# Patient Record
Sex: Female | Born: 1940 | Race: Black or African American | Hispanic: No | Marital: Single | Smoking: Former smoker
Health system: Southern US, Community
[De-identification: ages and names within clinical notes are randomized; demographics above are authoritative.]

## PROBLEM LIST (undated history)

## (undated) DIAGNOSIS — I5022 Chronic systolic (congestive) heart failure: Secondary | ICD-10-CM

## (undated) DIAGNOSIS — I618 Other nontraumatic intracerebral hemorrhage: Secondary | ICD-10-CM

## (undated) DIAGNOSIS — Z95810 Presence of automatic (implantable) cardiac defibrillator: Secondary | ICD-10-CM

## (undated) DIAGNOSIS — I639 Cerebral infarction, unspecified: Secondary | ICD-10-CM

## (undated) DIAGNOSIS — E785 Hyperlipidemia, unspecified: Secondary | ICD-10-CM

## (undated) DIAGNOSIS — I428 Other cardiomyopathies: Secondary | ICD-10-CM

## (undated) DIAGNOSIS — I10 Essential (primary) hypertension: Secondary | ICD-10-CM

## (undated) DIAGNOSIS — M8580 Other specified disorders of bone density and structure, unspecified site: Secondary | ICD-10-CM

## (undated) DIAGNOSIS — F0390 Unspecified dementia without behavioral disturbance: Secondary | ICD-10-CM

## (undated) DIAGNOSIS — R51 Headache: Secondary | ICD-10-CM

## (undated) DIAGNOSIS — IMO0001 Reserved for inherently not codable concepts without codable children: Secondary | ICD-10-CM

## (undated) DIAGNOSIS — I219 Acute myocardial infarction, unspecified: Secondary | ICD-10-CM

## (undated) HISTORY — PX: ABDOMINAL HYSTERECTOMY: SHX81

## (undated) HISTORY — DX: Hyperlipidemia, unspecified: E78.5

## (undated) HISTORY — DX: Presence of automatic (implantable) cardiac defibrillator: Z95.810

## (undated) HISTORY — PX: CARDIAC CATHETERIZATION: SHX172

## (undated) HISTORY — PX: BACK SURGERY: SHX140

---

## 1989-01-01 DIAGNOSIS — I219 Acute myocardial infarction, unspecified: Secondary | ICD-10-CM

## 1989-01-01 HISTORY — DX: Acute myocardial infarction, unspecified: I21.9

## 1990-01-01 DIAGNOSIS — I618 Other nontraumatic intracerebral hemorrhage: Secondary | ICD-10-CM

## 1990-01-01 HISTORY — DX: Other nontraumatic intracerebral hemorrhage: I61.8

## 2001-12-21 ENCOUNTER — Inpatient Hospital Stay (HOSPITAL_COMMUNITY): Admission: AD | Admit: 2001-12-21 | Discharge: 2001-12-22 | Payer: Self-pay | Admitting: Cardiology

## 2001-12-21 HISTORY — PX: CARDIAC CATHETERIZATION: SHX172

## 2002-11-15 ENCOUNTER — Inpatient Hospital Stay (HOSPITAL_COMMUNITY): Admission: EM | Admit: 2002-11-15 | Discharge: 2002-11-19 | Payer: Self-pay | Admitting: *Deleted

## 2008-08-01 HISTORY — PX: NM MYOCAR PERF WALL MOTION: HXRAD629

## 2008-08-05 ENCOUNTER — Encounter (HOSPITAL_COMMUNITY): Admission: RE | Admit: 2008-08-05 | Discharge: 2008-11-03 | Payer: Self-pay | Admitting: Cardiovascular Disease

## 2008-09-16 ENCOUNTER — Inpatient Hospital Stay (HOSPITAL_COMMUNITY): Admission: EM | Admit: 2008-09-16 | Discharge: 2008-09-23 | Payer: Self-pay | Admitting: Emergency Medicine

## 2008-09-17 ENCOUNTER — Encounter (INDEPENDENT_AMBULATORY_CARE_PROVIDER_SITE_OTHER): Payer: Self-pay | Admitting: Internal Medicine

## 2008-09-21 HISTORY — PX: CARDIAC CATHETERIZATION: SHX172

## 2008-09-30 ENCOUNTER — Emergency Department (HOSPITAL_COMMUNITY): Admission: EM | Admit: 2008-09-30 | Discharge: 2008-09-30 | Payer: Self-pay | Admitting: Emergency Medicine

## 2008-11-29 ENCOUNTER — Emergency Department (HOSPITAL_COMMUNITY): Admission: EM | Admit: 2008-11-29 | Discharge: 2008-11-29 | Payer: Self-pay | Admitting: Emergency Medicine

## 2009-01-13 ENCOUNTER — Emergency Department (HOSPITAL_COMMUNITY): Admission: EM | Admit: 2009-01-13 | Discharge: 2009-01-13 | Payer: Self-pay | Admitting: Emergency Medicine

## 2009-07-26 ENCOUNTER — Encounter: Admission: RE | Admit: 2009-07-26 | Discharge: 2009-07-26 | Payer: Self-pay | Admitting: Internal Medicine

## 2010-01-22 ENCOUNTER — Encounter: Payer: Self-pay | Admitting: Internal Medicine

## 2010-01-23 ENCOUNTER — Encounter: Payer: Self-pay | Admitting: Cardiovascular Disease

## 2010-03-19 LAB — COMPREHENSIVE METABOLIC PANEL
ALT: 165 U/L — ABNORMAL HIGH (ref 0–35)
AST: 24 U/L (ref 0–37)
Albumin: 3.7 g/dL (ref 3.5–5.2)
Alkaline Phosphatase: 73 U/L (ref 39–117)
BUN: 10 mg/dL (ref 6–23)
CO2: 23 mEq/L (ref 19–32)
Calcium: 9.3 mg/dL (ref 8.4–10.5)
Chloride: 102 mEq/L (ref 96–112)
Creatinine, Ser: 0.77 mg/dL (ref 0.4–1.2)
GFR calc Af Amer: >60 mL/min (ref >60)
GFR calc non Af Amer: >60 mL/min (ref >60)
Glucose, Bld: 124 mg/dL — ABNORMAL HIGH (ref 70–99)
Potassium: 3.6 mEq/L (ref 3.5–5.1)
Sodium: 133 mEq/L — ABNORMAL LOW (ref 135–145)
Total Bilirubin: 0.7 mg/dL (ref 0.3–1.2)
Total Protein: 6.8 g/dL (ref 6.0–8.3)

## 2010-03-19 LAB — HEPATITIS PANEL, ACUTE
HCV Ab: NEGATIVE
Hep A IgM: NEGATIVE
Hep B C IgM: NEGATIVE
Hepatitis B Surface Ag: NEGATIVE

## 2010-03-19 LAB — PROTIME-INR
INR: 1.04 (ref 0.00–1.49)
Prothrombin Time: 13.5 seconds (ref 11.6–15.2)

## 2010-03-19 LAB — ACETAMINOPHEN LEVEL: Acetaminophen (Tylenol), Serum: <10 ug/mL — ABNORMAL LOW (ref 10–30)

## 2010-04-05 LAB — DIFFERENTIAL
Basophils Absolute: 0.1 10*3/uL (ref 0.0–0.1)
Basophils Relative: 2 % — ABNORMAL HIGH (ref 0–1)
Eosinophils Absolute: 0.1 10*3/uL (ref 0.0–0.7)
Eosinophils Relative: 2 % (ref 0–5)
Lymphocytes Relative: 34 % (ref 12–46)
Lymphs Abs: 1.9 10*3/uL (ref 0.7–4.0)
Monocytes Absolute: 0.5 10*3/uL (ref 0.1–1.0)
Monocytes Relative: 9 % (ref 3–12)
Neutro Abs: 3.1 10*3/uL (ref 1.7–7.7)
Neutrophils Relative %: 53 % (ref 43–77)

## 2010-04-05 LAB — COMPREHENSIVE METABOLIC PANEL
ALT: 13 U/L (ref 0–35)
AST: 17 U/L (ref 0–37)
Albumin: 3.9 g/dL (ref 3.5–5.2)
Alkaline Phosphatase: 61 U/L (ref 39–117)
BUN: 12 mg/dL (ref 6–23)
CO2: 28 mEq/L (ref 19–32)
Calcium: 9.8 mg/dL (ref 8.4–10.5)
Chloride: 101 mEq/L (ref 96–112)
Creatinine, Ser: 1.05 mg/dL (ref 0.4–1.2)
GFR calc Af Amer: >60 mL/min (ref >60)
GFR calc non Af Amer: 52 mL/min — ABNORMAL LOW (ref >60)
Glucose, Bld: 79 mg/dL (ref 70–99)
Potassium: 4.4 mEq/L (ref 3.5–5.1)
Sodium: 135 mEq/L (ref 135–145)
Total Bilirubin: 0.4 mg/dL (ref 0.3–1.2)
Total Protein: 7.2 g/dL (ref 6.0–8.3)

## 2010-04-05 LAB — GLUCOSE, CAPILLARY: Glucose-Capillary: 80 mg/dL (ref 70–99)

## 2010-04-05 LAB — POCT CARDIAC MARKERS
CKMB, poc: 1.5 ng/mL (ref 1.0–8.0)
CKMB, poc: 1.9 ng/mL (ref 1.0–8.0)
Myoglobin, poc: 64.9 ng/mL (ref 12–200)
Myoglobin, poc: 71.3 ng/mL (ref 12–200)
Troponin i, poc: <0.05 ng/mL (ref 0.00–0.09)
Troponin i, poc: <0.05 ng/mL (ref 0.00–0.09)

## 2010-04-05 LAB — CBC
HCT: 37.5 % (ref 36.0–46.0)
Hemoglobin: 12.4 g/dL (ref 12.0–15.0)
MCHC: 33.1 g/dL (ref 30.0–36.0)
MCV: 87.3 fL (ref 78.0–100.0)
Platelets: 232 10*3/uL (ref 150–400)
RBC: 4.3 MIL/uL (ref 3.87–5.11)
RDW: 14.3 % (ref 11.5–15.5)
WBC: 5.7 10*3/uL (ref 4.0–10.5)

## 2010-04-07 LAB — CBC
HCT: 35.5 % — ABNORMAL LOW (ref 36.0–46.0)
HCT: 36.4 % (ref 36.0–46.0)
HCT: 38 % (ref 36.0–46.0)
HCT: 41 % (ref 36.0–46.0)
Hemoglobin: 11.5 g/dL — ABNORMAL LOW (ref 12.0–15.0)
Hemoglobin: 12.5 g/dL (ref 12.0–15.0)
Hemoglobin: 13.2 g/dL (ref 12.0–15.0)
MCHC: 32.2 g/dL (ref 30.0–36.0)
MCHC: 32.2 g/dL (ref 30.0–36.0)
MCHC: 32.7 g/dL (ref 30.0–36.0)
MCV: 88 fL (ref 78.0–100.0)
MCV: 88.9 fL (ref 78.0–100.0)
MCV: 88.9 fL (ref 78.0–100.0)
MCV: 89.9 fL (ref 78.0–100.0)
Platelets: 190 10*3/uL (ref 150–400)
Platelets: 197 10*3/uL (ref 150–400)
Platelets: 241 10*3/uL (ref 150–400)
RBC: 3.97 MIL/uL (ref 3.87–5.11)
RBC: 4.52 MIL/uL (ref 3.87–5.11)
RBC: 4.56 MIL/uL (ref 3.87–5.11)
RBC: 4.6 MIL/uL (ref 3.87–5.11)
RDW: 15.6 % — ABNORMAL HIGH (ref 11.5–15.5)
RDW: 15.7 % — ABNORMAL HIGH (ref 11.5–15.5)
RDW: 16 % — ABNORMAL HIGH (ref 11.5–15.5)
WBC: 5.3 10*3/uL (ref 4.0–10.5)
WBC: 5.3 10*3/uL (ref 4.0–10.5)
WBC: 5.5 10*3/uL (ref 4.0–10.5)
WBC: 6 10*3/uL (ref 4.0–10.5)
WBC: 6.1 10*3/uL (ref 4.0–10.5)

## 2010-04-07 LAB — GLUCOSE, CAPILLARY
Glucose-Capillary: 106 mg/dL — ABNORMAL HIGH (ref 70–99)
Glucose-Capillary: 116 mg/dL — ABNORMAL HIGH (ref 70–99)
Glucose-Capillary: 119 mg/dL — ABNORMAL HIGH (ref 70–99)
Glucose-Capillary: 121 mg/dL — ABNORMAL HIGH (ref 70–99)
Glucose-Capillary: 121 mg/dL — ABNORMAL HIGH (ref 70–99)
Glucose-Capillary: 126 mg/dL — ABNORMAL HIGH (ref 70–99)
Glucose-Capillary: 128 mg/dL — ABNORMAL HIGH (ref 70–99)
Glucose-Capillary: 135 mg/dL — ABNORMAL HIGH (ref 70–99)
Glucose-Capillary: 88 mg/dL (ref 70–99)
Glucose-Capillary: 92 mg/dL (ref 70–99)
Glucose-Capillary: 92 mg/dL (ref 70–99)

## 2010-04-07 LAB — LIPID PANEL
Cholesterol: 110 mg/dL (ref 0–200)
HDL: 40 mg/dL (ref >39)
HDL: 41 mg/dL (ref >39)
LDL Cholesterol: 54 mg/dL (ref 0–99)
Total CHOL/HDL Ratio: 2.8 RATIO
Triglycerides: 78 mg/dL (ref <150)
VLDL: 16 mg/dL (ref 0–40)
VLDL: 20 mg/dL (ref 0–40)

## 2010-04-07 LAB — DIFFERENTIAL
Basophils Relative: 1 % (ref 0–1)
Eosinophils Absolute: 0 10*3/uL (ref 0.0–0.7)
Lymphocytes Relative: 11 % — ABNORMAL LOW (ref 12–46)
Lymphs Abs: 0.7 10*3/uL (ref 0.7–4.0)
Monocytes Absolute: 0.3 10*3/uL (ref 0.1–1.0)
Monocytes Relative: 4 % (ref 3–12)
Monocytes Relative: 6 % (ref 3–12)
Neutro Abs: 5.2 10*3/uL (ref 1.7–7.7)
Neutrophils Relative %: 60 % (ref 43–77)
Neutrophils Relative %: 85 % — ABNORMAL HIGH (ref 43–77)

## 2010-04-07 LAB — BASIC METABOLIC PANEL
BUN: 15 mg/dL (ref 6–23)
BUN: 17 mg/dL (ref 6–23)
CO2: 23 mEq/L (ref 19–32)
CO2: 23 mEq/L (ref 19–32)
CO2: 24 mEq/L (ref 19–32)
CO2: 25 mEq/L (ref 19–32)
Calcium: 10.2 mg/dL (ref 8.4–10.5)
Calcium: 10.3 mg/dL (ref 8.4–10.5)
Calcium: 10.5 mg/dL (ref 8.4–10.5)
Calcium: 9.5 mg/dL (ref 8.4–10.5)
Chloride: 102 mEq/L (ref 96–112)
Chloride: 104 mEq/L (ref 96–112)
Creatinine, Ser: 0.99 mg/dL (ref 0.4–1.2)
Creatinine, Ser: 1.09 mg/dL (ref 0.4–1.2)
Creatinine, Ser: 1.19 mg/dL (ref 0.4–1.2)
Creatinine, Ser: 1.32 mg/dL — ABNORMAL HIGH (ref 0.4–1.2)
GFR calc Af Amer: 40 mL/min — ABNORMAL LOW (ref >60)
GFR calc Af Amer: 48 mL/min — ABNORMAL LOW (ref >60)
GFR calc Af Amer: 48 mL/min — ABNORMAL LOW (ref >60)
GFR calc Af Amer: 52 mL/min — ABNORMAL LOW (ref >60)
GFR calc Af Amer: 55 mL/min — ABNORMAL LOW (ref >60)
GFR calc non Af Amer: 40 mL/min — ABNORMAL LOW (ref >60)
GFR calc non Af Amer: 43 mL/min — ABNORMAL LOW (ref >60)
GFR calc non Af Amer: 43 mL/min — ABNORMAL LOW (ref >60)
GFR calc non Af Amer: 50 mL/min — ABNORMAL LOW (ref >60)
GFR calc non Af Amer: 56 mL/min — ABNORMAL LOW (ref >60)
Glucose, Bld: 100 mg/dL — ABNORMAL HIGH (ref 70–99)
Glucose, Bld: 109 mg/dL — ABNORMAL HIGH (ref 70–99)
Glucose, Bld: 152 mg/dL — ABNORMAL HIGH (ref 70–99)
Potassium: 3.7 mEq/L (ref 3.5–5.1)
Potassium: 4.3 mEq/L (ref 3.5–5.1)
Potassium: 5.1 mEq/L (ref 3.5–5.1)
Sodium: 133 mEq/L — ABNORMAL LOW (ref 135–145)
Sodium: 133 mEq/L — ABNORMAL LOW (ref 135–145)
Sodium: 136 mEq/L (ref 135–145)
Sodium: 136 mEq/L (ref 135–145)
Sodium: 136 mEq/L (ref 135–145)
Sodium: 137 mEq/L (ref 135–145)

## 2010-04-07 LAB — MAGNESIUM: Magnesium: 2.7 mg/dL — ABNORMAL HIGH (ref 1.5–2.5)

## 2010-04-07 LAB — POCT I-STAT, CHEM 8
Calcium, Ion: 1.12 mmol/L (ref 1.12–1.32)
Glucose, Bld: 117 mg/dL — ABNORMAL HIGH (ref 70–99)
HCT: 44 % (ref 36.0–46.0)
Hemoglobin: 15 g/dL (ref 12.0–15.0)

## 2010-04-07 LAB — URINALYSIS, ROUTINE W REFLEX MICROSCOPIC
Glucose, UA: NEGATIVE mg/dL
Hgb urine dipstick: NEGATIVE
Specific Gravity, Urine: 1.014 (ref 1.005–1.030)
Urobilinogen, UA: 0.2 mg/dL (ref 0.0–1.0)
pH: 5 (ref 5.0–8.0)

## 2010-04-07 LAB — BRAIN NATRIURETIC PEPTIDE
Pro B Natriuretic peptide (BNP): 1109 pg/mL — ABNORMAL HIGH (ref 0.0–100.0)
Pro B Natriuretic peptide (BNP): 148 pg/mL — ABNORMAL HIGH (ref 0.0–100.0)
Pro B Natriuretic peptide (BNP): 1758 pg/mL — ABNORMAL HIGH (ref 0.0–100.0)

## 2010-04-07 LAB — URINE MICROSCOPIC-ADD ON

## 2010-04-07 LAB — APTT: aPTT: 29 seconds (ref 24–37)

## 2010-04-07 LAB — CARDIAC PANEL(CRET KIN+CKTOT+MB+TROPI)
Troponin I: 0.23 ng/mL — ABNORMAL HIGH (ref 0.00–0.06)
Troponin I: 0.24 ng/mL — ABNORMAL HIGH (ref 0.00–0.06)

## 2010-04-07 LAB — PROTIME-INR: INR: 1.1 (ref 0.00–1.49)

## 2010-04-07 LAB — TSH: TSH: 2.363 u[IU]/mL (ref 0.350–4.500)

## 2010-04-07 LAB — URINE CULTURE

## 2010-04-07 LAB — POCT CARDIAC MARKERS: Myoglobin, poc: 50.6 ng/mL (ref 12–200)

## 2010-04-12 ENCOUNTER — Other Ambulatory Visit: Payer: Self-pay | Admitting: Internal Medicine

## 2010-04-12 DIAGNOSIS — R0989 Other specified symptoms and signs involving the circulatory and respiratory systems: Secondary | ICD-10-CM

## 2010-04-17 ENCOUNTER — Other Ambulatory Visit: Payer: Self-pay

## 2010-04-17 ENCOUNTER — Ambulatory Visit
Admission: RE | Admit: 2010-04-17 | Discharge: 2010-04-17 | Disposition: A | Discharge status: Home / Self Care | Payer: Medicare Other | Source: Ambulatory Visit | Attending: Internal Medicine | Admitting: Internal Medicine

## 2010-04-17 DIAGNOSIS — R0989 Other specified symptoms and signs involving the circulatory and respiratory systems: Secondary | ICD-10-CM

## 2010-05-02 ENCOUNTER — Other Ambulatory Visit: Payer: Self-pay | Admitting: Internal Medicine

## 2010-05-02 DIAGNOSIS — Z780 Asymptomatic menopausal state: Secondary | ICD-10-CM

## 2010-05-02 HISTORY — PX: TRANSTHORACIC ECHOCARDIOGRAM: SHX275

## 2010-05-04 ENCOUNTER — Ambulatory Visit
Admission: RE | Admit: 2010-05-04 | Discharge: 2010-05-04 | Disposition: A | Discharge status: Home / Self Care | Payer: Medicare Other | Source: Ambulatory Visit | Attending: Internal Medicine | Admitting: Internal Medicine

## 2010-05-04 DIAGNOSIS — Z780 Asymptomatic menopausal state: Secondary | ICD-10-CM

## 2010-05-08 ENCOUNTER — Emergency Department (HOSPITAL_COMMUNITY)
Admission: EM | Admit: 2010-05-08 | Discharge: 2010-05-08 | Disposition: A | Discharge status: Home / Self Care | Payer: Medicare Other | Attending: Emergency Medicine | Admitting: Emergency Medicine

## 2010-05-08 ENCOUNTER — Emergency Department (HOSPITAL_COMMUNITY): Payer: Medicare Other

## 2010-05-08 DIAGNOSIS — E119 Type 2 diabetes mellitus without complications: Secondary | ICD-10-CM | POA: Insufficient documentation

## 2010-05-08 DIAGNOSIS — I10 Essential (primary) hypertension: Secondary | ICD-10-CM | POA: Insufficient documentation

## 2010-05-08 DIAGNOSIS — I252 Old myocardial infarction: Secondary | ICD-10-CM | POA: Insufficient documentation

## 2010-05-08 DIAGNOSIS — R0602 Shortness of breath: Secondary | ICD-10-CM | POA: Insufficient documentation

## 2010-05-08 DIAGNOSIS — F29 Unspecified psychosis not due to a substance or known physiological condition: Secondary | ICD-10-CM | POA: Insufficient documentation

## 2010-05-08 DIAGNOSIS — F0280 Dementia in other diseases classified elsewhere without behavioral disturbance: Secondary | ICD-10-CM | POA: Insufficient documentation

## 2010-05-08 DIAGNOSIS — G309 Alzheimer's disease, unspecified: Secondary | ICD-10-CM | POA: Insufficient documentation

## 2010-05-08 DIAGNOSIS — I509 Heart failure, unspecified: Secondary | ICD-10-CM | POA: Insufficient documentation

## 2010-05-08 LAB — CBC
HCT: 38.8 % (ref 36.0–46.0)
Hemoglobin: 12.5 g/dL (ref 12.0–15.0)
MCH: 28.3 pg (ref 26.0–34.0)
MCHC: 32.2 g/dL (ref 30.0–36.0)
MCV: 87.8 fL (ref 78.0–100.0)

## 2010-05-08 LAB — BASIC METABOLIC PANEL
BUN: 10 mg/dL (ref 6–23)
CO2: 23 mEq/L (ref 19–32)
Calcium: 9.7 mg/dL (ref 8.4–10.5)
Creatinine, Ser: 1.03 mg/dL (ref 0.4–1.2)
Glucose, Bld: 198 mg/dL — ABNORMAL HIGH (ref 70–99)

## 2010-05-08 LAB — DIFFERENTIAL
Basophils Relative: 1 % (ref 0–1)
Eosinophils Relative: 2 % (ref 0–5)
Lymphs Abs: 2.1 10*3/uL (ref 0.7–4.0)
Monocytes Absolute: 0.5 10*3/uL (ref 0.1–1.0)

## 2010-05-08 LAB — POCT CARDIAC MARKERS: Myoglobin, poc: 75 ng/mL (ref 12–200)

## 2010-05-19 NOTE — Cardiovascular Report (Signed)
   NAME:  Paula [name], Paula [name]                       ACCOUNT NO.:  [id number]   MEDICAL RECORD NO.:  [id number]                   PATIENT TYPE:  INP   LOCATION:  2017                                 FACILITY:  [facility]   PHYSICIAN:  Rollene Rotunda, M.D. [facility]            DATE OF BIRTH:  March 15, 1940   DATE OF PROCEDURE:  12/21/2001  DATE OF DISCHARGE:                              CARDIAC CATHETERIZATION   DATE OF BIRTH:  April 01, 1940   PRIMARY CARE PHYSICIAN:  Elease Hashimoto A. Benedetto Goad, M.D.   PROCEDURE:  Left heart catheterization/coronary arteriography.   INDICATIONS:  Evaluate patient with an abnormal Cardiolite suggesting a  reduced EF, left ventricular dilatation, and perhaps global ischemia.   DESCRIPTION OF PROCEDURE:  Left heart catheterization was performed via the  right femoral artery. The vessel was cannulated using the anterior wall  puncture. A #6 French arterial sheath was inserted via the modified  Seldinger technique. Preformed Judkins and a pigtail catheter were utilized.  The patient tolerated the procedure well and left the lab in stable  condition.   HEMODYNAMICS:  LV 177/12, AO 167/91.   CORONARY ARTERIES:  The left main was normal.   The LAD was normal.   The circumflex had proximal luminal irregularities.  The vessel was somewhat  small in the AV groove.  There was a large ramus intermediate.   The right coronary artery was a dominant vessel.  There were diffuse luminal  irregularities.   LEFT VENTRICULOGRAM: The left ventriculogram was obtained in the RAO  projection. The EF was approximately 60%.  I was unable, however, to  adequately estimate this secondary to significant ventricular ectopy.   CONCLUSION:  Minimal coronary artery plaquing.  Well preserved left  ventricular function, probably.    PLAN:  I discussed this with Dr. Benedetto Goad.  She will relay the results to Dr.  Sherril Croon.  I would suggest and echocardiogram per Dr. Sherril Croon to further estimate  the  ejection fraction as I was unable to clarify whether the EF is normal or  slightly reduced with the left ventriculogram.                                                     Rollene Rotunda, M.D. [facility]    JH/MEDQ  D:  12/22/2001  T:  12/23/2001  Job:  [id number]   cc:   Elease Hashimoto A. Benedetto Goad, M.D.  [address]  [place]  [place] [address]  Fax: [phone]

## 2010-05-19 NOTE — Discharge Summary (Signed)
NAME:  Paula [name], Paula [name]                       ACCOUNT NO.:  [id number]   MEDICAL RECORD NO.:  [id number]                   PATIENT TYPE:  INP   LOCATION:  3017                                 FACILITY:  [facility]   PHYSICIAN:  Madaline Guthrie, M.D.                 DATE OF BIRTH:  21-Nov-1940   DATE OF ADMISSION:  11/15/2002  DATE OF DISCHARGE:  11/19/2002                                 DISCHARGE SUMMARY   ADMITTING PHYSICIAN:  Madaline Guthrie, M.D.   DISCHARGE DIAGNOSES:  Altered mental status.   PAST MEDICAL HISTORY:  1. 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  2. Diabetes mellitus type 2.  3. Hypertension.  4. Hyperlipidemia.  5. CAD, abnormal Cardiolite in December 2003.  6. History of back surgery.  7. Status post hysterectomy.   DISCHARGE MEDICATIONS:  1. Amaryl 2 mg p.o. daily.  2. Avapro 300 mg p.o. daily.  3. Norvasc 5 mg p.o. daily.  4. Toprol XL 200 mg p.o. daily.  5. Protonix 40 mg p.o. daily.  6. Zocor 40 mg p.o. daily.  7. Zyprexa 5 mg p.o. q.h.s.   FOLLOWUP:  Follow up with Maisie Fus, M.D. on November 23 at 11:00.  Phone  number is [phone].   CONDITION ON DISCHARGE:  Stable, much improved.   DISCHARGE INSTRUCTIONS:  Return to emergency room if you re-experience  confusion or disorientation.   HISTORY OF PRESENT ILLNESS:  Ms. [place] is a 70 year old African-American  female who presented to the emergency department after a three day period of  confusion.  The patient was brought to ED by her daughter who states her  mother was fine prior to this episode without any periods of forgetfulness  or disorientation.  The patient's son found the patient in her home writing  on the walls and furniture with a felt tip pen and speaking nonsense.  Unable to obtain history from patient due to extreme tangentiality of speech  and flight of  ideas.  Daughter reports no preceding fever or illness in  patient.   PHYSICAL EXAMINATION:  VITAL SIGNS:  Presenting  vital signs:  Pulse 105,  blood pressure 153/86, temperature 86.7, respirations 22, O2 saturation 95%  on room air.  HEENT:  Pupils are equal, round, and reactive to light.  Sclerae injected  bilaterally.  Oropharynx clear.  NECK:  Supple.  Full range of movement.  LUNGS:  Clear to auscultation bilaterally.  No use of accessory muscles.  HEART:  Regular rate and rhythm.  Mild tachycardia.  No murmurs or rubs.  ABDOMEN:  Positive bowel sounds, soft, nontender, nondistended.  No masses.  EXTREMITIES:  Trace edema bilaterally at the lower extremities, nonpitting.  SKIN:  Warm, dry, without rash or lesions.  LYMPH:  No submandibular or cervical lymphadenopathy.  MUSCULOSKELETAL:  Strength 5+/5+ throughout.  NEUROLOGIC:  2+ bilateral biceps DTRs and symmetric.  Cranial nerves II-XII  grossly intact.  No focal  deficits.  PSYCHIATRIC:  Labile effect.  Agitated.  Speech was normal rate, rhythm, and  tone.  Thought perception was nonlogical, nonlinear, tangential with flight  of ideas.   LABORATORIES:  Bilirubin 0.5, alkaline phosphatase 65, AST 28, ALT 21,  protein 8.2, albumin 4.5, calcium 10.3.  Sodium 136, potassium 3.6, chloride  99, bicarbonate 27, BUN 10, creatinine 0.8, glucose 155.  WBC 6.5,  hemoglobin 13.4, hematocrit 40.6, platelets 246,000.  Urinalysis showed  ketones 40.  Nitrites, blood, LE, glucose all negative.  Specific gravity  1.014.  EKG with sinus tachycardia with heart rate 107, ERI 183, QRS 100,  QTC 342/456, PRT 87/12/151.  Showed left hypertrophy and inverted T-waves in  V5 and V6.   PROCEDURE:  1. CT of the head without contrast.  Impression:  Small vessel disease.  No     lacunes.  No acute findings.  This was on November 15, 2002.  2. Portable chest November 15, 2002 showed mild cardiomegaly with no active     disease.  3. MRI/MRA of the brain.  Impression:  Chronic small vessel disease without     evidence of acute or reversible process.  All major intracranial  vessels     are patent.  Poor detail due to patient motion.   CONSULTS:  Psychiatry.  Impression:  Delirium, functional psychosis.  Recommendations:  Continue Zyprexa with psychiatric follow-up as psychosis  returns.   HOSPITAL COURSE:  Problem 1 - ALTERED MENTAL STATUS:  The patient presented  disoriented, confused, and agitated with nonlinear, nonlogical thought.  Investigated acute cerebral involvement with a CT and MRI/MRA of the head  which all proved negative.  The patient was ruled out for MRI.  Serial  enzymes x3 were negative.  The patient was placed on cardiac monitor for  several days with no acute events and no acute changes in EKG.  The patient  remained afebrile with no signs or symptoms of infectious disease throughout  her course.  The patient's mental status began clearing daily upon admission  to the hospital.  Zyprexa was started and by the time of discharge patient  was completely oriented and logical with no remaining psychotic features.   Problem 2 - HYPERTENSION:  This was controlled with Avapro and Norvasc and  Toprol.  The patient's blood pressure was within normal limits at discharge.   Problem 3 - DIABETES:  Controlled.  The patient on Amaryl.  The patient was  discharged.   Problem 4 - HYPOKALEMIA:  Controlled.  The patient on K-Dur 40 mg p.r.n.   DISCHARGE LABORATORIES:  Sodium 133, potassium 3.9, chloride 101,  bicarbonate 22, BUN 9, creatinine 0.8, glucose 148.  WBC 6.9, hemoglobin  13.1, hematocrit 39.7, platelets 295,000.   DISCHARGE VITAL SIGNS:  Temperature 99.1, pulse 83, respirations 20, blood  pressure 148/82, SAO2 91% on room air.      Cristie Hem, MS-4                        Madaline Guthrie, M.D.    TK/MEDQ  D:  11/19/2002  T:  11/20/2002  Job:  [id number]   cc:   Maisie Fus  [address]  [place]  [id number]  Fax: [phone]

## 2010-05-19 NOTE — Discharge Summary (Signed)
NAME:  Paula [name], Paula [name]                       ACCOUNT NO.:  [id number]   MEDICAL RECORD NO.:  [id number]                   PATIENT TYPE:  INP   LOCATION:  2017                                 FACILITY:  [facility]   PHYSICIAN:  Rollene Rotunda, M.D. [facility]            DATE OF BIRTH:  17-Nov-1940   DATE OF ADMISSION:  12/21/2001  DATE OF DISCHARGE:  12/22/2001                           DISCHARGE SUMMARY - REFERRING   BRIEF HISTORY:  This is a 70 year old female admitted to [facility]  12/21/2001 for evaluation of coronary artery disease.  The patient was in  her usual state of health until 12/17/2001 when an aide came by to help her  get out of bed.  The patient slumped to the floor and could not get up at  first. She had no chest pain, shortness of breath, palpitations, or  headache.  The patient was taken to the emergency room for further  evaluation.  She ruled out for an MI.  She had a Cardiolite that was  positive for ischemia in the anterolateral distribution.  She was  transferred to [facility] for further evaluation and cardiac  catheterization.  She was initially seen at [facility].   PAST MEDICAL HISTORY:  1. The patient had a cerebral bleed in 1992 without sequelae.  2. History of hypertension.  3. Borderline diabetes.  4. History of tobacco use.  5. History of hypertension.   ALLERGIES:  No known drug allergies.   MEDICATIONS PRIOR TO ADMISSION:  1. Premarin 0.625 mg daily.  2. Divan 160/25 once daily.  3. Allegra 60 mg daily.  4. Toprol XL 200 mg daily.  5. Ultracet p.r.n.   HOSPITAL COURSE:  As noted, this patient was admitted to [facility]  from [facility] following an abnormal Cardiolite. The plan was for  cardiac catheterization.   The patient underwent cardiac catheterization on 12/22/2001 performed by Dr.  Antoine Poche.  The patient was found to have a normal left ventricle.  The  coronary arteries were essentially normal.   Ejection fraction was estimated  to be 60%.  It was felt that she had minimal coronary artery plaque, and  medical management was recommended.  An outpatient echo was also recommended  to further evaluate her left ventricle and ejection fraction.   DISCHARGE MEDICATIONS:  1. Toprol XL 150 mg daily.  2. Diovan/HCTZ 160/12.5 one twice daily.  3. Premarin as previously taken  4. Amaryl 2 mg daily for blood glucose.  5. Aspirin 81 mg daily.  6. Allegra as previously taken.  7. Ultracet as previously taken.  8. Zocor 40 mg each evening.  9. Protonix 40 mg daily.   The Amaryl, Zocor, and Protonix were new.  The Toprol was decreased from 200  mg daily to 150 mg daily.  The Diovan was increased to b.i.d.   ACTIVITY:  The patient was told to avoid any strenuous activity or driving  for at least two days.   DIET:  She was to be on a low-salt, low-fat, no-sweets diet. She was told to  call the office if she had increased pain, swelling, or bleeding from her  groin.  She was to have a cardiac echocardiogram at her next office visit.  She was to follow up with Dr. Antoine Poche in approximately two weeks.  She was  to follow up with Dr. Benedetto Goad regarding her high blood sugars and  questionable diabetes.   PROBLEM LIST ON DISCHARGE:  1. Abnormal adenosine Cardiolite at [facility].  2. Cardiac catheterization performed 12/22/2001 showing minimal coronary     plaquing with probable normal left ventricle.  3. History of hypertension.  4. History of diabetes mellitus.  5. Remote tobacco history.  6. History of cerebral aneurysm with bleed in 1992.   RECOMMENDATIONS:  Outpatient echocardiogram recommended at the next office  visit along with further evaluation by primary care physician regarding her  glucose.   The plan at this time is to discharge the patient this evening after her bed  rest is up if she is stable.     Delton See, P.A. [facility]                  Rollene Rotunda, M.D.  [facility]    DR/MEDQ  D:  12/22/2001  T:  12/22/2001  Job:  [id number]   cc:   Elease Hashimoto A. Benedetto Goad, M.D.  [address]  [place]  [place] [address]  Fax: [phone]

## 2010-05-19 NOTE — H&P (Signed)
NAME:  Paula [name], Paula [name] [name] NO.:  [id number]   MEDICAL RECORD NO.:  [id number]                   PATIENT TYPE:  INP   LOCATION:  2017                                 FACILITY:  [facility]   PHYSICIAN:  Arvilla Meres, M.D. [facility]          DATE OF BIRTH:  1940/03/19   DATE OF ADMISSION:  12/21/2001  DATE OF DISCHARGE:                                HISTORY & PHYSICAL   I have seen this patient in conjunction with Paula [name] Paula [name] [name], our P.A., and agree  with his assessment.  Briefly, Paula [name] Paula [name] [name] is a very pleasant, 70-year-  old, black female with a long history of hypertension, tobacco use, obesity  and recently diagnosed diabetes who was transferred from [facility]  for cardiac catheterization after an abnormal Cardiolite.   She reports to me that she had a myocardial infarction in 1991 with  associated severe chest pain.  She was admitted for one week at [facility].  She denies cardiac catheterization or other intervention at that time;  however, she is a bit of a difficult historian.  In 1992, she was also  admitted to [facility] for a cerebral bleed secondary to an aneurysm,  presumably from her hypertension.   Since that time, she reports she has done rather well.  She lives with her  son and is able to do all her ADLs including shopping, driving and working  in the yard.  She does get some help from an aid who comes three times a  week.  In pressing her, she does report that she has occasional chest pain  pressure radiating to her neck and right arm with working in her yard or  other increased activity or emotional stress.   On this past Wednesday, she went to see her primary care doctor for routine  check up.  She was feeling well at that time, but her blood pressure was  elevated.  Her doctor increased her Toprol from 100 mg to 200 mg a day.  The  next morning, she felt very weak and unable to get out of bed.  She denied  any associated chest pain,  shortness of breath, fever, chills, nausea,  vomiting, palpitations, loss of consciousness or focal neuro symptoms.  This  episode lasted about 15-20 minutes and resolved.  She was taken to [facility] where she ruled out for an MI.  She had a Cardiolite which showed  transient ischemic dilatation of her LV with anterior and inferior ischemia.  There were no reported EKG changes.  She was transferred for cardiac  catheterization.   This evening, she feels well, she denies any recent chest pain or shortness  of breath.  Of note, she also denies any melena or bright red blood per  rectum or dysuria.   PAST MEDICAL HISTORY:  1. Hypertension times 30 years.  2. Questionable history of CAD, status post reported MI in 48.  Denies  cardiac catheterization or other intervention at that time.  3. Recently diagnosed diabetes.  4. Obesity.  5. Tobacco use, ongoing.  6. Cerebral hemorrhage secondary to an aneurysm in 1992.  7. Status post total abdominal hysterectomy.   MEDICATIONS:  Toprol XL 200, Diovan HCT 160/25, Premarin, Ultracet and  Allegra.   ALLERGIES:  She has no known drug allergies.   SOCIAL HISTORY:  She lives in [place] with her son, she has a nurse's aid  some in with her three times a week.  She is retired.  She previously was a  nurse's aid herself.  Tobacco; she smokes a half pack a day times 20 years,  she denies any recent alcohol.  She admits that she used to smoke marijuana  but quit in February of this year.   FAMILY HISTORY:  Her mother is alive and well, has a history of stroke.  Her  father is deceased, was reportedly murdered.  She has 11 siblings.   PHYSICAL EXAMINATION:  GENERAL:  She has somewhat of an odd affect but very  pleasant.  She is alert and oriented times three.  She can tell me the  President but has some difficulty with recall of this.  VITAL SIGNS:  Temperature is 36.1, blood pressure 158/78, heart rate 65, she  is saturating 94% on  room air.  NECK:  Supple.  Her JVP is flat, her carotids are 2+ bilaterally without  bruits.  HEENT:  Sclerae are anicteric.  CARDIAC:  She has a regular rate and rhythm with a normal S1 and S2, +S4.  She has no murmur.  LUNGS:  Have some faint crackles at the left base, but otherwise are clear.  ABDOMEN:  Obese, there is no hepatosplenomegaly or bruits.  EXTREMITIES:  No cyanosis, clubbing or edema. Her femoral pulses are 2+  bilaterally without bruits.  Her DP pulses are 1+ bilaterally.  NEUROLOGICAL:  Totally normal.  Her cranial nerves II through XII are  intact.  She has no pronator drift or facial droop.  Her finger-to-nose exam  is normal.  She moves all four extremities well.  Her Babinski's are down  bilaterally.   LABORATORY DATA:  From the 18th at [facility] show a white count of 5.4,  hematocrit of 40 and platelets fo 262.  Sodium is 136, potassium is 3.8, BUN  13, creatinine 0.9, glucose is 126.  Her LFTs are normal.  Her CK MB and  troponins were negative times three.  Her UA was negative.  Her BNP level  was 55.  Her PT INR is 10.6 and 1.0, her PTT was 29.  Her EKG from [facility],  there is one that showed sinus bradycardia at 54 with LVH with  repolarization changes and poor R-wave progression anteriorly.  Number two  from the baseline EKG on her stress test showed sinus bradycardia of 51, LVH  with repolarization but clearly with anteroseptal Q-waves.   ASSESSMENT/PLAN:  Paula [name] Paula [name] [name] is a very pleasant, 70 year old, black  female as above with reported history of MI and multiple cardiac risk  factors.  She was admitted to [facility] with a weak spell which was likely  related to her increased Beta blocker dose; however, a subsequent Cardiolite  shows an impressive ischemia by report, and she does have mild anginal  symptoms as well as an abnormal EKG, thus I agree with the plan to proceed with cardiac catheterization.  For now, will do the following;  1. Will continue her  Diovan and  decrease her Beta blocker.  2. Will start on aspirin.  3. Add a low dose amlodipine for her hypertension.  4. Check fasting lipids, hemoglobin A1c and thyroid panel.  5. Will stop her Premarin and she has been encouraged also to quit smoking.  6. Will proceed with cardiac catheterization in the a.m.  7. Will also consider low dose nitrates if she does not have any coronary     interventions.  8. Finally, I think we can discontinue her Lovenox as she is ruled out for     an MI and she has no  anginal symptoms currently.  We will treat her with     subcu Heparin for DVT prophylaxis.                                               Arvilla Meres, M.D. [facility]    DB/MEDQ  D:  12/21/2001  T:  12/22/2001  Job:  [id number]

## 2010-05-22 ENCOUNTER — Emergency Department (HOSPITAL_COMMUNITY): Payer: Medicare Other

## 2010-05-22 ENCOUNTER — Inpatient Hospital Stay (HOSPITAL_COMMUNITY)
Admission: EM | Admit: 2010-05-22 | Discharge: 2010-05-31 | DRG: 292 | Disposition: A | Discharge status: Skilled Nursing Facility | Payer: Medicare Other | Attending: Internal Medicine | Admitting: Internal Medicine

## 2010-05-22 DIAGNOSIS — I5043 Acute on chronic combined systolic (congestive) and diastolic (congestive) heart failure: Principal | ICD-10-CM | POA: Diagnosis present

## 2010-05-22 DIAGNOSIS — Z8673 Personal history of transient ischemic attack (TIA), and cerebral infarction without residual deficits: Secondary | ICD-10-CM

## 2010-05-22 DIAGNOSIS — G35 Multiple sclerosis: Secondary | ICD-10-CM | POA: Diagnosis present

## 2010-05-22 DIAGNOSIS — F0390 Unspecified dementia without behavioral disturbance: Secondary | ICD-10-CM | POA: Diagnosis present

## 2010-05-22 DIAGNOSIS — I10 Essential (primary) hypertension: Secondary | ICD-10-CM | POA: Diagnosis present

## 2010-05-22 DIAGNOSIS — E785 Hyperlipidemia, unspecified: Secondary | ICD-10-CM | POA: Diagnosis present

## 2010-05-22 DIAGNOSIS — I4891 Unspecified atrial fibrillation: Secondary | ICD-10-CM | POA: Diagnosis present

## 2010-05-22 DIAGNOSIS — I509 Heart failure, unspecified: Secondary | ICD-10-CM | POA: Diagnosis present

## 2010-05-22 DIAGNOSIS — I428 Other cardiomyopathies: Secondary | ICD-10-CM | POA: Diagnosis present

## 2010-05-22 DIAGNOSIS — I2510 Atherosclerotic heart disease of native coronary artery without angina pectoris: Secondary | ICD-10-CM | POA: Diagnosis present

## 2010-05-22 DIAGNOSIS — E119 Type 2 diabetes mellitus without complications: Secondary | ICD-10-CM | POA: Diagnosis present

## 2010-05-22 DIAGNOSIS — I517 Cardiomegaly: Secondary | ICD-10-CM | POA: Diagnosis present

## 2010-05-22 DIAGNOSIS — Z66 Do not resuscitate: Secondary | ICD-10-CM | POA: Diagnosis present

## 2010-05-22 LAB — URINALYSIS, ROUTINE W REFLEX MICROSCOPIC
Glucose, UA: NEGATIVE mg/dL
Hgb urine dipstick: NEGATIVE
Ketones, ur: NEGATIVE mg/dL
Protein, ur: 100 mg/dL — AB

## 2010-05-22 LAB — DIFFERENTIAL
Eosinophils Relative: 0 % (ref 0–5)
Lymphocytes Relative: 22 % (ref 12–46)
Lymphs Abs: 1.7 10*3/uL (ref 0.7–4.0)
Monocytes Absolute: 0.5 10*3/uL (ref 0.1–1.0)

## 2010-05-22 LAB — CBC
HCT: 36.7 % (ref 36.0–46.0)
MCH: 28.1 pg (ref 26.0–34.0)
MCHC: 32.4 g/dL (ref 30.0–36.0)
MCV: 86.6 fL (ref 78.0–100.0)
RDW: 16.4 % — ABNORMAL HIGH (ref 11.5–15.5)

## 2010-05-22 LAB — URINE MICROSCOPIC-ADD ON

## 2010-05-22 LAB — POCT I-STAT, CHEM 8
Creatinine, Ser: 1.5 mg/dL — ABNORMAL HIGH (ref 0.4–1.2)
Glucose, Bld: 301 mg/dL — ABNORMAL HIGH (ref 70–99)
Hemoglobin: 13.6 g/dL (ref 12.0–15.0)
TCO2: 20 mmol/L (ref 0–100)

## 2010-05-22 LAB — POCT CARDIAC MARKERS
CKMB, poc: <1 ng/mL — ABNORMAL LOW (ref 1.0–8.0)
Myoglobin, poc: 86.2 ng/mL (ref 12–200)
Troponin i, poc: <0.05 ng/mL (ref 0.00–0.09)

## 2010-05-23 ENCOUNTER — Inpatient Hospital Stay (HOSPITAL_COMMUNITY): Payer: Medicare Other

## 2010-05-23 LAB — HEPARIN LEVEL (UNFRACTIONATED): Heparin Unfractionated: 0.48 IU/mL (ref 0.30–0.70)

## 2010-05-23 LAB — BASIC METABOLIC PANEL
BUN: 25 mg/dL — ABNORMAL HIGH (ref 6–23)
CO2: 25 mEq/L (ref 19–32)
GFR calc non Af Amer: 38 mL/min — ABNORMAL LOW (ref >60)
Glucose, Bld: 297 mg/dL — ABNORMAL HIGH (ref 70–99)
Potassium: 3.9 mEq/L (ref 3.5–5.1)

## 2010-05-23 LAB — BLOOD GAS, ARTERIAL
Bicarbonate: 22.9 mEq/L (ref 20.0–24.0)
O2 Saturation: 98.8 %
Patient temperature: 98.6
TCO2: 24 mmol/L (ref 0–100)
pH, Arterial: 7.454 — ABNORMAL HIGH (ref 7.350–7.400)
pO2, Arterial: 121 mmHg — ABNORMAL HIGH (ref 80.0–100.0)

## 2010-05-23 LAB — GLUCOSE, CAPILLARY
Glucose-Capillary: 289 mg/dL — ABNORMAL HIGH (ref 70–99)
Glucose-Capillary: 327 mg/dL — ABNORMAL HIGH (ref 70–99)
Glucose-Capillary: 260 mg/dL — ABNORMAL HIGH (ref 70–99)

## 2010-05-23 LAB — HEMOGLOBIN A1C: Mean Plasma Glucose: 186 mg/dL — ABNORMAL HIGH (ref <117)

## 2010-05-23 LAB — D-DIMER, QUANTITATIVE: D-Dimer, Quant: 1.3 ug/mL-FEU — ABNORMAL HIGH (ref 0.00–0.48)

## 2010-05-23 LAB — MRSA PCR SCREENING: MRSA by PCR: NEGATIVE

## 2010-05-23 MED ORDER — IOHEXOL 300 MG/ML  SOLN
80.0000 mL | Freq: Once | INTRAMUSCULAR | Status: AC | PRN
  Administered 2010-05-23: 80 mL via INTRAVENOUS

## 2010-05-24 ENCOUNTER — Inpatient Hospital Stay (HOSPITAL_COMMUNITY): Payer: Medicare Other

## 2010-05-24 LAB — CBC
HCT: 36.9 % (ref 36.0–46.0)
Hemoglobin: 11.8 g/dL — ABNORMAL LOW (ref 12.0–15.0)
MCH: 27.8 pg (ref 26.0–34.0)
MCHC: 32 g/dL (ref 30.0–36.0)
MCV: 86.8 fL (ref 78.0–100.0)

## 2010-05-24 LAB — GLUCOSE, CAPILLARY: Glucose-Capillary: 304 mg/dL — ABNORMAL HIGH (ref 70–99)

## 2010-05-24 LAB — BASIC METABOLIC PANEL
CO2: 24 mEq/L (ref 19–32)
Chloride: 98 mEq/L (ref 96–112)
Creatinine, Ser: 1.25 mg/dL — ABNORMAL HIGH (ref 0.4–1.2)
GFR calc Af Amer: 51 mL/min — ABNORMAL LOW (ref >60)
Glucose, Bld: 270 mg/dL — ABNORMAL HIGH (ref 70–99)

## 2010-05-25 ENCOUNTER — Inpatient Hospital Stay (HOSPITAL_COMMUNITY): Payer: Medicare Other

## 2010-05-25 LAB — BASIC METABOLIC PANEL
CO2: 24 mEq/L (ref 19–32)
Chloride: 97 mEq/L (ref 96–112)
GFR calc Af Amer: 45 mL/min — ABNORMAL LOW (ref >60)
Sodium: 135 mEq/L (ref 135–145)

## 2010-05-25 LAB — GLUCOSE, CAPILLARY
Glucose-Capillary: 268 mg/dL — ABNORMAL HIGH (ref 70–99)
Glucose-Capillary: 205 mg/dL — ABNORMAL HIGH (ref 70–99)

## 2010-05-26 LAB — BASIC METABOLIC PANEL
CO2: 23 mEq/L (ref 19–32)
Calcium: 10.1 mg/dL (ref 8.4–10.5)
Creatinine, Ser: 1.42 mg/dL — ABNORMAL HIGH (ref 0.4–1.2)
Glucose, Bld: 280 mg/dL — ABNORMAL HIGH (ref 70–99)

## 2010-05-26 LAB — GLUCOSE, CAPILLARY

## 2010-05-27 LAB — GLUCOSE, CAPILLARY
Glucose-Capillary: 209 mg/dL — ABNORMAL HIGH (ref 70–99)
Glucose-Capillary: 153 mg/dL — ABNORMAL HIGH (ref 70–99)
Glucose-Capillary: 232 mg/dL — ABNORMAL HIGH (ref 70–99)

## 2010-05-27 LAB — BASIC METABOLIC PANEL
BUN: 40 mg/dL — ABNORMAL HIGH (ref 6–23)
GFR calc non Af Amer: 40 mL/min — ABNORMAL LOW (ref >60)
Potassium: 4 mEq/L (ref 3.5–5.1)
Sodium: 134 mEq/L — ABNORMAL LOW (ref 135–145)

## 2010-05-28 ENCOUNTER — Inpatient Hospital Stay (HOSPITAL_COMMUNITY): Payer: Medicare Other

## 2010-05-28 LAB — CBC
HCT: 34.1 % — ABNORMAL LOW (ref 36.0–46.0)
Hemoglobin: 11.1 g/dL — ABNORMAL LOW (ref 12.0–15.0)
MCH: 28.4 pg (ref 26.0–34.0)
MCV: 87.2 fL (ref 78.0–100.0)
RBC: 3.91 MIL/uL (ref 3.87–5.11)

## 2010-05-28 LAB — BASIC METABOLIC PANEL WITH GFR
BUN: 36 mg/dL — ABNORMAL HIGH (ref 6–23)
CO2: 24 meq/L (ref 19–32)
Calcium: 9.3 mg/dL (ref 8.4–10.5)
Chloride: 103 meq/L (ref 96–112)
Creatinine, Ser: 1.4 mg/dL — ABNORMAL HIGH (ref 0.4–1.2)
GFR calc Af Amer: 45 mL/min — ABNORMAL LOW (ref >60)
GFR calc non Af Amer: 37 mL/min — ABNORMAL LOW (ref >60)
Glucose, Bld: 165 mg/dL — ABNORMAL HIGH (ref 70–99)
Potassium: 4.2 meq/L (ref 3.5–5.1)
Sodium: 135 meq/L (ref 135–145)

## 2010-05-28 LAB — GLUCOSE, CAPILLARY
Glucose-Capillary: 158 mg/dL — ABNORMAL HIGH (ref 70–99)
Glucose-Capillary: 112 mg/dL — ABNORMAL HIGH (ref 70–99)
Glucose-Capillary: 184 mg/dL — ABNORMAL HIGH (ref 70–99)
Glucose-Capillary: 139 mg/dL — ABNORMAL HIGH (ref 70–99)

## 2010-05-29 ENCOUNTER — Inpatient Hospital Stay (HOSPITAL_COMMUNITY): Payer: Medicare Other

## 2010-05-29 LAB — GLUCOSE, CAPILLARY: Glucose-Capillary: 144 mg/dL — ABNORMAL HIGH (ref 70–99)

## 2010-05-29 LAB — BASIC METABOLIC PANEL
BUN: 29 mg/dL — ABNORMAL HIGH (ref 6–23)
Chloride: 101 mEq/L (ref 96–112)
Creatinine, Ser: 1.17 mg/dL (ref 0.4–1.2)
GFR calc Af Amer: 55 mL/min — ABNORMAL LOW (ref >60)
GFR calc non Af Amer: 46 mL/min — ABNORMAL LOW (ref >60)

## 2010-05-29 LAB — CBC
MCH: 28.1 pg (ref 26.0–34.0)
Platelets: 266 10*3/uL (ref 150–400)
RBC: 3.92 MIL/uL (ref 3.87–5.11)
RDW: 18.1 % — ABNORMAL HIGH (ref 11.5–15.5)
WBC: 7.3 10*3/uL (ref 4.0–10.5)

## 2010-05-30 LAB — GLUCOSE, CAPILLARY
Glucose-Capillary: 112 mg/dL — ABNORMAL HIGH (ref 70–99)
Glucose-Capillary: 141 mg/dL — ABNORMAL HIGH (ref 70–99)
Glucose-Capillary: 135 mg/dL — ABNORMAL HIGH (ref 70–99)

## 2010-05-31 ENCOUNTER — Inpatient Hospital Stay (HOSPITAL_COMMUNITY): Payer: Medicare Other

## 2010-05-31 LAB — MAGNESIUM
Magnesium: 2.4 mg/dL (ref 1.5–2.5)
Magnesium: 2.4 mg/dL (ref 1.5–2.5)

## 2010-05-31 LAB — CBC
HCT: 36.2 % (ref 36.0–46.0)
MCHC: 32 g/dL (ref 30.0–36.0)
Platelets: 297 10*3/uL (ref 150–400)
RDW: 18.1 % — ABNORMAL HIGH (ref 11.5–15.5)
WBC: 6.8 10*3/uL (ref 4.0–10.5)

## 2010-05-31 LAB — BASIC METABOLIC PANEL
BUN: 26 mg/dL — ABNORMAL HIGH (ref 6–23)
Calcium: 8.9 mg/dL (ref 8.4–10.5)
Calcium: 9.3 mg/dL (ref 8.4–10.5)
Chloride: 100 mEq/L (ref 96–112)
Creatinine, Ser: 1.35 mg/dL — ABNORMAL HIGH (ref 0.4–1.2)
Creatinine, Ser: 1.35 mg/dL — ABNORMAL HIGH (ref 0.4–1.2)
GFR calc Af Amer: 47 mL/min — ABNORMAL LOW (ref >60)
GFR calc Af Amer: 47 mL/min — ABNORMAL LOW (ref >60)
GFR calc non Af Amer: 39 mL/min — ABNORMAL LOW (ref >60)
GFR calc non Af Amer: 39 mL/min — ABNORMAL LOW (ref >60)

## 2010-05-31 LAB — GLUCOSE, CAPILLARY
Glucose-Capillary: 220 mg/dL — ABNORMAL HIGH (ref 70–99)
Glucose-Capillary: 90 mg/dL (ref 70–99)

## 2010-05-31 LAB — PRO B NATRIURETIC PEPTIDE: Pro B Natriuretic peptide (BNP): 4850 pg/mL — ABNORMAL HIGH (ref 0–125)

## 2010-06-14 NOTE — Discharge Summary (Signed)
Paula [name], Paula [name] NO.:  [id number]  MEDICAL RECORD NO.:  [id number]           PATIENT TYPE:  I  LOCATION:  2033                         FACILITY:  [facility]  PHYSICIAN:  Paula [name], M.D.   DATE OF BIRTH:  1940-07-20  DATE OF ADMISSION:  05/22/2010 DATE OF DISCHARGE:  05/31/2010                              DISCHARGE SUMMARY   DISCHARGE DIAGNOSES: 1. 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 3. Moderate left ventricular hypertrophy with grade 3 diastolic     dysfunction by echocardiogram. 4. Treated hypertension. 5. Type 2 diabetes. 6. Nonobstructive coronary artery disease by catheterization in     September 2010. 7. History of dementia. 8. History of cerebrovascular accident. 9. Paroxysmal atrial fibrillation.  HOSPITAL COURSE:  The patient is a 70 year old female followed by Dr. Allyson [name].  She has a history of nonobstructive coronary disease in September 2010.  She has a known nonischemic cardiomyopathy with EF of 25%.  She presented to the emergency room on May 22, 2010 with dyspnea and mental status changes.  She was felt to be in congestive heart failure.  She was admitted to telemetry for diuresis.  Heparin was started.  On admission, she was in sinus rhythm with PVCs.  She did have a CT scan of her head which showed no acute process, she did have small vessel disease.  CT angiogram of the chest was negative for pulmonary embolism.  She improved with diuretics.  Her mental status remained confused.  Discussion with her daughter who is her power of attorney and was decided the patient would be a limited code, no CPR, no DC cardioversion, and no intubation.  The patient was put on a Lasix drip and had good diuresis with this.  We changed her to p.o. diuretics and transferred her to telemetry.  Her mental status remained poor, she  was intermittently confused and occasionally would not answer questions. She did not know where she was.  The patient was seen in consult by the clinical social worker.  Plan was for nursing home placement after discharge.  She was monitored over the holiday weekend.  There has been no change in her status and we feel she can probably be discharged today, May 31, 2010 to nursing home.  She did have transient runs of wide-complex tachycardia with a rate of about 100.  This appeared to be irregularly irregular.  Could be atrial fibrillation with aberrancy versus nonsustained V-tach.  Dr. Herbie [name] reviewed the strips prior to discharge.  Ultimately felt that no change in her medical therapy would be undertaken.  She was not symptomatic with these runs.  Aldactone was added prior to discharge and we cut her potassium back at discharge.  We will also cut back on her Lasix to 40 mg a day at discharge.  We feel she can be discharged on nursing home on May 31, 2010.  LABS AT DISCHARGE:  White count 6.8, hemoglobin 11.6, hematocrit 36.2, platelets 297.  Sodium 131, potassium 5.1, BUN 26, creatinine  1.35, chlorides 100, CO2 of 21.  Pro-BNP is 4850.  Chest x-ray shows mild bibasilar airspace disease due to edema on the 30th.  EKG shows sinus rhythm, sinus bradycardia with PVCs.  Pro-BNP on admission was 9346. Troponins are negative.  TSH 2.61, hemoglobin A1c of 8.1.  DISPOSITION:  The patient is discharged in stable condition and will follow up with Dr. Allyson [name] in a few weeks.  She will need a followup BMP to assess her BUN and creatinine and potassium in 48 hours or so.     Paula [name], P.A.   ______________________________ Paula [name], M.D.    Paula [name]  D:  05/31/2010  T:  05/31/2010  Job:  [id number]  cc:   Paula [name], M.D.  Electronically Signed by Paula [name] P.A. on 06/01/2010 04:29:39 PM Electronically Signed by Paula [name] M.D. on 06/14/2010 10:45:11 AM

## 2010-07-19 ENCOUNTER — Inpatient Hospital Stay (HOSPITAL_COMMUNITY)
Admission: EM | Admit: 2010-07-19 | Discharge: 2010-07-24 | DRG: 292 | Disposition: A | Discharge status: Home Health | Payer: Medicare Other | Attending: Cardiovascular Disease | Admitting: Cardiovascular Disease

## 2010-07-19 ENCOUNTER — Emergency Department (HOSPITAL_COMMUNITY): Payer: Medicare Other

## 2010-07-19 DIAGNOSIS — E119 Type 2 diabetes mellitus without complications: Secondary | ICD-10-CM | POA: Diagnosis present

## 2010-07-19 DIAGNOSIS — Z66 Do not resuscitate: Secondary | ICD-10-CM | POA: Diagnosis present

## 2010-07-19 DIAGNOSIS — Z8673 Personal history of transient ischemic attack (TIA), and cerebral infarction without residual deficits: Secondary | ICD-10-CM

## 2010-07-19 DIAGNOSIS — I509 Heart failure, unspecified: Secondary | ICD-10-CM | POA: Diagnosis present

## 2010-07-19 DIAGNOSIS — Z7982 Long term (current) use of aspirin: Secondary | ICD-10-CM

## 2010-07-19 DIAGNOSIS — I10 Essential (primary) hypertension: Secondary | ICD-10-CM | POA: Diagnosis present

## 2010-07-19 DIAGNOSIS — F068 Other specified mental disorders due to known physiological condition: Secondary | ICD-10-CM | POA: Diagnosis present

## 2010-07-19 DIAGNOSIS — E785 Hyperlipidemia, unspecified: Secondary | ICD-10-CM | POA: Diagnosis present

## 2010-07-19 DIAGNOSIS — I5042 Chronic combined systolic (congestive) and diastolic (congestive) heart failure: Principal | ICD-10-CM | POA: Diagnosis present

## 2010-07-19 DIAGNOSIS — Z794 Long term (current) use of insulin: Secondary | ICD-10-CM

## 2010-07-19 DIAGNOSIS — I428 Other cardiomyopathies: Secondary | ICD-10-CM | POA: Diagnosis present

## 2010-07-19 LAB — CBC
HCT: 38.9 % (ref 36.0–46.0)
Hemoglobin: 12.1 g/dL (ref 12.0–15.0)
Hemoglobin: 12.9 g/dL (ref 12.0–15.0)
MCH: 27.9 pg (ref 26.0–34.0)
MCH: 27.7 pg (ref 26.0–34.0)
MCHC: 33.2 g/dL (ref 30.0–36.0)
MCV: 84.1 fL (ref 78.0–100.0)
MCV: 83.7 fL (ref 78.0–100.0)
Platelets: 248 10*3/uL (ref 150–400)
RBC: 4.33 MIL/uL (ref 3.87–5.11)
RBC: 4.65 MIL/uL (ref 3.87–5.11)
RDW: 16 % — ABNORMAL HIGH (ref 11.5–15.5)
WBC: 6.1 10*3/uL (ref 4.0–10.5)

## 2010-07-19 LAB — COMPREHENSIVE METABOLIC PANEL WITH GFR
ALT: 11 U/L (ref 0–35)
ALT: 9 U/L (ref 0–35)
AST: 13 U/L (ref 0–37)
AST: 11 U/L (ref 0–37)
Albumin: 4.1 g/dL (ref 3.5–5.2)
Albumin: 3.7 g/dL (ref 3.5–5.2)
Alkaline Phosphatase: 68 U/L (ref 39–117)
Alkaline Phosphatase: 64 U/L (ref 39–117)
BUN: 14 mg/dL (ref 6–23)
BUN: 17 mg/dL (ref 6–23)
CO2: 22 meq/L (ref 19–32)
CO2: 20 meq/L (ref 19–32)
Calcium: 9.8 mg/dL (ref 8.4–10.5)
Calcium: 9.9 mg/dL (ref 8.4–10.5)
Chloride: 105 meq/L (ref 96–112)
Chloride: 104 meq/L (ref 96–112)
Creatinine, Ser: 0.87 mg/dL (ref 0.50–1.10)
Creatinine, Ser: 1.06 mg/dL (ref 0.50–1.10)
GFR calc Af Amer: >60 mL/min
GFR calc Af Amer: >60 mL/min
GFR calc non Af Amer: >60 mL/min
GFR calc non Af Amer: 51 mL/min — ABNORMAL LOW
Glucose, Bld: 106 mg/dL — ABNORMAL HIGH (ref 70–99)
Glucose, Bld: 97 mg/dL (ref 70–99)
Potassium: 4 meq/L (ref 3.5–5.1)
Potassium: 3.9 meq/L (ref 3.5–5.1)
Sodium: 138 meq/L (ref 135–145)
Sodium: 138 meq/L (ref 135–145)
Total Bilirubin: 0.4 mg/dL (ref 0.3–1.2)
Total Bilirubin: 0.3 mg/dL (ref 0.3–1.2)
Total Protein: 7.8 g/dL (ref 6.0–8.3)
Total Protein: 7.2 g/dL (ref 6.0–8.3)

## 2010-07-19 LAB — PROTIME-INR
INR: 0.98 (ref 0.00–1.49)
Prothrombin Time: 13.2 seconds (ref 11.6–15.2)

## 2010-07-19 LAB — POCT I-STAT, CHEM 8
BUN: 17 mg/dL (ref 6–23)
Calcium, Ion: 1.23 mmol/L (ref 1.12–1.32)
Chloride: 109 meq/L (ref 96–112)
Creatinine, Ser: 0.8 mg/dL (ref 0.50–1.10)
Glucose, Bld: 124 mg/dL — ABNORMAL HIGH (ref 70–99)
HCT: 39 % (ref 36.0–46.0)
Hemoglobin: 13.3 g/dL (ref 12.0–15.0)
Potassium: 4.1 meq/L (ref 3.5–5.1)
Sodium: 139 meq/L (ref 135–145)
TCO2: 22 mmol/L (ref 0–100)

## 2010-07-19 LAB — TROPONIN I: Troponin I: <0.3 ng/mL

## 2010-07-19 LAB — GLUCOSE, CAPILLARY
Glucose-Capillary: 119 mg/dL — ABNORMAL HIGH (ref 70–99)
Glucose-Capillary: 114 mg/dL — ABNORMAL HIGH (ref 70–99)

## 2010-07-19 LAB — CK TOTAL AND CKMB (NOT AT ARMC)
CK, MB: 3 ng/mL (ref 0.3–4.0)
Relative Index: INVALID (ref 0.0–2.5)
Total CK: 67 U/L (ref 7–177)

## 2010-07-19 LAB — DIFFERENTIAL
Basophils Absolute: 0 10*3/uL (ref 0.0–0.1)
Eosinophils Absolute: 0.1 10*3/uL (ref 0.0–0.7)
Eosinophils Relative: 2 % (ref 0–5)
Lymphocytes Relative: 32 % (ref 12–46)

## 2010-07-19 LAB — PRO B NATRIURETIC PEPTIDE: Pro B Natriuretic peptide (BNP): 2921 pg/mL — ABNORMAL HIGH (ref 0–125)

## 2010-07-19 LAB — CARDIAC PANEL(CRET KIN+CKTOT+MB+TROPI): Relative Index: INVALID (ref 0.0–2.5)

## 2010-07-19 LAB — APTT: aPTT: 28 s (ref 24–37)

## 2010-07-19 LAB — TSH: TSH: 2.198 u[IU]/mL (ref 0.350–4.500)

## 2010-07-20 LAB — BASIC METABOLIC PANEL
BUN: 22 mg/dL (ref 6–23)
Calcium: 9.9 mg/dL (ref 8.4–10.5)
GFR calc Af Amer: >60 mL/min (ref >60)
GFR calc non Af Amer: 53 mL/min — ABNORMAL LOW (ref >60)
Glucose, Bld: 97 mg/dL (ref 70–99)
Potassium: 3.8 mEq/L (ref 3.5–5.1)

## 2010-07-20 LAB — CARDIAC PANEL(CRET KIN+CKTOT+MB+TROPI)
CK, MB: 2.9 ng/mL (ref 0.3–4.0)
Total CK: 55 U/L (ref 7–177)
Troponin I: <0.3 ng/mL (ref <0.30)

## 2010-07-20 LAB — GLUCOSE, CAPILLARY
Glucose-Capillary: 126 mg/dL — ABNORMAL HIGH (ref 70–99)
Glucose-Capillary: 139 mg/dL — ABNORMAL HIGH (ref 70–99)

## 2010-07-21 LAB — GLUCOSE, CAPILLARY
Glucose-Capillary: 127 mg/dL — ABNORMAL HIGH (ref 70–99)
Glucose-Capillary: 105 mg/dL — ABNORMAL HIGH (ref 70–99)
Glucose-Capillary: 104 mg/dL — ABNORMAL HIGH (ref 70–99)
Glucose-Capillary: 132 mg/dL — ABNORMAL HIGH (ref 70–99)

## 2010-07-21 LAB — PRO B NATRIURETIC PEPTIDE: Pro B Natriuretic peptide (BNP): 1841 pg/mL — ABNORMAL HIGH (ref 0–125)

## 2010-07-21 LAB — BASIC METABOLIC PANEL
CO2: 22 mEq/L (ref 19–32)
Calcium: 10 mg/dL (ref 8.4–10.5)
Sodium: 135 mEq/L (ref 135–145)

## 2010-07-22 LAB — GLUCOSE, CAPILLARY

## 2010-07-23 LAB — CBC
HCT: 35.1 % — ABNORMAL LOW (ref 36.0–46.0)
MCH: 27.4 pg (ref 26.0–34.0)
MCV: 83.6 fL (ref 78.0–100.0)
RBC: 4.2 MIL/uL (ref 3.87–5.11)
WBC: 5.5 10*3/uL (ref 4.0–10.5)

## 2010-07-23 LAB — BASIC METABOLIC PANEL
BUN: 25 mg/dL — ABNORMAL HIGH (ref 6–23)
CO2: 26 mEq/L (ref 19–32)
Chloride: 97 mEq/L (ref 96–112)
Creatinine, Ser: 1.25 mg/dL — ABNORMAL HIGH (ref 0.50–1.10)
Glucose, Bld: 126 mg/dL — ABNORMAL HIGH (ref 70–99)
Potassium: 4.2 mEq/L (ref 3.5–5.1)

## 2010-07-23 LAB — GLUCOSE, CAPILLARY
Glucose-Capillary: 146 mg/dL — ABNORMAL HIGH (ref 70–99)
Glucose-Capillary: 124 mg/dL — ABNORMAL HIGH (ref 70–99)

## 2010-07-24 LAB — GLUCOSE, CAPILLARY
Glucose-Capillary: 146 mg/dL — ABNORMAL HIGH (ref 70–99)
Glucose-Capillary: 129 mg/dL — ABNORMAL HIGH (ref 70–99)
Glucose-Capillary: 165 mg/dL — ABNORMAL HIGH (ref 70–99)

## 2010-07-26 NOTE — H&P (Signed)
NAME:  Paula [name], DRONE NO.:  [id number]  MEDICAL RECORD NO.:  [id number]  LOCATION:  [facility]                         FACILITY:  [facility]  PHYSICIAN:  Nanetta Batty, M.D.   DATE OF BIRTH:  10-Oct-1940  DATE OF ADMISSION:  07/19/2010 DATE OF DISCHARGE:                             HISTORY & PHYSICAL   CHIEF COMPLAINT:  Chest pain.  HISTORY OF PRESENT ILLNESS:  Paula [name] is a 70 year old demented, no code blue, female with normal coronaries in 2003 and 2010, who we are asked to see in the emergency room for chest pain by the emergency room physician.  She was recently hospitalized in May 2012, for congestive heart failure.  She has hypertension and nonischemic cardiomyopathy. Last ejection fraction was 20% to 25% by echocardiogram May 2012. Apparently, the home health aide called EMS today because the patient was short of breath and said she had chest pain.  En route, she was given nitroglycerin with some relief of her symptoms.  Currently in the emergency room, she is pleasant, pain free, and not short of breath. Her family was told by EMS that she was having "a heart attack." Recently, the family says her pressure has been high side.  She did have some shortness of breath last night as well according to family.  The patient's history is unreliable.  The history is per the patient's niece who is present.  The patient's daughter is not present, she is the patient's power of attorney.  PAST MEDICAL HISTORY:  Remarkable for; 1. Type 2 insulin-dependent diabetes. 2. She has had prior CVA. 3. She has hypertension as noted, currently poorly controlled. 4. She has dyslipidemia.  HOME MEDICATIONS:  Are not currently known.  ALLERGIES:  She has no known drug allergies.  SOCIAL HISTORY:  After her discharge at the end of May 2012, she was sent to a nursing home for 30 days, but now lives at home with the daughter.  Periodically, she has lived in assisted living and  nursing home.  FAMILY HISTORY:  Unremarkable.  REVIEW OF SYSTEMS:  Essentially unremarkable except for noted above.  PHYSICAL EXAMINATION:  VITAL SIGNS:  Blood pressure currently is 162/102, heart rate is 83, and temp is 98.3. GENERAL:  She is a well-developed and overweight African American female, in no acute distress. HEENT:  Normocephalic and atraumatic.  Extraocular movements are intact. Sclerae are nonicteric. NECK:  Without JVD or bruit. CHEST:  Fine crackles at the right base.  Otherwise, clear lung fields. CARDIAC:  Regular rate and rhythm without obvious murmur, rub, or gallop. ABDOMEN:  Obese and nontender.  She has a midline surgical scar. EXTREMITIES:  Lower extremity edema 1 to 2+ that is nonpitting. NEUROLOGIC:  Grossly intact.  She is awake, alert, and cooperative, but is unreliable in her history. SKIN: Cool and dry.  LABORATORY DATA:  CK-MB and troponins were negative.  Her EKG shows sinus rhythm with LVH repolarization changes.  BNP is 2921.  Chest x-ray shows cardiomegaly with no heart failure.  Renal profile is pending.  IMPRESSION: 1. Chest pain, probably related to mild congestive heart failure and     hypertension. 2. Normal coronaries in 2010 and 2003. 3.  Nonischemic cardiomyopathy with an EF of 20% to 25% by recent     echocardiogram in May 2012. 4. Type 2 insulin-dependent diabetes. 5. Dementia. 6. Hypertension with poor control.  PLAN:  The patient will be admitted to telemetry.  We will go ahead and cycle her enzymes.  She will be treated in the emergency room with 1 dose of IV Lasix.  We will adjust her medications for better blood pressure control.     Abelino Derrick, P.A.   ______________________________ Nanetta Batty, M.D.    Lenard Lance  D:  07/19/2010  T:  07/19/2010  Job:  [id number]  cc:   Nanetta Batty, M.D. [facility]  Electronically Signed by Corine Shelter P.A. on 07/26/2010 12:58:34 PM Electronically Signed by  Nanetta Batty M.D. on 07/26/2010 03:21:08 PM

## 2010-08-01 NOTE — Discharge Summary (Signed)
NAME:  Paula [name], Paula [name] NO.:  [id number]  MEDICAL RECORD NO.:  [id number]  LOCATION:  3732                         FACILITY:  [facility]  PHYSICIAN:  [place] Hilty, MD         DATE OF BIRTH:  [birth year]  DATE OF ADMISSION:  2010/08/05 DATE OF DISCHARGE:  07/24/2010                              DISCHARGE SUMMARY   DISCHARGE DIAGNOSES: 1. Hypertension, improved and stable. 2. Nonischemic cardiomyopathy with ejection fraction of 20-25%. 3. Chest pain with negative cardiac enzymes. 4. Dementia. 5. Diabetes mellitus type 2.  HOSPITAL COURSE:  Paula [name] is a 70 year old African American female with history of dementia, normal coronary arteries in 2003 and 2010, type 2 diabetes insulin dependent, prior CVA, dyslipidemia who presented with chest pain.  History also includes nonischemic cardiomyopathy, ejection fraction 20-25% by echo in May 2012.  The patient was admitted to telemetry.  Cardiac enzymes were cycled.  She was treated in the emergency room with a dose of IV Lasix.  Her blood pressure was monitored and blood pressure medications adjusted.  She was also started on IV nitroglycerin and heparin.  Subsequently changed to subcu Lovenox and changed to p.o. nitrates.  Her BNP was mildly elevated to 2920. Tobacco cessation consult was initiated.  The patient also received a peripherally inserted central catheter.  On July 21, 2010,  the patient stated she looked like a baby and that she thought is good.  Clinical social work met the patient and discussed education for congestive heart failure and the need for decreased sodium diet.  The patient was started on 0.1 mg of clonidine b.i.d., her Lasix was hopefully changed to 80 mg daily, hydralazine increased to 50 mg three times a day and she was started on Cozaar 50 mg 2 tablets by mouth daily.  Currently, Paula [name] is without complaints.  She has had no chest pain.  Cardiac enzymes were negative x3.  Blood  pressure is improved and stable.  She had been seen by Dr. Rennis Golden who felt she is ready for discharge home.  DISCHARGE LABORATORY DATA:  WBC 5.5, hemoglobin 11.5, hematocrit 35.1, platelets 236.  Sodium 131, potassium 4.2, chloride 97, carbon dioxide 26, BUN 25, creatinine 1.25, glucose 126, calcium 10.4.  BNP 658.5, this is decreased from admission BNP of 2921.  TSH is 2.198.  Again cardiac enzymes were negative.  STUDIES/PROCEDURES:  Chest x-ray 2010-08-05, shows probable left lower lobe atelectasis.  Lungs are clear.  No pleural effusion or pneumothorax.  DISCHARGE MEDICATIONS: 1. Alprazolam 0.25 mg 1 tablet by mouth twice daily as needed. 2. Clonidine 0.1 mg 1 tablet by mouth twice daily. 3. Furosemide 80 mg 1 tablet by mouth daily. 4. Hydralazine 50 mg 1 tablet by mouth three times a day. 5. Cozaar 50 mg 2 tablets by mouth daily. 6. Nitroglycerin sublingual 0.4 mg 1 tablet under the tongue every 5     minutes up to three doses total for chest pain. 7. Aspirin 325 mg 1 tablet by mouth daily. 8. Bystolic 10 mg 1 tablet by mouth daily. 9. Vitamin D 50,000 units 1 capsule by mouth on Wednesday. 10.Fosamax 70 mg 1 tablet by mouth once  weekly. 11.NovoLog insulin 2-5 units subcutaneously daily at bedtime. 12.NovoLog one 15 units subcutaneously 2 times a day with meals. 13.Lantus 10 units subcutaneously daily at bedtime. 14.Imdur 60 mg 1 tablet by mouth daily. 15.Loratadine 10 mg 1 tablet by mouth daily. 16.Os-Cal calcium over-the-counter 1 tablet by mouth daily. 17.Protonix 40 mg 1 tablet by mouth daily. 18.Potassium chloride 20 mEq 1 tablet by mouth daily. 19.Spironolactone 12.5 mg 1 tablet by mouth daily.  DISPOSITION:  Paula [name] will be discharged home in stable condition. It was recommended that she increase her activity slowly.  It is recommended she eats a low-sodium, heart-healthy, low-carbohydrate diet. She will follow with Dr. Allyson Sabal in approximately 1-2 weeks and  our office will call with the appointment times.  Also of note, the patient will have Home Health Services, Nursing, Physical Therapy provided by care self home care professionals.    ______________________________ Wilburt Finlay, PA   ______________________________ [place] Hilty, MD    [facility]MED[facility]  D:  07/24/2010  T:  07/24/2010  Job:  [id number]  cc:   Nanetta Batty, M.D. [facility]  Electronically Signed by Wilburt Finlay PA on 07/31/2010 02:14:41 PM Electronically Signed by Kirtland Bouchard. HILTY M.D. on 08/01/2010 01:31:47 PM

## 2010-09-21 ENCOUNTER — Emergency Department (HOSPITAL_COMMUNITY)
Admission: EM | Admit: 2010-09-21 | Discharge: 2010-09-21 | Disposition: A | Discharge status: Home / Self Care | Payer: Medicare Other | Attending: Emergency Medicine | Admitting: Emergency Medicine

## 2010-09-21 DIAGNOSIS — I10 Essential (primary) hypertension: Secondary | ICD-10-CM | POA: Insufficient documentation

## 2010-09-21 LAB — GLUCOSE, CAPILLARY: Glucose-Capillary: 220 mg/dL — ABNORMAL HIGH (ref 70–99)

## 2010-12-02 DIAGNOSIS — Z95810 Presence of automatic (implantable) cardiac defibrillator: Secondary | ICD-10-CM

## 2010-12-02 HISTORY — DX: Presence of automatic (implantable) cardiac defibrillator: Z95.810

## 2010-12-07 ENCOUNTER — Encounter (HOSPITAL_COMMUNITY): Payer: Self-pay

## 2010-12-11 ENCOUNTER — Other Ambulatory Visit: Payer: Self-pay | Admitting: Cardiovascular Disease

## 2010-12-15 ENCOUNTER — Encounter (HOSPITAL_COMMUNITY)
Admission: RE | Disposition: A | Discharge status: Home / Self Care | Payer: Self-pay | Source: Ambulatory Visit | Attending: Cardiovascular Disease

## 2010-12-15 ENCOUNTER — Encounter (HOSPITAL_COMMUNITY): Payer: Self-pay | Admitting: General Practice

## 2010-12-15 ENCOUNTER — Ambulatory Visit (HOSPITAL_COMMUNITY)
Admission: RE | Admit: 2010-12-15 | Discharge: 2010-12-16 | Disposition: A | Discharge status: Home / Self Care | Payer: Medicare Other | Source: Ambulatory Visit | Attending: Cardiovascular Disease | Admitting: Cardiovascular Disease

## 2010-12-15 DIAGNOSIS — I10 Essential (primary) hypertension: Secondary | ICD-10-CM | POA: Insufficient documentation

## 2010-12-15 DIAGNOSIS — Z95810 Presence of automatic (implantable) cardiac defibrillator: Secondary | ICD-10-CM

## 2010-12-15 DIAGNOSIS — E119 Type 2 diabetes mellitus without complications: Secondary | ICD-10-CM | POA: Insufficient documentation

## 2010-12-15 DIAGNOSIS — I2589 Other forms of chronic ischemic heart disease: Secondary | ICD-10-CM | POA: Insufficient documentation

## 2010-12-15 DIAGNOSIS — I509 Heart failure, unspecified: Secondary | ICD-10-CM | POA: Insufficient documentation

## 2010-12-15 HISTORY — DX: Headache: R51

## 2010-12-15 HISTORY — DX: Essential (primary) hypertension: I10

## 2010-12-15 HISTORY — PX: IMPLANTABLE CARDIOVERTER DEFIBRILLATOR IMPLANT: SHX5473

## 2010-12-15 HISTORY — DX: Cerebral infarction, unspecified: I63.9

## 2010-12-15 LAB — GLUCOSE, CAPILLARY
Glucose-Capillary: 221 mg/dL — ABNORMAL HIGH (ref 70–99)
Glucose-Capillary: 193 mg/dL — ABNORMAL HIGH (ref 70–99)

## 2010-12-15 LAB — SURGICAL PCR SCREEN
MRSA, PCR: NEGATIVE
Staphylococcus aureus: POSITIVE — AB

## 2010-12-15 SURGERY — IMPLANTABLE CARDIOVERTER DEFIBRILLATOR IMPLANT
Anesthesia: LOCAL

## 2010-12-15 MED ORDER — CLONIDINE HCL 0.1 MG PO TABS
0.1000 mg | ORAL_TABLET | Freq: Two times a day (BID) | ORAL | Status: DC
  Administered 2010-12-15 – 2010-12-16 (×2): 0.1 mg via ORAL
  Filled 2010-12-15 (×3): qty 1

## 2010-12-15 MED ORDER — PRAMOXINE HCL 1 % RE OINT
1.0000 "application " | TOPICAL_OINTMENT | Freq: Three times a day (TID) | RECTAL | Status: DC | PRN
  Filled 2010-12-15: qty 30

## 2010-12-15 MED ORDER — GUAIFENESIN-DM 100-10 MG/5ML PO SYRP: 15.0000 mL | ORAL_SOLUTION | ORAL | Status: DC | PRN

## 2010-12-15 MED ORDER — SODIUM CHLORIDE 0.9 % IJ SOLN: 3.0000 mL | INTRAMUSCULAR | Status: DC | PRN

## 2010-12-15 MED ORDER — HYDROCODONE-ACETAMINOPHEN 5-325 MG PO TABS
ORAL_TABLET | ORAL | Status: AC
  Filled 2010-12-15: qty 1

## 2010-12-15 MED ORDER — HEPARIN (PORCINE) IN NACL 2-0.9 UNIT/ML-% IJ SOLN
INTRAMUSCULAR | Status: AC
  Filled 2010-12-15: qty 1000

## 2010-12-15 MED ORDER — HYDRALAZINE HCL 20 MG/ML IJ SOLN
10.0000 mg | Freq: Once | INTRAMUSCULAR | Status: AC
  Administered 2010-12-15: 10 mg via INTRAVENOUS

## 2010-12-15 MED ORDER — SODIUM CHLORIDE 0.9 % IJ SOLN
10.0000 mg | Freq: Once | INTRAMUSCULAR | Status: DC
  Filled 2010-12-15: qty 0.5

## 2010-12-15 MED ORDER — HYDRALAZINE HCL 50 MG PO TABS
50.0000 mg | ORAL_TABLET | Freq: Three times a day (TID) | ORAL | Status: DC
  Administered 2010-12-15 – 2010-12-16 (×3): 50 mg via ORAL
  Filled 2010-12-15 (×5): qty 1

## 2010-12-15 MED ORDER — ACETAMINOPHEN 500 MG PO TABS
500.0000 mg | ORAL_TABLET | Freq: Four times a day (QID) | ORAL | Status: DC
  Filled 2010-12-15 (×4): qty 1

## 2010-12-15 MED ORDER — SODIUM CHLORIDE 0.9 % IV SOLN
INTRAVENOUS | Status: DC
  Administered 2010-12-15: 08:00:00 via INTRAVENOUS

## 2010-12-15 MED ORDER — HYDROCODONE-ACETAMINOPHEN 5-325 MG PO TABS
1.0000 | ORAL_TABLET | ORAL | Status: DC | PRN
  Administered 2010-12-15 (×3): 1 via ORAL
  Administered 2010-12-15: 2 via ORAL
  Administered 2010-12-15: 1 via ORAL
  Administered 2010-12-16 (×2): 2 via ORAL
  Filled 2010-12-15 (×2): qty 2
  Filled 2010-12-15 (×3): qty 1
  Filled 2010-12-15: qty 2

## 2010-12-15 MED ORDER — NITROGLYCERIN 0.4 MG SL SUBL: 0.4000 mg | SUBLINGUAL_TABLET | SUBLINGUAL | Status: DC | PRN

## 2010-12-15 MED ORDER — MUPIROCIN 2 % EX OINT
TOPICAL_OINTMENT | Freq: Two times a day (BID) | CUTANEOUS | Status: DC
  Administered 2010-12-15 – 2010-12-16 (×3): via NASAL
  Filled 2010-12-15: qty 22

## 2010-12-15 MED ORDER — SODIUM CHLORIDE 0.9 % IJ SOLN: 10.0000 mg | Freq: Once | INTRAMUSCULAR | Status: DC

## 2010-12-15 MED ORDER — LABETALOL HCL 5 MG/ML IV SOLN
INTRAVENOUS | Status: AC
  Filled 2010-12-15: qty 4

## 2010-12-15 MED ORDER — VITAMIN D (ERGOCALCIFEROL) 1.25 MG (50000 UNIT) PO CAPS
50000.0000 [IU] | ORAL_CAPSULE | ORAL | Status: DC
  Filled 2010-12-15: qty 1

## 2010-12-15 MED ORDER — SPIRONOLACTONE 12.5 MG HALF TABLET
12.5000 mg | ORAL_TABLET | Freq: Every day | ORAL | Status: DC
  Administered 2010-12-15 – 2010-12-16 (×2): 12.5 mg via ORAL
  Filled 2010-12-15 (×2): qty 1

## 2010-12-15 MED ORDER — FENTANYL CITRATE 0.05 MG/ML IJ SOLN
INTRAMUSCULAR | Status: AC
  Filled 2010-12-15: qty 2

## 2010-12-15 MED ORDER — MAGNESIUM HYDROXIDE 400 MG/5ML PO SUSP: 30.0000 mL | Freq: Every day | ORAL | Status: DC | PRN

## 2010-12-15 MED ORDER — INSULIN GLARGINE 100 UNIT/ML ~~LOC~~ SOLN
16.0000 [IU] | Freq: Every day | SUBCUTANEOUS | Status: DC
  Administered 2010-12-15: 16 [IU] via SUBCUTANEOUS
  Filled 2010-12-15: qty 3

## 2010-12-15 MED ORDER — HYDRALAZINE HCL 20 MG/ML IJ SOLN
INTRAMUSCULAR | Status: AC
  Filled 2010-12-15: qty 1

## 2010-12-15 MED ORDER — CALCIUM CARBONATE 1250 (500 CA) MG PO TABS
1.0000 | ORAL_TABLET | Freq: Every day | ORAL | Status: DC
  Administered 2010-12-15 – 2010-12-16 (×2): 500 mg via ORAL
  Filled 2010-12-15 (×2): qty 1

## 2010-12-15 MED ORDER — ISOSORBIDE MONONITRATE ER 60 MG PO TB24
60.0000 mg | ORAL_TABLET | Freq: Every day | ORAL | Status: DC
  Administered 2010-12-15 – 2010-12-16 (×2): 60 mg via ORAL
  Filled 2010-12-15 (×2): qty 1

## 2010-12-15 MED ORDER — ONDANSETRON HCL 4 MG/2ML IJ SOLN: 4.0000 mg | Freq: Four times a day (QID) | INTRAMUSCULAR | Status: DC | PRN

## 2010-12-15 MED ORDER — HYDRALAZINE HCL 20 MG/ML IJ SOLN
10.0000 mg | Freq: Once | INTRAMUSCULAR | Status: AC
  Administered 2010-12-15: 10 mg via INTRAVENOUS
  Filled 2010-12-15: qty 0.5

## 2010-12-15 MED ORDER — ACETAMINOPHEN 325 MG PO TABS: 325.0000 mg | ORAL_TABLET | ORAL | Status: DC | PRN

## 2010-12-15 MED ORDER — NEBIVOLOL HCL 10 MG PO TABS
10.0000 mg | ORAL_TABLET | Freq: Every day | ORAL | Status: DC
  Administered 2010-12-15 – 2010-12-16 (×2): 10 mg via ORAL
  Filled 2010-12-15 (×2): qty 1

## 2010-12-15 MED ORDER — FUROSEMIDE 80 MG PO TABS
80.0000 mg | ORAL_TABLET | Freq: Every day | ORAL | Status: DC
  Administered 2010-12-15 – 2010-12-16 (×2): 80 mg via ORAL
  Filled 2010-12-15 (×2): qty 1

## 2010-12-15 MED ORDER — MUPIROCIN 2 % EX OINT
TOPICAL_OINTMENT | CUTANEOUS | Status: AC
  Filled 2010-12-15: qty 22

## 2010-12-15 MED ORDER — HYDROCODONE-ACETAMINOPHEN 5-325 MG PO TABS: 1.0000 | ORAL_TABLET | ORAL | Status: DC | PRN

## 2010-12-15 MED ORDER — CHLORHEXIDINE GLUCONATE 4 % EX LIQD
60.0000 mL | Freq: Once | CUTANEOUS | Status: AC
  Administered 2010-12-15: 4 via TOPICAL
  Filled 2010-12-15: qty 60

## 2010-12-15 MED ORDER — POTASSIUM CHLORIDE CRYS ER 20 MEQ PO TBCR
20.0000 meq | EXTENDED_RELEASE_TABLET | Freq: Every day | ORAL | Status: DC
  Administered 2010-12-15 – 2010-12-16 (×2): 20 meq via ORAL
  Filled 2010-12-15 (×2): qty 1

## 2010-12-15 MED ORDER — PANTOPRAZOLE SODIUM 40 MG PO TBEC
40.0000 mg | DELAYED_RELEASE_TABLET | Freq: Every day | ORAL | Status: DC
  Administered 2010-12-15 – 2010-12-16 (×2): 40 mg via ORAL
  Filled 2010-12-15 (×2): qty 1

## 2010-12-15 MED ORDER — LIDOCAINE HCL (PF) 1 % IJ SOLN
INTRAMUSCULAR | Status: AC
  Filled 2010-12-15: qty 90

## 2010-12-15 MED ORDER — SODIUM CHLORIDE 0.45 % IV SOLN: INTRAVENOUS | Status: DC

## 2010-12-15 MED ORDER — MIDAZOLAM HCL 2 MG/2ML IJ SOLN
INTRAMUSCULAR | Status: AC
  Filled 2010-12-15: qty 2

## 2010-12-15 MED ORDER — LORATADINE 10 MG PO TABS
10.0000 mg | ORAL_TABLET | Freq: Every day | ORAL | Status: DC
  Administered 2010-12-15 – 2010-12-16 (×2): 10 mg via ORAL
  Filled 2010-12-15 (×2): qty 1

## 2010-12-15 MED ORDER — LOPERAMIDE HCL 2 MG PO CAPS: 2.0000 mg | ORAL_CAPSULE | ORAL | Status: DC | PRN

## 2010-12-15 MED ORDER — ALUM & MAG HYDROXIDE-SIMETH 200-200-20 MG/5ML PO SUSP: 15.0000 mL | ORAL | Status: DC | PRN

## 2010-12-15 MED ORDER — CEFAZOLIN SODIUM-DEXTROSE 2-3 GM-% IV SOLR
2.0000 g | INTRAVENOUS | Status: DC
  Filled 2010-12-15: qty 50

## 2010-12-15 MED ORDER — ALPRAZOLAM 0.25 MG PO TABS: 0.2500 mg | ORAL_TABLET | Freq: Two times a day (BID) | ORAL | Status: DC | PRN

## 2010-12-15 MED ORDER — SIMVASTATIN 20 MG PO TABS
20.0000 mg | ORAL_TABLET | Freq: Every day | ORAL | Status: DC
  Administered 2010-12-15: 20 mg via ORAL
  Filled 2010-12-15 (×2): qty 1

## 2010-12-15 MED ORDER — LOPERAMIDE HCL 2 MG PO CAPS: 4.0000 mg | ORAL_CAPSULE | Freq: Once | ORAL | Status: AC | PRN

## 2010-12-15 MED ORDER — ASPIRIN EC 325 MG PO TBEC
325.0000 mg | DELAYED_RELEASE_TABLET | Freq: Every day | ORAL | Status: DC
  Administered 2010-12-15 – 2010-12-16 (×2): 325 mg via ORAL
  Filled 2010-12-15 (×2): qty 1

## 2010-12-15 MED ORDER — SODIUM CHLORIDE 0.9 % IV SOLN: 250.0000 mL | INTRAVENOUS | Status: DC | PRN

## 2010-12-15 MED ORDER — SODIUM CHLORIDE 0.9 % IR SOLN
80.0000 mg | Status: DC
  Filled 2010-12-15: qty 2

## 2010-12-15 MED ORDER — SODIUM CHLORIDE 0.9 % IJ SOLN
3.0000 mL | Freq: Two times a day (BID) | INTRAMUSCULAR | Status: DC
  Administered 2010-12-15 – 2010-12-16 (×2): 3 mL via INTRAVENOUS

## 2010-12-15 NOTE — H&P (Signed)
   History reviewed, patient examined, no change in status, stable for surgery.  

## 2010-12-15 NOTE — Op Note (Addendum)
Procedure report  Procedure performed:  1. Implantation of new dual chamber cardioverter defibrillator  2. Fluoroscopy  3. Moderate sedation  4. Initial lead and generator testing and defibrillation threshold testing    Reason for procedure:  Primary prevention of sudden cardiac death Ischemic cardiomyopathy, left ventricular ejection fraction less than 30%, Heart failure NYHA class 2, on comprehensive medical therapy for over 90 days (MADIT-II)   Procedure performed by:  Thurmon Fair, MD  Complications:  None  Estimated blood loss:  <10 mL  Medications administered during procedure:  Ancef 1 g intravenously Lidocaine 1% 30 mL locally,  Fentanyl 100 mcg intravenously Versed 4 mg intravenously  Device details:  Generator Medtronic protecta model [id number] serial number PSK [id number] H Right atrial lead Medtronic [id number] serial number LFP [id number] Right ventricular lead Tronic Sprint Quattro secure [id number] serial number [id number]  Procedure details:  After the risks and benefits of the procedure were discussed the patient provided informed consent and was brought to the cardiac cath lab in the fasting state. The patient was prepped and draped in usual sterile fashion. Local anesthesia with 1% lidocaine was administered to to the left infraclavicular area. A 5-6 cm horizontal incision was made parallel with and 2-3 cm caudal to the left clavicle. Using electrocautery and blunt dissection a prepectoral pocket was created down to the level of the pectoralis major muscle fascia. The pocket was carefully inspected for hemostasis. An antibiotic-soaked sponge was placed in the pocket.  Under fluoroscopic guidance and using the modified Seldinger technique 2 separate venipunctures were performed to access the left subclavian vein. no difficulty was encountered accessing the vein.  Two J-tip guidewires were subsequently exchanged for 7 and 9.5 French safe sheaths.  Under  fluoroscopic guidance the ventricular lead was advanced to level of the mid to apical right ventricular septum and thet active-fixation helix was deployed. Prominent current of injury was seen. Satisfactory pacing and sensing parameters were recorded. There was no evidence of diaphragmatic stimulation at maximum device output. The safe sheath was peeled away and the lead was secured in place with 2-0 silk.  In similar fashion the right atrial lead was advanced to the level of the atrial appendage. The active-fixation helix was deployed. There was prominent current of injury. Satisfactory  pacing and sensing parameters were recorded. There was no evidence of diaphragmatic stimulation with pacing at maximum device output. The safe sheath was peeled away and the lead was secured in place with 2-0 silk.  The antibiotic-soaked sponge was removed from the pocket. The pocket was flushed with copious amounts of antibiotic solution. Reinspection showed excellent hemostasis.  The ventricular lead was connected to the generator and appropriate ventricular pacing was seen. Subsequently the atrial lead was also connected. Repeat testing of the lead parameters later showed excellent values.  The entire system was then carefully inserted in the pocket with care been taking that the leads and device assumed a comfortable position without pressure on the incision. Great care was taken that the leads be located deep to the generator. The pocket was then closed in layers using 2 layers of 2-0 Vicryl and cutaneous staples, after which a sterile dressing was applied.  Defibrillation threshold testing was then performed. After adequate sedation was achieved, ventricular fibrillation was induced with a 1J shock on T method. There was appropriate sensing by the device. There were no dropouts during "least sensitive" settings. The arrhythmia was terminated by a single 15J shock. High voltage impedance during the shock was  47 ohms  and 57 ohms for the SVC coil. Retesting of the leads confirmed normal function.  At the end of the procedure the following lead parameters were encountered:  Right atrial lead  sensed P waves 2.6 mV, impedance 475 ohms, threshold 1 V at 0.4 ms pulse width.  Right ventricular lead sensed R waves >20 mV, impedance 589 ohms, threshold 0.75 V at 0.4 ms pulse width.  High voltage impedance 47 ohms (57 ohms SVC), charge time 2.8 seconds  Cc:

## 2010-12-16 ENCOUNTER — Other Ambulatory Visit: Payer: Self-pay

## 2010-12-16 ENCOUNTER — Ambulatory Visit (HOSPITAL_COMMUNITY): Payer: Medicare Other

## 2010-12-16 LAB — GLUCOSE, CAPILLARY: Glucose-Capillary: 231 mg/dL — ABNORMAL HIGH (ref 70–99)

## 2010-12-16 MED ORDER — HYDROCODONE-ACETAMINOPHEN 10-500 MG PO TABS: 1.0000 | ORAL_TABLET | Freq: Four times a day (QID) | ORAL | Status: AC | PRN

## 2010-12-16 MED ORDER — NEBIVOLOL HCL 10 MG PO TABS: 10.0000 mg | ORAL_TABLET | Freq: Every day | ORAL | Status: DC

## 2010-12-16 MED ORDER — ISOSORBIDE MONONITRATE ER 60 MG PO TB24: 60.0000 mg | ORAL_TABLET | Freq: Two times a day (BID) | ORAL | Status: DC

## 2010-12-16 MED ORDER — CEFAZOLIN SODIUM 1-5 GM-% IV SOLN
1.0000 g | Freq: Once | INTRAVENOUS | Status: AC
  Administered 2010-12-16: 1 g via INTRAVENOUS
  Filled 2010-12-16 (×2): qty 50

## 2010-12-16 NOTE — Discharge Summary (Signed)
Physician Discharge Summary  Patient ID: Paula [name] MRN: [id number] DOB/AGE: 70-Jun-1942 70 y.o.  Admit date: 12/15/2010 Discharge date: 12/16/2010  Discharge Diagnoses:  1. Ischemic cardiomyopathy, left ventricular ejection fraction less than 30%  S/P Implantation of new dual chamber cardioverter defibrillator 2. Heart failure NYHA class 2  Discharged Condition: stable  Hospital Course:  The patient is a 70 year old female with history of nonischemic dilated cardiomyopathy with left ventricular ejection fraction of 20%. 2 normal coronary arteries by catheterization September 2010. History also includes hypertension, type 2 diabetes mellitus, prior ischemic CVA.  She presented for defibrillator implantation. The procedure was completed without complications. Chest x-ray shows no pneumothorax.  Patient discharged home in stable condition with wound site check in one week.  Significant Diagnostic Studies:  Procedure performed:  12/15/10:  1. Implantation of new dual chamber cardioverter defibrillator  2. Fluoroscopy  3. Moderate sedation  4. Initial lead and generator testing and defibrillation threshold testing  Reason for procedure:  Primary prevention of sudden cardiac death  Ischemic cardiomyopathy, left ventricular ejection fraction less than 30%, Heart failure NYHA class 2, on comprehensive medical therapy for over 90 days (MADIT-II  Device details: Generator Medtronic protecta model [id number] serial number [id number] [id number]  Right atrial lead Medtronic [id number] serial number LFP [id number]  Right ventricular lead Tronic Sprint Quattro secure [id number] serial number [id number] V   CHEST - 2 VIEW  Comparison: Chest x-ray 07/19/2010.  Findings: The heart is enlarged but stable. The mediastinal and  hilar contours are within normal limits. There is a new permanent  left-sided pacemaker with AICD and right atrial and ventricular  wires. No complicating features. Other than streaky  bibasilar  atelectasis the lungs are clear.  IMPRESSION:  Permanent left-sided pacemaker / AICD with wires is in good  position and no complicating features   Discharge Exam: Blood pressure 177/86, pulse 60, temperature 98.3 F (36.8 C), temperature source Oral, resp. rate 18, height 5' 3.5" (1.613 m), weight 85.8 kg (189 lb 2.5 oz), SpO2 99.00%.  Disposition: Home or Self Care  Discharge Orders    Future Orders Please Complete By Expires   Diet - low sodium heart healthy      Discharge instructions   12/30/10   Comments:   Wear sling for 2 weeks. Do not lift device-side arm above shoulder level or reach behind shoulder level for 2 weeks. Do not lift weights over 10 lb with that arm. Remove dressing 48 hours after procedure. May shower or sponge bathe, but do not allow surgical site to be soaked. DO NOT take tub baths or submerge in swimming pool, hot tub, etc. Keep site clean and dry. Do not apply betadine, neosporin or other topical medications unless specifically instructed to do so. If you have staples, these need to be removed in 7-14 days, usually in the doctor's office.. If you have steri-strips, do not peel off prematurely. They will come off by themselves in 7-14 days.   Increase activity slowly      Driving Restrictions      Comments:   If possible, avoid driving for 2 weeks. If driving is necessary, limit it to short distances and familiar routes. You should always wear a seatbelt when driving.   Remove dressing in 24 hours      Comments:   Remove dressing 48 hours after procedure, usually 24 hours after discharge. Keep site clean and dry.   Call MD for:      Comments:   If  you have a defibrillator (ICD) and receive one shock but do not feel otherwise ill, call your doctor's office. If you receive more than one shock in 24 hours, or if you receive one shock and feel unwell (chest pain, shortness of breath, loss of consciousness, etc.) seek emergency care (call 911).    Call MD for:  temperature >100.4      Call MD for:  severe uncontrolled pain      Call MD for:  redness, tenderness, or signs of infection (pain, swelling, redness, odor or green/yellow discharge around incision site)        Current Discharge Medication List    START taking these medications   Details  HYDROcodone-acetaminophen (LORTAB 10) 10-500 MG per tablet Take 1 tablet by mouth every 6 (six) hours as needed for pain. Qty: 30 tablet, Refills: 0      CONTINUE these medications which have CHANGED   Details  isosorbide mononitrate (IMDUR) 60 MG 24 hr tablet Take 1 tablet (60 mg total) by mouth 2 (two) times daily. Qty: 60 tablet, Refills: 11    nebivolol (BYSTOLIC) 10 MG tablet Take 1 tablet (10 mg total) by mouth at bedtime. Qty: 30 tablet, Refills: 11      CONTINUE these medications which have NOT CHANGED   Details  alendronate (FOSAMAX) 70 MG tablet Take 70 mg by mouth every 7 (seven) days. Take with a full glass of water on an empty stomach.     ALPRAZolam (XANAX) 0.25 MG tablet Take 0.25 mg by mouth 2 (two) times daily as needed. For anxiety     aspirin EC 325 MG tablet Take 325 mg by mouth daily.      calcium carbonate (OS-CAL - DOSED IN MG OF ELEMENTAL CALCIUM) 1250 MG tablet Take 1 tablet by mouth daily.      cloNIDine (CATAPRES) 0.1 MG tablet Take 0.1 mg by mouth 2 (two) times daily.      diclofenac sodium (VOLTAREN) 1 % GEL Apply 1 application topically 2 (two) times daily as needed. For pain     furosemide (LASIX) 80 MG tablet Take 80 mg by mouth daily.      hydrALAZINE (APRESOLINE) 50 MG tablet Take 50 mg by mouth 3 (three) times daily.      insulin aspart (NOVOLOG) 100 UNIT/ML injection Inject 3 Units into the skin daily as needed. For increased blood sugar     insulin glargine (LANTUS SOLOSTAR) 100 UNIT/ML injection Inject 16 Units into the skin at bedtime.     loratadine (CLARITIN) 10 MG tablet Take 10 mg by mouth daily.      nitroGLYCERIN (NITROSTAT) 0.4  MG SL tablet Place 0.4 mg under the tongue every 5 (five) minutes as needed. For chest pain     pantoprazole (PROTONIX) 40 MG tablet Take 40 mg by mouth daily.      potassium chloride SA (K-DUR,KLOR-CON) 20 MEQ tablet Take 20 mEq by mouth daily.      simvastatin (ZOCOR) 20 MG tablet Take 20 mg by mouth at bedtime.      spironolactone (ALDACTONE) 25 MG tablet Take 12.5 mg by mouth daily.      Vitamin D, Ergocalciferol, (DRISDOL) 50000 UNITS CAPS Take 50,000 Units by mouth every 7 (seven) days. Unknown which day      STOP taking these medications     HYDROcodone-acetaminophen (NORCO) 5-325 MG per tablet        Follow-up Information    Follow up with CROITORU,MIHAI on 12/27/2010.  Contact information:   [address] [place] [address] [phone]          Signed: Dwana Melena 12/16/2010, 11:27 AM

## 2010-12-16 NOTE — Progress Notes (Signed)
[facility]  DAILY PROGRESS NOTE   Subjective:  Sore at surgical site, otherwise no complaints. Objective:  Temp:  [97.4 F (36.3 C)-98.4 F (36.9 C)] 98.3 F (36.8 C) (12/15 0800) Pulse Rate:  [59-67] 60  (12/15 0800) Resp:  [17-22] 18  (12/15 0800) BP: (122-183)/(76-87) 177/86 mmHg (12/15 0800) SpO2:  [97 %-100 %] 99 % (12/15 0800) Weight:  [189 lb 2.5 oz (85.8 kg)] 189 lb 2.5 oz (85.8 kg) (12/15 0523) Weight change:   Intake/Output from previous day: 12/14 0701 - 12/15 0700 In: 460 [P.O.:460] Out: 1500 [Urine:1500]  Intake/Output from this shift:    Medications: Current Facility-Administered Medications  Medication Dose Route Frequency Provider Last Rate Last Dose  . 0.9 %  sodium chloride infusion  250 mL Intravenous PRN Candelario Steppe      . acetaminophen (TYLENOL) tablet 500 mg  500 mg Oral Q6H Ashna Dorough       Followed by  . acetaminophen (TYLENOL) tablet 325-650 mg  325-650 mg Oral Q4H PRN Lamonte Hartt      . ALPRAZolam (XANAX) tablet 0.25 mg  0.25 mg Oral BID PRN Harriet Sutphen      . alum & mag hydroxide-simeth (MAALOX/MYLANTA) 200-200-20 MG/5ML suspension 15-30 mL  15-30 mL Oral Q2H PRN Athens Lebeau      . aspirin EC tablet 325 mg  325 mg Oral Daily Windi Toro   325 mg at 12/15/10 1647  . calcium carbonate (OS-CAL - dosed in mg of elemental calcium) tablet 500 mg of elemental calcium  1 tablet Oral Daily Sreshta Cressler   500 mg of elemental calcium at 12/15/10 1640  . cloNIDine (CATAPRES) tablet 0.1 mg  0.1 mg Oral BID Bettyjo Lundblad   0.1 mg at 12/15/10 1852  . fentaNYL (SUBLIMAZE) 0.05 MG/ML injection           . furosemide (LASIX) tablet 80 mg  80 mg Oral Daily Chari Parmenter   80 mg at 12/15/10 1641  . guaiFENesin-dextromethorphan (ROBITUSSIN DM) 100-10 MG/5ML syrup 15 mL  15 mL Oral Q4H PRN Verniece Encarnacion      . hydrALAZINE (APRESOLINE) injection 10 mg  10 mg Intravenous Once Kollins Fenter   10 mg at 12/15/10 1048  .  hydrALAZINE (APRESOLINE) injection 10 mg  10 mg Intravenous Once [employer], PHARMD   10 mg at 12/15/10 2017  . hydrALAZINE (APRESOLINE) tablet 50 mg  50 mg Oral TID Pritika Alvarez   50 mg at 12/15/10 2207  . HYDROcodone-acetaminophen (NORCO) 5-325 MG per tablet 1-2 tablet  1-2 tablet Oral Q4H PRN Sarayu Prevost   2 tablet at 12/16/10 0626  . insulin glargine (LANTUS) injection 16 Units  16 Units Subcutaneous QHS Maizee Reinhold   16 Units at 12/15/10 2207  . isosorbide mononitrate (IMDUR) 24 hr tablet 60 mg  60 mg Oral Daily Kristion Holifield   60 mg at 12/15/10 1642  . labetalol (NORMODYNE,TRANDATE) 5 MG/ML injection           . labetalol (NORMODYNE,TRANDATE) 5 MG/ML injection           . loperamide (IMODIUM) capsule 2 mg  2 mg Oral PRN Coriana Angello      . loperamide (IMODIUM) capsule 4 mg  4 mg Oral Once PRN Daivon Rayos      . loratadine (CLARITIN) tablet 10 mg  10 mg Oral Daily Eulanda Dorion   10 mg at 12/15/10 1642  . magnesium hydroxide (MILK OF MAGNESIA) suspension 30  mL  30 mL Oral Daily PRN Augusta Mirkin      . midazolam (VERSED) 2 MG/2ML injection           . midazolam (VERSED) 2 MG/2ML injection           . mupirocin ointment (BACTROBAN) 2 %   Nasal BID Dutch Ing      . nebivolol (BYSTOLIC) tablet 10 mg  10 mg Oral Daily Leyton Magoon   10 mg at 12/15/10 1642  . nitroGLYCERIN (NITROSTAT) SL tablet 0.4 mg  0.4 mg Sublingual Q5 min PRN Vidit Boissonneault      . ondansetron (ZOFRAN) injection 4 mg  4 mg Intravenous Q6H PRN Halsey Persaud      . pantoprazole (PROTONIX) EC tablet 40 mg  40 mg Oral Daily Parminder Cupples   40 mg at 12/15/10 1648  . potassium chloride SA (K-DUR,KLOR-CON) CR tablet 20 mEq  20 mEq Oral Daily Ronda Kazmi   20 mEq at 12/15/10 1648  . pramoxine-mineral oil-zinc (TUCK'S) rectal ointment 1 application  1 application Rectal TID PRN Madgie Dhaliwal      . simvastatin (ZOCOR) tablet 20 mg  20 mg Oral QHS Jullia Mulligan   20 mg at 12/15/10 2208  . sodium  chloride 0.9 % injection 3 mL  3 mL Intravenous Q12H Roza Creamer   3 mL at 12/15/10 2206  . sodium chloride 0.9 % injection 3 mL  3 mL Intravenous PRN Xiara Knisley      . spironolactone (ALDACTONE) tablet 12.5 mg  12.5 mg Oral Daily Angle Karel   12.5 mg at 12/15/10 1643  . Vitamin D (Ergocalciferol) (DRISDOL) capsule 50,000 Units  50,000 Units Oral Q7 days Benyamin Jeff      . DISCONTD: 0.45 % sodium chloride infusion   Intravenous Continuous Jalani Cullifer      . DISCONTD: 0.9 %  sodium chloride infusion   Intravenous Continuous Journe [employer] 100 mL/hr at 12/15/10 0808    . DISCONTD: ceFAZolin (ANCEF) IVPB 2 g/50 mL premix  2 g Intravenous On Call Arlen Legendre      . DISCONTD: gentamicin (GARAMYCIN) 80 mg in sodium chloride irrigation 0.9 % 500 mL irrigation  80 mg Irrigation On Call [employer]      . DISCONTD: hydrALAZINE (APRESOLINE) NICU INJ syringe 2 mg/mL  10 mg Intravenous Once Dwana Melena, PA      . DISCONTD: hydrALAZINE (APRESOLINE) NICU INJ syringe 2 mg/mL  10 mg Intravenous Once Lora Poteet Seay, PHARMD      . DISCONTD: HYDROcodone-acetaminophen (NORCO) 5-325 MG per tablet 1 tablet  1 tablet Oral Q4H PRN Zuhayr Deeney        Physical Exam: Pacemaker site appears healthy, without bleeding or swelling.  Lab Results: Results for orders placed during the hospital encounter of 12/15/10 (from the past 48 hour(s))  GLUCOSE, CAPILLARY     Status: Abnormal   Collection Time   12/15/10  7:37 AM      Component Value Range Comment   Glucose-Capillary 221 (*) 70 - 99 (mg/dL)   SURGICAL PCR SCREEN     Status: Abnormal   Collection Time   12/15/10  7:39 AM      Component Value Range Comment   MRSA, PCR NEGATIVE  NEGATIVE     Staphylococcus aureus POSITIVE (*) NEGATIVE    GLUCOSE, CAPILLARY     Status: Abnormal   Collection Time   12/15/10 10:14 AM      Component Value Range Comment  Glucose-Capillary 193 (*) 70 - 99 (mg/dL)   GLUCOSE, CAPILLARY     Status: Abnormal     Collection Time   12/15/10  9:31 PM      Component Value Range Comment   Glucose-Capillary 238 (*) 70 - 99 (mg/dL)    Comment 1 Notify RN      Comment 2 Documented in Chart     GLUCOSE, CAPILLARY     Status: Abnormal   Collection Time   12/16/10  8:35 AM      Component Value Range Comment   Glucose-Capillary 231 (*) 70 - 99 (mg/dL)     Imaging: No pneumothorax or evidence of lead dislodgement  Device check this AM: Atrial lead imp 475 ohm, P wave 2.9 mV, threshold 0.75@0 .4 ms Ventr lead imp 589 ohm, R wave > 20 mV, threshold 0.5@0 .4 ms, HV 47/54 ohm  Assessment:  1. S/P successful dual chamber ICD implantation for primary prevention (severe ischemic cardiomyopathy, MADIT-II) 2. CHF, systolic chronic, euvolemic 3. HTN, severe - early morning BP often high, likely due to short effect of hydralazine - give as late in evening as possible.  Plan:  1. DC home. F/u wound check next week. Detailed wound care and activity restriction instructions were given. 2. Will schedule Bystolic at bedtime and give an evening dose of nitrates to help prevent early morning HBP.  Time Spent Directly with Patient:  45 minutes  Length of Stay:  LOS: 1 day    Paula [name] 12/16/2010, 8:38 AM

## 2010-12-16 NOTE — Discharge Summary (Signed)
Thurmon Fair, MD, [facility] [phone] 8:01 PM

## 2010-12-18 NOTE — Progress Notes (Signed)
   CARE MANAGEMENT NOTE 12/18/2010  Patient:  Paula [name], Paula [name]   Account Number:  [id number]  Date Initiated:  12/18/2010  Documentation initiated by:  GRAVES-BIGELOW,Wadie Liew  Subjective/Objective Assessment:   Pt procedure:  S/P Implantation of new dual chamber cardioverter defibrillator. Pt was from home with daughter Helmut Muster and daughter Myrene Buddy provides care during the day.     Action/Plan:   CM did call family to see if pt had any needs. Daughter states that pt has some redness at the surgical site and constant pain that the medication is not helping. RN CM stated to call the cardiologist and she had done so.   Anticipated DC Date:  12/16/2010   Anticipated DC Plan:  HOME/SELF CARE      DC Planning Services  CM consult      Choice offered to / List presented to:             Status of service:  Completed, signed off Medicare Important Message given?   (If response is "NO", the following Medicare IM given date fields will be blank) Date Medicare IM given:   Date Additional Medicare IM given:    Discharge Disposition:  HOME/SELF CARE  Per UR Regulation:    Comments:

## 2011-05-12 ENCOUNTER — Emergency Department (HOSPITAL_COMMUNITY)
Admission: EM | Admit: 2011-05-12 | Discharge: 2011-05-12 | Disposition: A | Discharge status: Home / Self Care | Payer: Medicare Other | Attending: Emergency Medicine | Admitting: Emergency Medicine

## 2011-05-12 ENCOUNTER — Emergency Department (HOSPITAL_COMMUNITY): Payer: Medicare Other

## 2011-05-12 ENCOUNTER — Encounter (HOSPITAL_COMMUNITY): Payer: Self-pay

## 2011-05-12 DIAGNOSIS — R5383 Other fatigue: Secondary | ICD-10-CM | POA: Insufficient documentation

## 2011-05-12 DIAGNOSIS — R609 Edema, unspecified: Secondary | ICD-10-CM | POA: Insufficient documentation

## 2011-05-12 DIAGNOSIS — R944 Abnormal results of kidney function studies: Secondary | ICD-10-CM | POA: Insufficient documentation

## 2011-05-12 DIAGNOSIS — R5381 Other malaise: Secondary | ICD-10-CM | POA: Insufficient documentation

## 2011-05-12 DIAGNOSIS — E119 Type 2 diabetes mellitus without complications: Secondary | ICD-10-CM | POA: Insufficient documentation

## 2011-05-12 DIAGNOSIS — R4182 Altered mental status, unspecified: Secondary | ICD-10-CM

## 2011-05-12 DIAGNOSIS — F0390 Unspecified dementia without behavioral disturbance: Secondary | ICD-10-CM | POA: Insufficient documentation

## 2011-05-12 DIAGNOSIS — Z8673 Personal history of transient ischemic attack (TIA), and cerebral infarction without residual deficits: Secondary | ICD-10-CM | POA: Insufficient documentation

## 2011-05-12 DIAGNOSIS — Z794 Long term (current) use of insulin: Secondary | ICD-10-CM | POA: Insufficient documentation

## 2011-05-12 DIAGNOSIS — I10 Essential (primary) hypertension: Secondary | ICD-10-CM | POA: Insufficient documentation

## 2011-05-12 DIAGNOSIS — R7989 Other specified abnormal findings of blood chemistry: Secondary | ICD-10-CM

## 2011-05-12 LAB — COMPREHENSIVE METABOLIC PANEL WITH GFR
ALT: 8 U/L (ref 0–35)
AST: 10 U/L (ref 0–37)
Albumin: 4.1 g/dL (ref 3.5–5.2)
Alkaline Phosphatase: 58 U/L (ref 39–117)
BUN: 23 mg/dL (ref 6–23)
CO2: 23 meq/L (ref 19–32)
Calcium: 10.6 mg/dL — ABNORMAL HIGH (ref 8.4–10.5)
Chloride: 97 meq/L (ref 96–112)
Creatinine, Ser: 1.63 mg/dL — ABNORMAL HIGH (ref 0.50–1.10)
GFR calc Af Amer: 36 mL/min — ABNORMAL LOW
GFR calc non Af Amer: 31 mL/min — ABNORMAL LOW
Glucose, Bld: 74 mg/dL (ref 70–99)
Potassium: 4.7 meq/L (ref 3.5–5.1)
Sodium: 130 meq/L — ABNORMAL LOW (ref 135–145)
Total Bilirubin: 0.2 mg/dL — ABNORMAL LOW (ref 0.3–1.2)
Total Protein: 7.5 g/dL (ref 6.0–8.3)

## 2011-05-12 LAB — CBC
HCT: 35.9 % — ABNORMAL LOW (ref 36.0–46.0)
MCH: 28.8 pg (ref 26.0–34.0)
MCV: 86.1 fL (ref 78.0–100.0)
RBC: 4.17 MIL/uL (ref 3.87–5.11)
RDW: 13.4 % (ref 11.5–15.5)
WBC: 6.2 10*3/uL (ref 4.0–10.5)

## 2011-05-12 LAB — DIFFERENTIAL
Basophils Absolute: 0 10*3/uL (ref 0.0–0.1)
Basophils Relative: 0 % (ref 0–1)
Eosinophils Absolute: 0.1 10*3/uL (ref 0.0–0.7)
Eosinophils Relative: 1 % (ref 0–5)
Lymphocytes Relative: 29 % (ref 12–46)
Lymphs Abs: 1.8 10*3/uL (ref 0.7–4.0)
Monocytes Absolute: 0.6 10*3/uL (ref 0.1–1.0)
Monocytes Relative: 9 % (ref 3–12)
Neutro Abs: 3.8 10*3/uL (ref 1.7–7.7)
Neutrophils Relative %: 61 % (ref 43–77)

## 2011-05-12 LAB — URINALYSIS, ROUTINE W REFLEX MICROSCOPIC
Bilirubin Urine: NEGATIVE
Glucose, UA: NEGATIVE mg/dL
Hgb urine dipstick: NEGATIVE
Ketones, ur: NEGATIVE mg/dL
Leukocytes, UA: NEGATIVE
Nitrite: NEGATIVE
Protein, ur: NEGATIVE mg/dL
Specific Gravity, Urine: 1.011 (ref 1.005–1.030)
Urobilinogen, UA: 0.2 mg/dL (ref 0.0–1.0)
pH: 7.5 (ref 5.0–8.0)

## 2011-05-12 MED ORDER — SODIUM CHLORIDE 0.9 % IV BOLUS (SEPSIS): 500.0000 mL | INTRAVENOUS | Status: DC

## 2011-05-12 NOTE — ED Provider Notes (Signed)
History     CSN: [id number]  Arrival date & time 05/12/11  1611   First MD Initiated Contact with Patient 05/12/11 1635      Chief Complaint  Patient presents with  . Altered Mental Status    (Consider location/radiation/quality/duration/timing/severity/associated sxs/prior treatment) Patient is a 71 y.o. female presenting with altered mental status. The history is provided by the patient.  Altered Mental Status This is a new problem. Episode onset: 3 days ago. The problem occurs constantly. The problem has been unchanged. Associated symptoms include weakness. Pertinent negatives include no abdominal pain, chest pain, congestion, coughing, fatigue, fever, headaches, nausea, neck pain or vomiting. Associated symptoms comments: Weakness . The symptoms are aggravated by nothing. She has tried nothing for the symptoms. The treatment provided no relief.    Past Medical History  Diagnosis Date  . Angina   . Shortness of breath   . DEMENTIA   . Hypertension   . Stroke   . Headache   . Diabetes mellitus     Past Surgical History  Procedure Date  . Insert / replace / remove pacemaker 12/15/2010  . Back surgery   . Abdominal hysterectomy     History reviewed. No pertinent family history.  History  Substance Use Topics  . Smoking status: Passive Smoker    Types: Cigarettes  . Smokeless tobacco: Never Used  . Alcohol Use: No    OB History    Grav Para Term Preterm Abortions TAB SAB Ect Mult Living                  Review of Systems  Constitutional: Negative for fever and fatigue.  HENT: Negative for congestion, drooling and neck pain.   Eyes: Negative for pain.  Respiratory: Negative for cough and shortness of breath.   Cardiovascular: Negative for chest pain.  Gastrointestinal: Negative for nausea, vomiting, abdominal pain and diarrhea.  Genitourinary: Negative for dysuria and hematuria.  Musculoskeletal: Negative for back pain and gait problem.  Skin: Negative  for color change.  Neurological: Positive for weakness. Negative for dizziness, seizures, syncope, facial asymmetry, speech difficulty and headaches.  Hematological: Negative for adenopathy.  Psychiatric/Behavioral: Positive for altered mental status. Negative for behavioral problems.  All other systems reviewed and are negative.    Allergies  Review of patient's allergies indicates no known allergies.  Home Medications   Current Outpatient Rx  Name Route Sig Dispense Refill  . ALENDRONATE SODIUM 70 MG PO TABS Oral Take 70 mg by mouth every 7 (seven) days. Take with a full glass of water on an empty stomach. On Wednesdays    . ALPRAZOLAM 0.25 MG PO TABS Oral Take 0.25 mg by mouth 2 (two) times daily as needed. For anxiety     . ASPIRIN EC 325 MG PO TBEC Oral Take 325 mg by mouth daily.      Marland Kitchen CALCIUM CARBONATE 1250 MG PO TABS Oral Take 1 tablet by mouth daily.      Marland Kitchen CLONIDINE HCL 0.1 MG PO TABS Oral Take 0.1 mg by mouth 2 (two) times daily.      Marland Kitchen DICLOFENAC SODIUM 1 % TD GEL Topical Apply 1 application topically 2 (two) times daily as needed. For pain     . FUROSEMIDE 80 MG PO TABS Oral Take 80 mg by mouth daily.      Marland Kitchen GABAPENTIN 100 MG PO CAPS Oral Take 100 mg by mouth 3 (three) times daily.    Marland Kitchen HYDRALAZINE HCL 50 MG PO  TABS Oral Take 50 mg by mouth 3 (three) times daily.      . INSULIN ASPART 100 UNIT/ML [place] SOLN Subcutaneous Inject 3 Units into the skin daily as needed. For increased blood sugar (if over 150)    . INSULIN GLARGINE 100 UNIT/ML [place] SOLN Subcutaneous Inject 16 Units into the skin at bedtime.     . ISOSORBIDE MONONITRATE ER 60 MG PO TB24 Oral Take 60 mg by mouth 2 (two) times daily.    Marland Kitchen LORATADINE 10 MG PO TABS Oral Take 10 mg by mouth daily.      . NEBIVOLOL HCL 10 MG PO TABS Oral Take 10 mg by mouth at bedtime.    Marland Kitchen NITROGLYCERIN 0.4 MG SL SUBL Sublingual Place 0.4 mg under the tongue every 5 (five) minutes as needed. For chest pain     . PANTOPRAZOLE SODIUM 40 MG  PO TBEC Oral Take 40 mg by mouth daily.      Marland Kitchen POTASSIUM CHLORIDE CRYS ER 20 MEQ PO TBCR Oral Take 20 mEq by mouth daily.      Marland Kitchen SIMVASTATIN 20 MG PO TABS Oral Take 20 mg by mouth at bedtime.      . SPIRONOLACTONE 25 MG PO TABS Oral Take 12.5 mg by mouth daily.      . TRAMADOL HCL 50 MG PO TABS Oral Take 50 mg by mouth every 6 (six) hours as needed. For pain    . VITAMIN D (ERGOCALCIFEROL) 50000 UNITS PO CAPS Oral Take 50,000 Units by mouth every 7 (seven) days. Unknown which day      BP 112/57  Pulse 64  Temp(Src) 97.3 F (36.3 C) (Oral)  Resp 14  SpO2 100%  Physical Exam  Constitutional: She is oriented to person, place, and time. She appears well-developed and well-nourished.  HENT:  Head: Normocephalic.  Mouth/Throat: No oropharyngeal exudate.  Eyes: Conjunctivae and EOM are normal. Pupils are equal, round, and reactive to light.  Neck: Normal range of motion. Neck supple.  Cardiovascular: Normal rate, regular rhythm, normal heart sounds and intact distal pulses.  Exam reveals no gallop and no friction rub.   No murmur heard. Pulmonary/Chest: Effort normal and breath sounds normal. No respiratory distress. She has no wheezes.  Abdominal: Soft. Bowel sounds are normal. There is no tenderness.  Musculoskeletal: Normal range of motion. She exhibits edema (moderate swelling of bilateral LE's extending to thighs which is near baseline per family member). She exhibits no tenderness.  Neurological: She is alert and oriented to person, place, and time. She has normal strength. No cranial nerve deficit or sensory deficit. Coordination normal.       A/O to person/place, but not time. No obvious motor/sensory abnormalities on exam. No truncal ataxia.   Skin: Skin is warm and dry.  Psychiatric: She has a normal mood and affect. Her behavior is normal.    ED Course  Procedures (including critical care time)  Labs Reviewed  CBC - Abnormal; Notable for the following:    HCT 35.9 (*)     All other components within normal limits  COMPREHENSIVE METABOLIC PANEL - Abnormal; Notable for the following:    Sodium 130 (*)    Creatinine, Ser 1.63 (*)    Calcium 10.6 (*)    Total Bilirubin 0.2 (*)    GFR calc non Af Amer 31 (*)    GFR calc Af Amer 36 (*)    All other components within normal limits  URINALYSIS, ROUTINE W REFLEX MICROSCOPIC - Abnormal;  Notable for the following:    APPearance CLOUDY (*)    All other components within normal limits  GLUCOSE, CAPILLARY - Abnormal; Notable for the following:    Glucose-Capillary 155 (*)    All other components within normal limits  DIFFERENTIAL  GLUCOSE, CAPILLARY  URINALYSIS, WITH MICROSCOPIC  URINE CULTURE  URINE CULTURE  GRAM STAIN   Ct Head Wo Contrast  05/12/2011  *RADIOLOGY REPORT*  Clinical Data: Altered mental status.  CT HEAD WITHOUT CONTRAST  Technique:  Contiguous axial images were obtained from the base of the skull through the vertex without contrast.  Comparison: Head CT 05/23/2010.  Findings: Atrophy and extensive chronic microvascular ischemic change are again seen.  No evidence of acute infarction, hemorrhage, mass lesion, mass effect, midline shift or abnormal extra-axial fluid collection.  No hydrocephalus or pneumocephalus.  IMPRESSION: No acute finding.  Atrophy and extensive chronic microvascular ischemic disease.  Original Report Authenticated By: Bernadene Bell. D'ALESSIO, M.D.   Dg Chest Portable 1 View  05/12/2011  *RADIOLOGY REPORT*  Clinical Data: Altered mental status.  PORTABLE CHEST - 1 VIEW  Comparison: CT chest 05/23/2010.  Plain films of the chest 12/16/2010 11/29/2008.  Findings: There is cardiomegaly but no pulmonary edema.  Mild basilar atelectasis or scar is noted.  Lungs otherwise clear.  No pneumothorax or pleural fluid.  IMPRESSION: No acute disease.  Original Report Authenticated By: Bernadene Bell. D'ALESSIO, M.D.     1. Creatinine elevation   2. Altered mental status       MDM  1:05 AM 71 y.o.  female w hx of DM, CVA, Dementia, s/p pacer/defib pw AMS and diffuse weakness x 3 days. Family notes pt requiring help w/ ambulation and is also talking to dead people/saying strange things. Pt AFVSS here, interactive on exam, A/O x2, otherwise appears well. ECG shows paced rhythm. Will pursue infectious workup, and CT head to r/o bleed/ischemia.    1:05 AM: Pt continues to appear well on exam. Is ambulatory w/ mild assistance. Continues to be interactive. Pt found to have mildly elev Cr, otherwise imaging/labs non-contributory. Will rec po hydration and f/u w/ pcp in 2 days. Family comfortable with this and will be available to monitor pt closely over the weekend.  I have discussed the diagnosis/risks/treatment options with the patient and family and believe the pt to be eligible for discharge home to follow-up with pcp on Monday May 13. We also discussed returning to the ED immediately if new or worsening sx occur. We discussed the sx which are most concerning (e.g., worsening mental status, fever) that necessitate immediate return. Any new prescriptions provided to the patient are listed below.  New Prescriptions   No medications on file    Clinical Impression 1. Creatinine elevation   2. Altered mental status         Purvis Sheffield, MD 05/13/11 0105

## 2011-05-12 NOTE — ED Notes (Signed)
Pt transported to CT ?

## 2011-05-12 NOTE — ED Notes (Signed)
Warm pack applied to IV site. Right arm is hard and mildly tender.

## 2011-05-12 NOTE — ED Notes (Signed)
MD notified that this RN and IV team were unsuccessful in starting IV

## 2011-05-12 NOTE — ED Notes (Signed)
This RN attempted IV x3, other RN looked and unsuccessful. IV team paged for start.

## 2011-05-12 NOTE — ED Provider Notes (Addendum)
71 year old female was brought in by her daughter because of altered mental status. The last 3 days, she has been hallucinating and not making sense when she talked. In the past, this is indeed 2 urinary tract infections. On exam, she is oriented to person and place but not to time. There no focal neurologic findings. Her lungs are clear and heart has regular rate and rhythm. Workup is instituted for altered mental status with a focus on probable infectious cause the   Date: 05/12/2011  Rate: 61  Rhythm:  atriaal paced rhhyytthm  QRS Axis: left  Intervals: normal  ST/T Wave abnormalities: T wave inversion in the anterolateral leads, slightly secondary to left ventricular hypertrophy  Conduction Disutrbances:none  Narrative Interpretation: Atrial paced rhythm, left ventricular hypertrophy with secondary repolarization changes. When compared with ECG of 12/16/2010, T wave inversions are now present in the anterolateral leads. However, there is significant difference in fact she QRS morphology indicating possible variation in lead placed  Old EKG Reviewed: changes noted    Dione Booze, MD 05/12/11 1642  Dione Booze, MD 05/12/11 [phone]

## 2011-05-12 NOTE — ED Notes (Signed)
Pt d/c home in NAD. Pt daughter/caregiver voiced understanding of d/c instructions and follow up care.

## 2011-05-12 NOTE — ED Notes (Signed)
Pt daughter states pt is more lethargic than normal and has been speaking slower than usual. No slurred speech. Pt has hx of stroke. Pt is arousable and answers questions and follows command.

## 2011-05-12 NOTE — ED Notes (Addendum)
Per ems- Family states pt has not been to baseline x3days. Pt was lethargic en route. CBG:119. Pt paced rhythm. Pt has hx of dementia. Pt oriented to person and place. Pt states that she has no complaints

## 2011-05-12 NOTE — Discharge Instructions (Signed)
Altered Mental Status Altered mental status most often refers to an abnormal change in your responsiveness and awareness. It can affect your speech, thought, mobility, memory, attention span, or alertness. It can range from slight confusion to complete unresponsiveness (coma). Altered mental status can be a sign of a serious underlying medical condition. Rapid evaluation and medical treatment is necessary for patients having an altered mental status. CAUSES   Low blood sugar (hypoglycemia) or diabetes.   Severe loss of body fluids (dehydration) or a body salt (electrolyte) imbalance.   A stroke or other neurologic problem, such as dementia or delirium.   A head injury or tumor.   A drug or alcohol overdose.   Exposure to toxins or poisons.   Depression, anxiety, and stress.   A low oxygen level (hypoxia).   An infection.   Blood loss.   Twitching or shaking (seizure).   Heart problems, such as heart attack or heart rhythm problems (arrhythmias).   A body temperature that is too low or too high (hypothermia or hyperthermia).  DIAGNOSIS  A diagnosis is based on your history, symptoms, physical and neurologic examinations, and diagnostic tests. Diagnostic tests may include:  Measurement of your blood pressure, pulse, breathing, and oxygen levels (vital signs).   Blood tests.   Urine tests.   X-ray exams.   A computerized magnetic scan (magnetic resonance imaging, MRI).   A computerized X-ray scan (computed tomography, CT scan).  TREATMENT  Treatment will depend on the cause. Treatment may include:  Management of an underlying medical or mental health condition.   Critical care or support in the hospital.  HOME CARE INSTRUCTIONS   Only take over-the-counter or prescription medicines for pain, discomfort, or fever as directed by your caregiver.   Manage underlying conditions as directed by your caregiver.   Eat a healthy, well-balanced diet to maintain strength.    Join a support group or prevention program to cope with the condition or trauma that caused the altered mental status. Ask your caregiver to help choose a program that works for you.   Follow up with your caregiver for further examination, therapy, or testing as directed.  SEEK MEDICAL CARE IF:   You feel unwell or have chills.   You or your family notice a change in your behavior or your alertness.   You have trouble following your caregiver's treatment plan.   You have questions or concerns.  SEEK IMMEDIATE MEDICAL CARE IF:   You have a rapid heartbeat or have chest pain.   You have difficulty breathing.   You have a fever.   You have a headache with a stiff neck.   You cough up blood.   You have blood in your urine or stool.   You have severe agitation or confusion.  MAKE SURE YOU:   Understand these instructions.   Will watch your condition.   Will get help right away if you are not doing well or get worse.  Document Released: 06/07/2009 Document Revised: 12/07/2010 Document Reviewed: 06/07/2009 [employer] Patient Information 2012 [employer]. 

## 2011-05-13 NOTE — ED Provider Notes (Signed)
I saw and evaluated the patient, reviewed the resident's note and I agree with the findings and plan.   Corrion Stirewalt, MD 05/13/11 1500 

## 2011-05-14 LAB — URINE CULTURE: Culture  Setup Time: 201305120205

## 2011-07-02 ENCOUNTER — Telehealth: Payer: Self-pay | Admitting: Endocrinology

## 2011-07-02 NOTE — Telephone Encounter (Signed)
Sister, Helmut Muster calling.  She was seen for the first time in the office today.  Needs needles for her insulin syringes called to [facility] at [phone].

## 2011-07-03 MED ORDER — INSULIN PEN NEEDLE 31G X 8 MM MISC: 1.0000 | Freq: Two times a day (BID) | Status: DC | PRN

## 2012-03-28 ENCOUNTER — Other Ambulatory Visit: Payer: Self-pay | Admitting: Geriatric Medicine

## 2012-03-28 MED ORDER — SIMVASTATIN 20 MG PO TABS: 20.0000 mg | ORAL_TABLET | Freq: Every day | ORAL | Status: DC

## 2012-03-28 MED ORDER — ALENDRONATE SODIUM 70 MG PO TABS: 70.0000 mg | ORAL_TABLET | ORAL | Status: DC

## 2012-03-28 MED ORDER — SPIRONOLACTONE 25 MG PO TABS: 25.0000 mg | ORAL_TABLET | Freq: Every day | ORAL | Status: DC

## 2012-03-28 MED ORDER — FUROSEMIDE 80 MG PO TABS: 80.0000 mg | ORAL_TABLET | Freq: Every day | ORAL | Status: DC

## 2012-04-14 ENCOUNTER — Other Ambulatory Visit: Payer: Self-pay | Admitting: Internal Medicine

## 2012-04-16 ENCOUNTER — Other Ambulatory Visit: Payer: Self-pay | Admitting: Internal Medicine

## 2012-05-27 ENCOUNTER — Emergency Department (HOSPITAL_COMMUNITY): Payer: Medicare Other

## 2012-05-27 ENCOUNTER — Inpatient Hospital Stay (HOSPITAL_COMMUNITY)
Admission: EM | Admit: 2012-05-27 | Discharge: 2012-06-01 | DRG: 682 | Disposition: A | Discharge status: Home / Self Care | Payer: Medicare Other | Attending: Internal Medicine | Admitting: Internal Medicine

## 2012-05-27 ENCOUNTER — Encounter (HOSPITAL_COMMUNITY): Payer: Self-pay

## 2012-05-27 DIAGNOSIS — F17200 Nicotine dependence, unspecified, uncomplicated: Secondary | ICD-10-CM | POA: Diagnosis present

## 2012-05-27 DIAGNOSIS — R748 Abnormal levels of other serum enzymes: Secondary | ICD-10-CM | POA: Diagnosis present

## 2012-05-27 DIAGNOSIS — I5022 Chronic systolic (congestive) heart failure: Secondary | ICD-10-CM | POA: Diagnosis present

## 2012-05-27 DIAGNOSIS — N183 Chronic kidney disease, stage 3 unspecified: Secondary | ICD-10-CM | POA: Diagnosis present

## 2012-05-27 DIAGNOSIS — IMO0002 Reserved for concepts with insufficient information to code with codable children: Secondary | ICD-10-CM

## 2012-05-27 DIAGNOSIS — I10 Essential (primary) hypertension: Secondary | ICD-10-CM | POA: Diagnosis present

## 2012-05-27 DIAGNOSIS — F0390 Unspecified dementia without behavioral disturbance: Secondary | ICD-10-CM | POA: Diagnosis present

## 2012-05-27 DIAGNOSIS — R630 Anorexia: Secondary | ICD-10-CM | POA: Diagnosis present

## 2012-05-27 DIAGNOSIS — E875 Hyperkalemia: Secondary | ICD-10-CM | POA: Diagnosis present

## 2012-05-27 DIAGNOSIS — I509 Heart failure, unspecified: Secondary | ICD-10-CM | POA: Diagnosis present

## 2012-05-27 DIAGNOSIS — Z6829 Body mass index (BMI) 29.0-29.9, adult: Secondary | ICD-10-CM

## 2012-05-27 DIAGNOSIS — N179 Acute kidney failure, unspecified: Principal | ICD-10-CM | POA: Diagnosis present

## 2012-05-27 DIAGNOSIS — Z794 Long term (current) use of insulin: Secondary | ICD-10-CM

## 2012-05-27 DIAGNOSIS — I248 Other forms of acute ischemic heart disease: Secondary | ICD-10-CM | POA: Diagnosis present

## 2012-05-27 DIAGNOSIS — E119 Type 2 diabetes mellitus without complications: Secondary | ICD-10-CM | POA: Diagnosis present

## 2012-05-27 DIAGNOSIS — E860 Dehydration: Secondary | ICD-10-CM | POA: Diagnosis present

## 2012-05-27 DIAGNOSIS — G309 Alzheimer's disease, unspecified: Secondary | ICD-10-CM

## 2012-05-27 DIAGNOSIS — N289 Disorder of kidney and ureter, unspecified: Secondary | ICD-10-CM

## 2012-05-27 DIAGNOSIS — E876 Hypokalemia: Secondary | ICD-10-CM | POA: Diagnosis not present

## 2012-05-27 DIAGNOSIS — E41 Nutritional marasmus: Secondary | ICD-10-CM | POA: Diagnosis present

## 2012-05-27 DIAGNOSIS — Z79899 Other long term (current) drug therapy: Secondary | ICD-10-CM

## 2012-05-27 DIAGNOSIS — R627 Adult failure to thrive: Secondary | ICD-10-CM | POA: Diagnosis present

## 2012-05-27 DIAGNOSIS — I2489 Other forms of acute ischemic heart disease: Secondary | ICD-10-CM | POA: Diagnosis present

## 2012-05-27 DIAGNOSIS — I129 Hypertensive chronic kidney disease with stage 1 through stage 4 chronic kidney disease, or unspecified chronic kidney disease: Secondary | ICD-10-CM | POA: Diagnosis present

## 2012-05-27 DIAGNOSIS — I959 Hypotension, unspecified: Secondary | ICD-10-CM | POA: Diagnosis present

## 2012-05-27 DIAGNOSIS — F0280 Dementia in other diseases classified elsewhere without behavioral disturbance: Secondary | ICD-10-CM

## 2012-05-27 HISTORY — DX: Unspecified dementia without behavioral disturbance: F03.90

## 2012-05-27 HISTORY — DX: Unspecified dementia, unspecified severity, without behavioral disturbance, psychotic disturbance, mood disturbance, and anxiety: F03.90

## 2012-05-27 LAB — CBC WITH DIFFERENTIAL/PLATELET
Basophils Absolute: 0 10*3/uL (ref 0.0–0.1)
Eosinophils Absolute: 0.1 10*3/uL (ref 0.0–0.7)
Lymphs Abs: 1.4 10*3/uL (ref 0.7–4.0)
MCH: 29.2 pg (ref 26.0–34.0)
MCV: 85.8 fL (ref 78.0–100.0)
Monocytes Absolute: 0.5 10*3/uL (ref 0.1–1.0)
Monocytes Relative: 10 % (ref 3–12)
Platelets: UNDETERMINED 10*3/uL (ref 150–400)
RDW: 13.9 % (ref 11.5–15.5)
WBC: 5.3 10*3/uL (ref 4.0–10.5)

## 2012-05-27 LAB — URINALYSIS, ROUTINE W REFLEX MICROSCOPIC
Glucose, UA: NEGATIVE mg/dL
Hgb urine dipstick: NEGATIVE
Leukocytes, UA: NEGATIVE
Specific Gravity, Urine: 1.013 (ref 1.005–1.030)

## 2012-05-27 LAB — COMPREHENSIVE METABOLIC PANEL
AST: 51 U/L — ABNORMAL HIGH (ref 0–37)
AST: 54 U/L — ABNORMAL HIGH (ref 0–37)
Albumin: 4.2 g/dL (ref 3.5–5.2)
Albumin: 3.9 g/dL (ref 3.5–5.2)
Calcium: 12.3 mg/dL — ABNORMAL HIGH (ref 8.4–10.5)
Chloride: 91 mEq/L — ABNORMAL LOW (ref 96–112)
Creatinine, Ser: 2.19 mg/dL — ABNORMAL HIGH (ref 0.50–1.10)
Creatinine, Ser: 2.09 mg/dL — ABNORMAL HIGH (ref 0.50–1.10)
Potassium: 6.1 mEq/L — ABNORMAL HIGH (ref 3.5–5.1)
Total Bilirubin: 0.4 mg/dL (ref 0.3–1.2)
Total Protein: 8.3 g/dL (ref 6.0–8.3)
Total Protein: 8.4 g/dL — ABNORMAL HIGH (ref 6.0–8.3)

## 2012-05-27 LAB — POCT I-STAT TROPONIN I: Troponin i, poc: 0.6 ng/mL (ref 0.00–0.08)

## 2012-05-27 MED ORDER — SIMVASTATIN 20 MG PO TABS
20.0000 mg | ORAL_TABLET | Freq: Every day | ORAL | Status: DC
  Administered 2012-05-28 – 2012-05-31 (×5): 20 mg via ORAL
  Filled 2012-05-27 (×6): qty 1

## 2012-05-27 MED ORDER — SODIUM CHLORIDE 0.9 % IV SOLN
INTRAVENOUS | Status: DC
  Administered 2012-05-28 – 2012-05-30 (×3): via INTRAVENOUS

## 2012-05-27 MED ORDER — NITROGLYCERIN 0.4 MG SL SUBL: 0.4000 mg | SUBLINGUAL_TABLET | SUBLINGUAL | Status: DC | PRN

## 2012-05-27 MED ORDER — INSULIN ASPART 100 UNIT/ML ~~LOC~~ SOLN
0.0000 [IU] | Freq: Three times a day (TID) | SUBCUTANEOUS | Status: DC
  Administered 2012-05-28: 1 [IU] via SUBCUTANEOUS
  Administered 2012-05-29 – 2012-06-01 (×5): 2 [IU] via SUBCUTANEOUS
  Filled 2012-05-27: qty 10
  Filled 2012-05-27: qty 1

## 2012-05-27 MED ORDER — INSULIN ASPART 100 UNIT/ML IV SOLN
10.0000 [IU] | Freq: Once | INTRAVENOUS | Status: AC
  Administered 2012-05-27: 10 [IU] via INTRAVENOUS

## 2012-05-27 MED ORDER — CLONIDINE HCL 0.1 MG PO TABS
0.1000 mg | ORAL_TABLET | Freq: Two times a day (BID) | ORAL | Status: DC
  Administered 2012-05-27 – 2012-05-29 (×3): 0.1 mg via ORAL
  Filled 2012-05-27 (×5): qty 1

## 2012-05-27 MED ORDER — NEBIVOLOL HCL 10 MG PO TABS
10.0000 mg | ORAL_TABLET | Freq: Every day | ORAL | Status: DC
  Administered 2012-05-28 (×2): 10 mg via ORAL
  Filled 2012-05-27 (×3): qty 1

## 2012-05-27 MED ORDER — DEXTROSE 50 % IV SOLN
50.0000 mL | Freq: Once | INTRAVENOUS | Status: AC
  Administered 2012-05-27: 50 mL via INTRAVENOUS
  Filled 2012-05-27: qty 50

## 2012-05-27 MED ORDER — ASPIRIN EC 325 MG PO TBEC
325.0000 mg | DELAYED_RELEASE_TABLET | Freq: Every day | ORAL | Status: DC
  Administered 2012-05-28 – 2012-06-01 (×5): 325 mg via ORAL
  Filled 2012-05-27 (×6): qty 1

## 2012-05-27 MED ORDER — EZETIMIBE 10 MG PO TABS
10.0000 mg | ORAL_TABLET | Freq: Every day | ORAL | Status: DC
  Administered 2012-05-28 – 2012-06-01 (×5): 10 mg via ORAL
  Filled 2012-05-27 (×5): qty 1

## 2012-05-27 MED ORDER — HEPARIN SODIUM (PORCINE) 5000 UNIT/ML IJ SOLN
5000.0000 [IU] | Freq: Three times a day (TID) | INTRAMUSCULAR | Status: DC
  Administered 2012-05-28 – 2012-06-01 (×12): 5000 [IU] via SUBCUTANEOUS
  Filled 2012-05-27 (×16): qty 1

## 2012-05-27 MED ORDER — PANTOPRAZOLE SODIUM 40 MG PO TBEC
40.0000 mg | DELAYED_RELEASE_TABLET | Freq: Every day | ORAL | Status: DC
  Administered 2012-05-28 – 2012-06-01 (×5): 40 mg via ORAL
  Filled 2012-05-27 (×5): qty 1

## 2012-05-27 MED ORDER — SODIUM CHLORIDE 0.9 % IV BOLUS (SEPSIS)
2000.0000 mL | Freq: Once | INTRAVENOUS | Status: AC
  Administered 2012-05-27: 1000 mL via INTRAVENOUS

## 2012-05-27 MED ORDER — FENOFIBRATE 160 MG PO TABS
160.0000 mg | ORAL_TABLET | Freq: Every day | ORAL | Status: DC
  Administered 2012-05-28 – 2012-06-01 (×5): 160 mg via ORAL
  Filled 2012-05-27 (×5): qty 1

## 2012-05-27 MED ORDER — LIDOCAINE HCL (PF) 1 % IJ SOLN
INTRAMUSCULAR | Status: AC
  Filled 2012-05-27: qty 5

## 2012-05-27 MED ORDER — SODIUM POLYSTYRENE SULFONATE 15 GM/60ML PO SUSP
15.0000 g | Freq: Once | ORAL | Status: AC
  Administered 2012-05-27: 15 g via ORAL
  Filled 2012-05-27: qty 60

## 2012-05-27 MED ORDER — LIDOCAINE HCL (PF) 1 % IJ SOLN
INTRAMUSCULAR | Status: AC
  Administered 2012-05-27: 2 mL
  Filled 2012-05-27: qty 5

## 2012-05-27 MED ORDER — GABAPENTIN 100 MG PO CAPS
100.0000 mg | ORAL_CAPSULE | Freq: Three times a day (TID) | ORAL | Status: DC
  Administered 2012-05-28 – 2012-06-01 (×14): 100 mg via ORAL
  Filled 2012-05-27 (×16): qty 1

## 2012-05-27 MED ORDER — ALPRAZOLAM 0.25 MG PO TABS: 0.2500 mg | ORAL_TABLET | Freq: Two times a day (BID) | ORAL | Status: DC | PRN

## 2012-05-27 MED ORDER — SODIUM POLYSTYRENE SULFONATE 15 GM/60ML PO SUSP: 30.0000 g | Freq: Once | ORAL | Status: DC

## 2012-05-27 MED ORDER — SODIUM BICARBONATE 8.4 % IV SOLN
50.0000 meq | Freq: Once | INTRAVENOUS | Status: AC
  Administered 2012-05-27: 50 meq via INTRAVENOUS
  Filled 2012-05-27: qty 50

## 2012-05-27 MED ORDER — SODIUM CHLORIDE 0.9 % IV BOLUS (SEPSIS)
1000.0000 mL | Freq: Once | INTRAVENOUS | Status: AC
  Administered 2012-05-27: 1000 mL via INTRAVENOUS

## 2012-05-27 MED ORDER — TRAMADOL HCL 50 MG PO TABS
50.0000 mg | ORAL_TABLET | Freq: Four times a day (QID) | ORAL | Status: DC | PRN
  Administered 2012-06-01: 50 mg via ORAL
  Filled 2012-05-27 (×2): qty 1

## 2012-05-27 MED ORDER — SODIUM CHLORIDE 0.9 % IV SOLN
1.0000 g | Freq: Once | INTRAVENOUS | Status: DC
  Filled 2012-05-27: qty 10

## 2012-05-27 MED ORDER — SODIUM CHLORIDE 0.9 % IJ SOLN: 3.0000 mL | Freq: Two times a day (BID) | INTRAMUSCULAR | Status: DC

## 2012-05-27 MED ORDER — HYDRALAZINE HCL 50 MG PO TABS
50.0000 mg | ORAL_TABLET | Freq: Three times a day (TID) | ORAL | Status: DC
  Administered 2012-05-28 – 2012-05-29 (×5): 50 mg via ORAL
  Filled 2012-05-27 (×7): qty 1

## 2012-05-27 MED ORDER — LORATADINE 10 MG PO TABS
10.0000 mg | ORAL_TABLET | Freq: Every day | ORAL | Status: DC
  Administered 2012-05-28 – 2012-06-01 (×5): 10 mg via ORAL
  Filled 2012-05-27 (×5): qty 1

## 2012-05-27 NOTE — ED Notes (Signed)
Home health aide at the bedside.

## 2012-05-27 NOTE — ED Notes (Signed)
Bednar MD at bedside. 

## 2012-05-27 NOTE — ED Notes (Signed)
MD at bedside. 

## 2012-05-27 NOTE — ED Notes (Signed)
Pt returned from CT. VSS. Pt denies any complaint at this time. Resting comfortably in bed.

## 2012-05-27 NOTE — ED Notes (Signed)
IV team was unable to start line. MD Bednar made aware.

## 2012-05-27 NOTE — ED Notes (Signed)
IV team made aware of need for IV 

## 2012-05-27 NOTE — ED Notes (Signed)
Per lab, CMP sample was grossly hemolysed so sample is compromised. MD Bednar made aware.

## 2012-05-27 NOTE — ED Notes (Signed)
Attempted ultrasound guided IV access with Dr Fonnie Jarvis with no success. Pt tolerated well. Will attempt again.

## 2012-05-27 NOTE — ED Notes (Signed)
Two nurses attempted IV access with no success. IV team paged. MD Bednar made aware.

## 2012-05-27 NOTE — ED Notes (Signed)
Bednar MD at bedside. Central line cart and CVC kit placed in room

## 2012-05-27 NOTE — H&P (Signed)
[facility] History and Physical  Paula [name] ZOX:[id number] DOB: 1940/02/28 DOA: 05/27/2012  Referring physician: ED PCP: No primary provider on file.  Specialists: None  Chief Complaint: AMS  HPI: Paula [name] is a 72 y.o. female with baseline dementia who lives at home with family and caretaker.  Caretaker brought patient to ER today because patient has a 1 month history of gradually worsening decreased appetite and failure to thrive.  PO intake has decreased to the point where patient will not eat or drink anything so they brought her to the ED because they felt she likely needed IV fluids.  In the ED the patient's lab workup was consistent with fairly profound dehydration, AKI and hyperkalemia of 6.1.  UA was negative, IV access was very difficult due to her dehydration and as a result a central IVC had to be placed.  Hospitalist has been asked to admit.  Review of Systems: 12 systems reviewed and otherwise negative.  Past Medical History  Diagnosis Date  . Angina   . Shortness of breath   . DEMENTIA   . Hypertension   . Stroke   . Headache(784.0)   . Diabetes mellitus   . Dementia 05/27/2012   Past Surgical History  Procedure Laterality Date  . Insert / replace / remove pacemaker  12/15/2010  . Back surgery    . Abdominal hysterectomy     Social History:  reports that she has been passively smoking Cigarettes.  She has been smoking about 0.00 packs per day. She has never used smokeless tobacco. She reports that she does not drink alcohol or use illicit drugs.   No Known Allergies  No family history on file.   Prior to Admission medications   Medication Sig Start Date End Date Taking? Authorizing Provider  chlorthalidone (HYGROTON) 25 MG tablet Take 50 mg by mouth daily.   Yes Historical Provider, MD  ezetimibe (ZETIA) 10 MG tablet Take 10 mg by mouth daily.   Yes Historical Provider, MD  fenofibrate 160 MG tablet Take 160 mg by mouth daily.   Yes  Historical Provider, MD  furosemide (LASIX) 20 MG tablet Take 20 mg by mouth daily.   Yes Historical Provider, MD  insulin glargine (LANTUS SOLOSTAR) 100 UNIT/ML injection Inject 18 Units into the skin at bedtime.    Yes Historical Provider, MD  lisinopril (PRINIVIL,ZESTRIL) 40 MG tablet Take 40 mg by mouth daily.   Yes Historical Provider, MD  Omega-3 Fatty Acids (FISH OIL) 1200 MG CAPS Take 1 capsule by mouth daily.   Yes Historical Provider, MD  potassium chloride SA (K-DUR,KLOR-CON) 20 MEQ tablet Take 20 mEq by mouth daily.     Yes Historical Provider, MD  traMADol (ULTRAM) 50 MG tablet Take 50 mg by mouth every 6 (six) hours as needed. For pain   Yes Historical Provider, MD  alendronate (FOSAMAX) 70 MG tablet Take 1 tablet (70 mg total) by mouth every 7 (seven) days. Take with a full glass of water on an empty stomach. On Wednesdays 03/28/12   Sharee Holster, NP  ALPRAZolam Prudy Feeler) 0.25 MG tablet Take 0.25 mg by mouth 2 (two) times daily as needed. For anxiety     Historical Provider, MD  aspirin EC 325 MG tablet Take 325 mg by mouth daily.      Historical Provider, MD  calcium carbonate (OS-CAL - DOSED IN MG OF ELEMENTAL CALCIUM) 1250 MG tablet Take 1 tablet by mouth daily.      Historical Provider,  MD  cloNIDine (CATAPRES) 0.1 MG tablet Take 0.1 mg by mouth 2 (two) times daily.      Historical Provider, MD  diclofenac sodium (VOLTAREN) 1 % GEL Apply 1 application topically 2 (two) times daily as needed. For pain     Historical Provider, MD  gabapentin (NEURONTIN) 100 MG capsule Take 100 mg by mouth 3 (three) times daily.    Historical Provider, MD  hydrALAZINE (APRESOLINE) 50 MG tablet Take 50 mg by mouth 3 (three) times daily.      Historical Provider, MD  Insulin Pen Needle 31G X 8 MM MISC 1 each by Does not apply route 2 (two) times daily as needed. 07/03/11   Romero Belling, MD  isosorbide mononitrate (IMDUR) 60 MG 24 hr tablet Take 60 mg by mouth 2 (two) times daily. 12/16/10 12/16/11  Mihai  Croitoru, MD  loratadine (CLARITIN) 10 MG tablet Take 10 mg by mouth daily.      Historical Provider, MD  nebivolol (BYSTOLIC) 10 MG tablet Take 10 mg by mouth at bedtime. 12/16/10   Mihai Croitoru, MD  nitroGLYCERIN (NITROSTAT) 0.4 MG SL tablet Place 0.4 mg under the tongue every 5 (five) minutes as needed. For chest pain     Historical Provider, MD  NOVOLOG FLEXPEN 100 UNIT/ML injection INJECT 5 UNITS BEFORE MEALS FOR BLOOD SUGAR OVER 150 04/16/12   Kimber Relic, MD  pantoprazole (PROTONIX) 40 MG tablet TAKE ONE TABLET DAILY 04/14/12   Tiffany L Reed, DO  simvastatin (ZOCOR) 20 MG tablet Take 1 tablet (20 mg total) by mouth at bedtime. 03/28/12   Claudie Revering, NP  spironolactone (ALDACTONE) 25 MG tablet Take 1 tablet (25 mg total) by mouth daily. 03/28/12   Claudie Revering, NP   Physical Exam: Filed Vitals:   05/27/12 1508 05/27/12 1730 05/27/12 1955 05/27/12 2000  BP:  102/82 140/48 135/74  Pulse:      Temp:      TempSrc:      Resp:  19 11 18   SpO2: 99% 100% 98%     General:  NAD, resting comfortably in bed Eyes: PEERLA EOMI ENT: mucous membranes moist Neck: supple w/o JVD Cardiovascular: RRR w/o MRG Respiratory: CTA B Abdomen: soft, nt, nd, bs+ Skin: no rash nor lesion Musculoskeletal: MAE, full ROM all 4 extremities Psychiatric: normal tone and affect Neurologic: demented, non-focal, cooperative at times, follows simple commands, oriented to person only but this is baseline for patient.  Labs on Admission:  Basic Metabolic Panel:  Recent Labs Lab 05/27/12 1516 05/27/12 1806  NA 126* 128*  K 6.6* 6.1*  CL 89* 91*  CO2 21 17*  GLUCOSE 147* 131*  BUN 67* 68*  CREATININE 2.19* 2.09*  CALCIUM 12.3* 12.4*   Liver Function Tests:  Recent Labs Lab 05/27/12 1516 05/27/12 1806  AST 51* 54*  ALT 30 27  ALKPHOS 21* 22*  BILITOT 0.4 0.4  PROT 8.3 8.4*  ALBUMIN 4.2 3.9   No results found for this basename: LIPASE, AMYLASE,  in the last 168 hours No results found  for this basename: AMMONIA,  in the last 168 hours CBC:  Recent Labs Lab 05/27/12 1516  WBC 5.3  NEUTROABS 3.3  HGB 14.4  HCT 42.3  MCV 85.8  PLT PLATELET CLUMPS NOTED ON SMEAR, UNABLE TO ESTIMATE   Cardiac Enzymes: No results found for this basename: CKTOTAL, CKMB, CKMBINDEX, TROPONINI,  in the last 168 hours  BNP (last 3 results) No results found for this basename: PROBNP,  in the last 8760 hours CBG: No results found for this basename: GLUCAP,  in the last 168 hours  Radiological Exams on Admission: Dg Chest 2 View  05/27/2012   *RADIOLOGY REPORT*  Clinical Data: Altered mental status, history of diabetes, hypertension  CHEST - 2 VIEW  Comparison: 02/18/2012  Findings: Left subclavian AICD / pacer noted.  Mild cardiomegaly without CHF.  Low lung volumes persist with residual atelectasis. No new consolidation, collapse, edema, effusion, or pneumothorax. Trachea is midline.  IMPRESSION: Low volume exam with atelectasis.   Original Report Authenticated By: Judie Petit. Miles Costain, M.D.   Ct Head Wo Contrast  05/27/2012   *RADIOLOGY REPORT*  Clinical Data: Altered mental status, increased confusion  CT HEAD WITHOUT CONTRAST  Technique:  Contiguous axial images were obtained from the base of the skull through the vertex without contrast.  Comparison: 02/18/2012  Findings: Stable brain atrophy and ventricular enlargement. Moderate to severe periventricular white matter hypoattenuation worse on the left compatible with chronic microvascular ischemic changes.  Remote right basal ganglia lacunar type infarcts.  No acute intracranial hemorrhage, definite infarction, mass lesion, midline shift, herniation, or extra-axial fluid collection. Cisterns patent.  No cerebellar abnormality.  Mastoids and sinuses clear.  Overall stable exam.  IMPRESSION: Stable atrophy and chronic microvascular ischemic changes in the white matter.  No interval change or acute process by noncontrast CT.   Original Report Authenticated By:  Judie Petit. Miles Costain, M.D.    EKG: Independently reviewed.  Assessment/Plan Principal Problem:   Hyperkalemia, diminished renal excretion Active Problems:   AKI (acute kidney injury)   Dehydration   DM2 (diabetes mellitus, type 2)   HTN (hypertension)   Dementia   1. Hyperkalemia - on hyperkalemia pathway with kayexelate, insulin, glucose, bicarb, and Q4H potassium labs ordered.  No calcium since patient is already hypercalcemic with Ca > 12 2. Dehydration - getting 3L NS to start with as she is fairly severely dehydrated at this point 3. DM2 - on sensitive scale SSI for now 4. HTN - holding lisinopril and diuretics as nephrotoxic meds 5. AKI - likely due to dehydration, giving IVF monitor UOP.    Code Status: Full Code (must indicate code status--if unknown or must be presumed, indicate so) Family Communication: No family in room (indicate person spoken with, if applicable, with phone number if by telephone) Disposition Plan: Admit to inpatient (indicate anticipated LOS)  Time spent: 70 min  GARDNER, JARED M. [facility] Pager [phone]  If 7PM-7AM, please contact night-coverage [email or web address] Password [facility] 05/27/2012, 11:03 PM

## 2012-05-27 NOTE — ED Provider Notes (Signed)
History     CSN: [id number]  Arrival date & time 05/27/12  1408   First MD Initiated Contact with Patient 05/27/12 1447      Chief Complaint  Patient presents with  . Altered Mental Status    (Consider location/radiation/quality/duration/timing/severity/associated sxs/prior treatment) HPI 72 year old female has dementia at baseline and lives at home with family and caretaker, the caretaker brought the patient to the emergency department today because the patient has a one month history of gradually worsening decreased appetite decreased activity decreased strength and decreased oral intake to the point where today the patient will not eat or drink anything so had to be brought to the emergency department for IV fluids. There's been no trauma and no fever also no cough chest pain shortness breath abdominal pain nausea vomiting diarrhea bloody stools or focal lateralizing new weakness or incoordination the patient just has gradually worsening confusion with less talking and more sleeping and gradually worsening generalized weakness of the last month or so. A month or so ago the patient is able to walk short distances with mild assistance but now the patient can barely walk several feet with significant assistance. She has stable edema to her lower legs. The patient herself is a poor historian and is able to only follow a few simple commands and answer a few simple questions. Past Medical History  Diagnosis Date  . Angina   . Shortness of breath   . DEMENTIA   . Hypertension   . Stroke   . Headache(784.0)   . Diabetes mellitus     Past Surgical History  Procedure Laterality Date  . Insert / replace / remove pacemaker  12/15/2010  . Back surgery    . Abdominal hysterectomy      No family history on file.  History  Substance Use Topics  . Smoking status: Passive Smoke Exposure - Never Smoker    Types: Cigarettes  . Smokeless tobacco: Never Used  . Alcohol Use: No    OB History    Grav Para Term Preterm Abortions TAB SAB Ect Mult Living                  Review of Systems  Unable to perform ROS: Dementia    Allergies  Review of patient's allergies indicates no known allergies.  Home Medications   Current Outpatient Rx  Name  Route  Sig  Dispense  Refill  . chlorthalidone (HYGROTON) 25 MG tablet   Oral   Take 50 mg by mouth daily.         Marland Kitchen ezetimibe (ZETIA) 10 MG tablet   Oral   Take 10 mg by mouth daily.         . fenofibrate 160 MG tablet   Oral   Take 160 mg by mouth daily.         . furosemide (LASIX) 20 MG tablet   Oral   Take 20 mg by mouth daily.         . insulin glargine (LANTUS SOLOSTAR) 100 UNIT/ML injection   Subcutaneous   Inject 18 Units into the skin at bedtime.          Marland Kitchen lisinopril (PRINIVIL,ZESTRIL) 40 MG tablet   Oral   Take 40 mg by mouth daily.         . Omega-3 Fatty Acids (FISH OIL) 1200 MG CAPS   Oral   Take 1 capsule by mouth daily.         . potassium chloride  SA (K-DUR,KLOR-CON) 20 MEQ tablet   Oral   Take 20 mEq by mouth daily.           . traMADol (ULTRAM) 50 MG tablet   Oral   Take 50 mg by mouth every 6 (six) hours as needed. For pain         . alendronate (FOSAMAX) 70 MG tablet   Oral   Take 1 tablet (70 mg total) by mouth every 7 (seven) days. Take with a full glass of water on an empty stomach. On Wednesdays   4 tablet   3   . ALPRAZolam (XANAX) 0.25 MG tablet   Oral   Take 0.25 mg by mouth 2 (two) times daily as needed. For anxiety          . aspirin EC 325 MG tablet   Oral   Take 325 mg by mouth daily.           . calcium carbonate (OS-CAL - DOSED IN MG OF ELEMENTAL CALCIUM) 1250 MG tablet   Oral   Take 1 tablet by mouth daily.           . cloNIDine (CATAPRES) 0.1 MG tablet   Oral   Take 0.1 mg by mouth 2 (two) times daily.           . diclofenac sodium (VOLTAREN) 1 % GEL   Topical   Apply 1 application topically 2 (two) times daily as needed. For pain           . gabapentin (NEURONTIN) 100 MG capsule   Oral   Take 100 mg by mouth 3 (three) times daily.         . hydrALAZINE (APRESOLINE) 50 MG tablet   Oral   Take 50 mg by mouth 3 (three) times daily.           . Insulin Pen Needle 31G X 8 MM MISC   Does not apply   1 each by Does not apply route 2 (two) times daily as needed.   200 each   1   . EXPIRED: isosorbide mononitrate (IMDUR) 60 MG 24 hr tablet   Oral   Take 60 mg by mouth 2 (two) times daily.         Marland Kitchen loratadine (CLARITIN) 10 MG tablet   Oral   Take 10 mg by mouth daily.           . nebivolol (BYSTOLIC) 10 MG tablet   Oral   Take 10 mg by mouth at bedtime.         . nitroGLYCERIN (NITROSTAT) 0.4 MG SL tablet   Sublingual   Place 0.4 mg under the tongue every 5 (five) minutes as needed. For chest pain          . NOVOLOG FLEXPEN 100 UNIT/ML injection      INJECT 5 UNITS BEFORE MEALS FOR BLOOD SUGAR OVER 150   15 cartridge   3   . pantoprazole (PROTONIX) 40 MG tablet      TAKE ONE TABLET DAILY   30 tablet   3   . simvastatin (ZOCOR) 20 MG tablet   Oral   Take 1 tablet (20 mg total) by mouth at bedtime.   30 tablet   0     Patient needs an appointment before anymore refill ...   . spironolactone (ALDACTONE) 25 MG tablet   Oral   Take 1 tablet (25 mg total) by mouth daily.   30 tablet  0     Patient needs an appointment before anymore refill ...     BP 135/74  Pulse 86  Temp(Src) 98.3 F (36.8 C) (Oral)  Resp 18  SpO2 98%  Physical Exam  Nursing note and vitals reviewed. Constitutional:  Awake, alert, nontoxic appearance with baseline speech for patient.  HENT:  Head: Atraumatic.  Mouth/Throat: No oropharyngeal exudate.  Eyes: EOM are normal. Pupils are equal, round, and reactive to light. Right eye exhibits no discharge. Left eye exhibits no discharge.  Neck: Neck supple.  Cardiovascular: Normal rate and regular rhythm.   No murmur heard. Pulmonary/Chest: Effort  normal and breath sounds normal. No stridor. No respiratory distress. She has no wheezes. She has no rales. She exhibits no tenderness.  Abdominal: Soft. Bowel sounds are normal. She exhibits no mass. There is no tenderness. There is no rebound.  Musculoskeletal: She exhibits edema. She exhibits no tenderness.  Baseline ROM, moves extremities with no obvious new focal weakness. Mild bilateral lower leg edema.  Lymphadenopathy:    She has no cervical adenopathy.  Neurological: She is alert.  Awake, alert, cooperative at times, follows a few simple commands, oriented to person only not to place or time which is baseline for the patient; motor strength 4/5 bilaterally; sensation normal to light touch bilaterally; peripheral visual fields full to confrontation; no facial asymmetry; tongue midline; major cranial nerves appear intact; no pronator drift, normal finger to nose bilaterally  Skin: No rash noted.  Psychiatric: She has a normal mood and affect.    ED Course  Procedures (including critical care time) ECG: Sinus rhythm, ventricular rate 85, left axis deviation, premature ventricular complex, first degree AV block, left bundle branch block, compared with a 2013 atrial paced rhythm no longer present and left bundle branch block now present  Caregiver understands and agree with initial ED impression and plan with expectations set for ED visit.  D/w [facility] for admit. Pt stable in ED with no significant deterioration in condition.  ED nursing and IV team unable to obtain IV access, using ultrasound guidance I was also unable to obtain peripheral IV access in either the right or left upper arm using timeout and sterile technique, decision made to place a central line in since patient has dementia and prefers no needle in her neck for IV access I chose right femoral approach.  Procedure: Right femoral vein central line placement, indication lack of IV access despite multiple attempts, consent  implied and procedure emergent, timeout taken, usual sterile prep and drape, 1% lidocaine local anesthesia used 5 ML's, ultrasound guidance to localize right femoral vein, Seldinger technique used for 7 French triple-lumen catheter placement, good aspiration and flush of all 3 ports, central line sutured in place then sterile dressing applied by nursing, patient tolerated procedure well with no apparent immediate complications. 2220  CRITICAL CARE Performed by: Hurman Horn Total critical care time: due to hyperkalemia with acute renal insufficiency and dehydration. Critical care time was exclusive of separately billable procedures and treating other patients. Critical care was necessary to treat or prevent imminent or life-threatening deterioration. Critical care was time spent personally by me on the following activities: development of treatment plan with patient and/or surrogate as well as nursing, discussions with consultants, evaluation of patient's response to treatment, examination of patient, obtaining history from patient or surrogate, ordering and performing treatments and interventions, ordering and review of laboratory studies, ordering and review of radiographic studies, pulse oximetry and re-evaluation of patient's condition.  Labs Reviewed  COMPREHENSIVE METABOLIC PANEL - Abnormal; Notable for the following:    Sodium 126 (*)    Potassium 6.6 (*)    Chloride 89 (*)    Glucose, Bld 147 (*)    BUN 67 (*)    Creatinine, Ser 2.19 (*)    Calcium 12.3 (*)    AST 51 (*)    Alkaline Phosphatase 21 (*)    GFR calc non Af Amer 21 (*)    GFR calc Af Amer 25 (*)    All other components within normal limits  COMPREHENSIVE METABOLIC PANEL - Abnormal; Notable for the following:    Sodium 128 (*)    Potassium 6.1 (*)    Chloride 91 (*)    CO2 17 (*)    Glucose, Bld 131 (*)    BUN 68 (*)    Creatinine, Ser 2.09 (*)    Calcium 12.4 (*)    Total Protein 8.4 (*)    AST 54 (*)     Alkaline Phosphatase 22 (*)    GFR calc non Af Amer 23 (*)    GFR calc Af Amer 26 (*)    All other components within normal limits  POCT I-STAT TROPONIN I - Abnormal; Notable for the following:    Troponin i, poc 0.60 (*)    All other components within normal limits  URINE CULTURE  CULTURE, BLOOD (ROUTINE X 2)  CULTURE, BLOOD (ROUTINE X 2)  CBC WITH DIFFERENTIAL  URINALYSIS, ROUTINE W REFLEX MICROSCOPIC  POTASSIUM  CG4 I-STAT (LACTIC ACID)   Dg Chest 2 View  05/27/2012   *RADIOLOGY REPORT*  Clinical Data: Altered mental status, history of diabetes, hypertension  CHEST - 2 VIEW  Comparison: 02/18/2012  Findings: Left subclavian AICD / pacer noted.  Mild cardiomegaly without CHF.  Low lung volumes persist with residual atelectasis. No new consolidation, collapse, edema, effusion, or pneumothorax. Trachea is midline.  IMPRESSION: Low volume exam with atelectasis.   Original Report Authenticated By: Judie Petit. Miles Costain, M.D.   Ct Head Wo Contrast  05/27/2012   *RADIOLOGY REPORT*  Clinical Data: Altered mental status, increased confusion  CT HEAD WITHOUT CONTRAST  Technique:  Contiguous axial images were obtained from the base of the skull through the vertex without contrast.  Comparison: 02/18/2012  Findings: Stable brain atrophy and ventricular enlargement. Moderate to severe periventricular white matter hypoattenuation worse on the left compatible with chronic microvascular ischemic changes.  Remote right basal ganglia lacunar type infarcts.  No acute intracranial hemorrhage, definite infarction, mass lesion, midline shift, herniation, or extra-axial fluid collection. Cisterns patent.  No cerebellar abnormality.  Mastoids and sinuses clear.  Overall stable exam.  IMPRESSION: Stable atrophy and chronic microvascular ischemic changes in the white matter.  No interval change or acute process by noncontrast CT.   Original Report Authenticated By: Judie Petit. Shick, M.D.     1. Hyperkalemia   2. Acute renal  insufficiency   3. Dehydration   4. Hypercalcemia   5. Failure to thrive   6. Alzheimer's dementia       MDM  The patient appears reasonably stabilized for admission considering the current resources, flow, and capabilities available in the ED at this time, and I doubt any other [facility] requiring further screening and/or treatment in the ED prior to admission.        Hurman Horn, MD 05/28/12 1218

## 2012-05-27 NOTE — ED Notes (Signed)
Pt. s caregiver reports that about 1 month ago, pt. Became very confused ,  Decreased speaking, she cannot get out her  words.    Decreased eating only nibbling.  She is alert and oriented to self and place when asked .  Able to follow commands.  Pt. She denies any pain. Increased sleeping.

## 2012-05-27 NOTE — ED Notes (Signed)
Report received, 1st contact with pt and will assume care of pt at this time. 

## 2012-05-28 DIAGNOSIS — N289 Disorder of kidney and ureter, unspecified: Secondary | ICD-10-CM

## 2012-05-28 LAB — BASIC METABOLIC PANEL
CO2: 25 mEq/L (ref 19–32)
Calcium: 10.7 mg/dL — ABNORMAL HIGH (ref 8.4–10.5)
Chloride: 98 mEq/L (ref 96–112)
Sodium: 135 mEq/L (ref 135–145)

## 2012-05-28 LAB — GLUCOSE, CAPILLARY
Glucose-Capillary: 60 mg/dL — ABNORMAL LOW (ref 70–99)
Glucose-Capillary: 55 mg/dL — ABNORMAL LOW (ref 70–99)
Glucose-Capillary: 136 mg/dL — ABNORMAL HIGH (ref 70–99)
Glucose-Capillary: 146 mg/dL — ABNORMAL HIGH (ref 70–99)

## 2012-05-28 LAB — MAGNESIUM: Magnesium: 2.2 mg/dL (ref 1.5–2.5)

## 2012-05-28 LAB — CBC
Platelets: 221 10*3/uL (ref 150–400)
RBC: 4.26 MIL/uL (ref 3.87–5.11)
WBC: 5.4 10*3/uL (ref 4.0–10.5)

## 2012-05-28 LAB — POTASSIUM: Potassium: 3.1 mEq/L — ABNORMAL LOW (ref 3.5–5.1)

## 2012-05-28 LAB — URINE CULTURE
Colony Count: NO GROWTH
Culture: NO GROWTH

## 2012-05-28 LAB — PHOSPHORUS: Phosphorus: 1.9 mg/dL — ABNORMAL LOW (ref 2.3–4.6)

## 2012-05-28 MED ORDER — SODIUM PHOSPHATE 3 MMOLE/ML IV SOLN
30.0000 mmol | Freq: Once | INTRAVENOUS | Status: DC
  Filled 2012-05-28: qty 10

## 2012-05-28 MED ORDER — SODIUM GLYCEROPHOSPHATE 1 MMOLE/ML IV SOLN
10.0000 mmol | Freq: Once | INTRAVENOUS | Status: AC
  Administered 2012-05-29: 10 mmol via INTRAVENOUS
  Filled 2012-05-28: qty 10

## 2012-05-28 MED ORDER — DEXTROSE 50 % IV SOLN
INTRAVENOUS | Status: AC
  Administered 2012-05-28: 25 mL via INTRAVENOUS
  Filled 2012-05-28: qty 50

## 2012-05-28 MED ORDER — DEXTROSE 50 % IV SOLN: 25.0000 mL | Freq: Once | INTRAVENOUS | Status: AC | PRN

## 2012-05-28 MED ORDER — SODIUM GLYCEROPHOSPHATE 1 MMOLE/ML IV SOLN
20.0000 mmol | Freq: Once | INTRAVENOUS | Status: AC
  Administered 2012-05-28: 20 mmol via INTRAVENOUS
  Filled 2012-05-28: qty 20

## 2012-05-28 MED ORDER — POTASSIUM CHLORIDE CRYS ER 20 MEQ PO TBCR
40.0000 meq | EXTENDED_RELEASE_TABLET | Freq: Once | ORAL | Status: AC
  Administered 2012-05-28: 40 meq via ORAL
  Filled 2012-05-28: qty 2

## 2012-05-28 NOTE — Progress Notes (Signed)
CBG: 60  Treatment: 15 GM carbohydrate snack  Symptoms: None  Follow-up CBG: Time:  CBG Result:55  Possible Reasons for Event: Inadequate meal intake  Comments/MD notified: Treated with 1amp of D50 via IV for persistant hypoglycemia, CBG=136 after D50   Pt transferred from the ED around 0330, admitted to Rm [id number]. Pt comes from home with caretaker, no family at bedside currently. She is alert and oriented to self only. Ambulatory with 1 assist, states she normally uses a walker at home. No skin breakdown noted. Placed on telemetry, running NSR with frequent PVCs. Oriented to room, instructed to call for assistance before getting out of bed, bed alarm on. Resting comfortably at this time, will continue to monitor   [facility]

## 2012-05-28 NOTE — Progress Notes (Signed)
Utilization review completed.  

## 2012-05-28 NOTE — Progress Notes (Addendum)
INITIAL NUTRITION ASSESSMENT  DOCUMENTATION CODES Per approved criteria  -Severe malnutrition in the context of chronic illness   INTERVENTION: 1. Now that potassium is beginning to normalize, strongly recommend diet liberalization to Regular. 2. Monitor magnesium, potassium, and phosphorus daily for at least 3 days, MD to replete as needed, as pt is at risk for refeeding syndrome given dx of severe malnutrition. Paged MD. 3. RD to add oral nutrition supplement once lytes more stable. 4. RD to continue to follow nutrition care plan  NUTRITION DIAGNOSIS: Inadequate oral intake related to dementia and FTT as evidenced by caretaker report and ongoing weight loss.   Goal: Intake to meet >90% of estimated nutrition needs.  Monitor:  weight trends, lab trends, I/O's, PO intake, supplement tolerance  Reason for Assessment: Progression Rounds  72 y.o. female  Admitting Dx: Hyperkalemia, diminished renal excretion  ASSESSMENT: Hx of baseline dementia. Lives at home with family and caretaker. Per caregiver, over the past month, pt has become very confused, eating minimally, and increased sleeping. PO intake has decreased to the point where she is no longer eating or drinking anything, pt was then brought into the ED.  Work-up reveals dehydration and AKI. Received kayexalate on admission 2/2 hyperkalemia.  RN notes that pt had a low blood sugar last night, was given an Ensure Complete and she consumed 100% of it. Pt also was noted to consume 100% of her breakfast this morning. Unable to obtain information from pt 2/2 advanced dementia.  Pt meets criteria for severe MALNUTRITION in the context of chronic illness as evidenced by 12% wt loss in 6 months and intake of <75% of estimated needs x at least 1 month.  Pt is at risk for refeeding syndrome. Question if pt is currently experiencing refeeding syndrome at this time with profound drop in potassium, however pt was noted to receive kayexalate  last evening. Discussed potassium trends with pharmacist on unit and she notes that pt may in fact be refeeding given such profound drop in potassium and minimal kayexalate dose of 15 grams x 1.  Height: Ht Readings from Last 1 Encounters:  05/28/12 5\' 5"  (1.651 m)    Weight: Wt Readings from Last 1 Encounters:  05/28/12 167 lb 12.3 oz (76.1 kg)    Ideal Body Weight: 125 lb  % Ideal Body Weight: 134%  Wt Readings from Last 10 Encounters:  05/28/12 167 lb 12.3 oz (76.1 kg)  12/16/10 189 lb 2.5 oz (85.8 kg)  12/16/10 189 lb 2.5 oz (85.8 kg)    Usual Body Weight: 189 lb  % Usual Body Weight: 88%; 12% wt loss x 6 months  BMI:  Body mass index is 27.92 kg/(m^2). Overweight.  Estimated Nutritional Needs: Kcal: 1550 - 1750 kcal Protein: 75 - 85 grams Fluid: at least 1.8 liters  Skin: intact  Diet Order: Renal 60-70 diet  EDUCATION NEEDS: -No education needs identified at this time  No intake or output data in the 24 hours ending 05/28/12 1024  Last BM: 5/28  Labs:   Recent Labs Lab 05/27/12 1516 05/27/12 1806 05/27/12 2313 05/28/12 0408 05/28/12 0620  NA 126* 128*  --   --  135  K 6.6* 6.1* 3.3* 3.1* 3.2*  CL 89* 91*  --   --  98  CO2 21 17*  --   --  25  BUN 67* 68*  --   --  54*  CREATININE 2.19* 2.09*  --   --  1.71*  CALCIUM 12.3* 12.4*  --   --  10.7*  GLUCOSE 147* 131*  --   --  195*    CBG (last 3)   Recent Labs  05/28/12 0421 05/28/12 0546 05/28/12 0805  GLUCAP 55* 136* 112*    Scheduled Meds: . aspirin EC  325 mg Oral Daily  . cloNIDine  0.1 mg Oral BID  . ezetimibe  10 mg Oral Daily  . fenofibrate  160 mg Oral Daily  . gabapentin  100 mg Oral TID  . heparin  5,000 Units Subcutaneous Q8H  . hydrALAZINE  50 mg Oral TID  . insulin aspart  0-9 Units Subcutaneous TID WC  . loratadine  10 mg Oral Daily  . nebivolol  10 mg Oral QHS  . pantoprazole  40 mg Oral Daily  . simvastatin  20 mg Oral QHS  . sodium chloride  3 mL Intravenous  Q12H    Continuous Infusions: . sodium chloride 125 mL/hr at 05/28/12 0827    Past Medical History  Diagnosis Date  . Angina   . Shortness of breath   . DEMENTIA   . Hypertension   . Stroke   . Headache(784.0)   . Diabetes mellitus   . Dementia 05/27/2012    Past Surgical History  Procedure Laterality Date  . Insert / replace / remove pacemaker  12/15/2010  . Back surgery    . Abdominal hysterectomy      Jarold Motto MS, RD, LDN Pager: [phone] After-hours pager: [phone]

## 2012-05-28 NOTE — Progress Notes (Signed)
[facility] PROGRESS NOTE  Paula [name]:[id number] DOB: 1940/04/04 DOA: 05/27/2012 PCP: No primary provider on file.  Assessment/Plan: 1. Hyperkalemia: resolved.  2. Dehydration; improving.  3. Diabetes Mellitus: hgba1c is pending.   CBG (last 3)   Recent Labs  05/28/12 0546 05/28/12 0805 05/28/12 1143  GLUCAP 136* 112* 98    Resume SSI for now.   4. Hypertension; holding lisinopril and diuretics for renal insufficiency. Better controlled.   5. Acute on CKD stage 3 : probably pre renal in origin, in view of the hypotension on admission. Gentle hydration and repeat levels have improved to 1.71.   6. Hypokalemia. Hypophosphatemia: replete as needed.   7. Slightly elevated troponin: possibly from acute renal failure and demand ischemia from hypotension. She is asymptomatic, denies any chest pain . REPEAT EKG today.   8. DVT prophylaxis   Code Status: full code Family Communication: none at bedside Disposition Plan: pending PT eval.   Consultants:  none  Procedures:  none  Antibiotics:  none  HPI/Subjective: Alert and comfortable.   Objective: Filed Vitals:   05/28/12 0213 05/28/12 0305 05/28/12 0542 05/28/12 0947  BP: 170/77 116/63 106/58 101/53  Pulse: 127 80 87 70  Temp:  98 F (36.7 C) 97.9 F (36.6 C) 98 F (36.7 C)  TempSrc:  Oral Oral Oral  Resp:  20 18 17   Height:  5\' 5"  (1.651 m)    Weight:  76.1 kg (167 lb 12.3 oz)    SpO2: 99% 95% 96% 98%   No intake or output data in the 24 hours ending 05/28/12 1204 Filed Weights   05/28/12 0305  Weight: 76.1 kg (167 lb 12.3 oz)    Exam:  Cardiovascular: RRR w/o MRG  Respiratory: CTA B  Abdomen: soft, nt, nd, bs+  Musculoskeletal: MAE, full ROM all 4 extremities Neurologic: demented, non-focal, cooperative at times, follows simple commands, oriented to person only but this is baseline for patient.   Data Reviewed: Basic Metabolic Panel:  Recent Labs Lab 05/27/12 1516  05/27/12 1806 05/27/12 2313 05/28/12 0408 05/28/12 0620  NA 126* 128*  --   --  135  K 6.6* 6.1* 3.3* 3.1* 3.2*  CL 89* 91*  --   --  98  CO2 21 17*  --   --  25  GLUCOSE 147* 131*  --   --  195*  BUN 67* 68*  --   --  54*  CREATININE 2.19* 2.09*  --   --  1.71*  CALCIUM 12.3* 12.4*  --   --  10.7*  MG  --   --   --   --  2.2  PHOS  --   --   --   --  1.9*   Liver Function Tests:  Recent Labs Lab 05/27/12 1516 05/27/12 1806  AST 51* 54*  ALT 30 27  ALKPHOS 21* 22*  BILITOT 0.4 0.4  PROT 8.3 8.4*  ALBUMIN 4.2 3.9   No results found for this basename: LIPASE, AMYLASE,  in the last 168 hours No results found for this basename: AMMONIA,  in the last 168 hours CBC:  Recent Labs Lab 05/27/12 1516 05/28/12 0620  WBC 5.3 5.4  NEUTROABS 3.3  --   HGB 14.4 12.2  HCT 42.3 37.1  MCV 85.8 87.1  PLT PLATELET CLUMPS NOTED ON SMEAR, UNABLE TO ESTIMATE 221   Cardiac Enzymes: No results found for this basename: CKTOTAL, CKMB, CKMBINDEX, TROPONINI,  in the last 168 hours BNP (last  3 results) No results found for this basename: PROBNP,  in the last 8760 hours CBG:  Recent Labs Lab 05/28/12 0259 05/28/12 0421 05/28/12 0546 05/28/12 0805  GLUCAP 60* 55* 136* 112*    Recent Results (from the past 240 hour(s))  CULTURE, BLOOD (ROUTINE X 2)     Status: None   Collection Time    05/27/12  3:38 PM      Result Value Range Status   Specimen Description BLOOD RIGHT HAND   Final   Special Requests BOTTLES DRAWN AEROBIC ONLY   Final   Culture  Setup Time 05/27/2012 20:35   Final   Culture     Final   Value:        BLOOD CULTURE RECEIVED NO GROWTH TO DATE CULTURE WILL BE HELD FOR 5 DAYS BEFORE ISSUING A FINAL NEGATIVE REPORT   Report Status PENDING   Incomplete  CULTURE, BLOOD (ROUTINE X 2)     Status: None   Collection Time    05/27/12  3:42 PM      Result Value Range Status   Specimen Description BLOOD RIGHT ARM   Final   Special Requests BOTTLES DRAWN AEROBIC ONLY    Final   Culture  Setup Time 05/27/2012 20:36   Final   Culture     Final   Value:        BLOOD CULTURE RECEIVED NO GROWTH TO DATE CULTURE WILL BE HELD FOR 5 DAYS BEFORE ISSUING A FINAL NEGATIVE REPORT   Report Status PENDING   Incomplete     Studies: Dg Chest 2 View  05/27/2012   *RADIOLOGY REPORT*  Clinical Data: Altered mental status, history of diabetes, hypertension  CHEST - 2 VIEW  Comparison: 02/18/2012  Findings: Left subclavian AICD / pacer noted.  Mild cardiomegaly without CHF.  Low lung volumes persist with residual atelectasis. No new consolidation, collapse, edema, effusion, or pneumothorax. Trachea is midline.  IMPRESSION: Low volume exam with atelectasis.   Original Report Authenticated By: Judie Petit. Miles Costain, M.D.   Ct Head Wo Contrast  05/27/2012   *RADIOLOGY REPORT*  Clinical Data: Altered mental status, increased confusion  CT HEAD WITHOUT CONTRAST  Technique:  Contiguous axial images were obtained from the base of the skull through the vertex without contrast.  Comparison: 02/18/2012  Findings: Stable brain atrophy and ventricular enlargement. Moderate to severe periventricular white matter hypoattenuation worse on the left compatible with chronic microvascular ischemic changes.  Remote right basal ganglia lacunar type infarcts.  No acute intracranial hemorrhage, definite infarction, mass lesion, midline shift, herniation, or extra-axial fluid collection. Cisterns patent.  No cerebellar abnormality.  Mastoids and sinuses clear.  Overall stable exam.  IMPRESSION: Stable atrophy and chronic microvascular ischemic changes in the white matter.  No interval change or acute process by noncontrast CT.   Original Report Authenticated By: Judie Petit. Shick, M.D.    Scheduled Meds: . aspirin EC  325 mg Oral Daily  . cloNIDine  0.1 mg Oral BID  . ezetimibe  10 mg Oral Daily  . fenofibrate  160 mg Oral Daily  . gabapentin  100 mg Oral TID  . heparin  5,000 Units Subcutaneous Q8H  . hydrALAZINE  50 mg  Oral TID  . insulin aspart  0-9 Units Subcutaneous TID WC  . loratadine  10 mg Oral Daily  . nebivolol  10 mg Oral QHS  . pantoprazole  40 mg Oral Daily  . potassium chloride  40 mEq Oral Once  . simvastatin  20 mg Oral QHS  . sodium chloride  3 mL Intravenous Q12H   Continuous Infusions: . sodium chloride 125 mL/hr at 05/28/12 1610    Principal Problem:   Hyperkalemia, diminished renal excretion Active Problems:   AKI (acute kidney injury)   Dehydration   DM2 (diabetes mellitus, type 2)   HTN (hypertension)   Dementia   Adult failure to thrive       [facility]  [facility] Pager [phone]. If 7PM-7AM, please contact night-coverage at [email or web address], password [facility] 05/28/2012, 12:04 PM  LOS: 1 day

## 2012-05-29 LAB — BASIC METABOLIC PANEL
Chloride: 97 mEq/L (ref 96–112)
Creatinine, Ser: 1.43 mg/dL — ABNORMAL HIGH (ref 0.50–1.10)
GFR calc Af Amer: 41 mL/min — ABNORMAL LOW (ref >90)
Potassium: 3.2 mEq/L — ABNORMAL LOW (ref 3.5–5.1)

## 2012-05-29 LAB — MAGNESIUM: Magnesium: 1.9 mg/dL (ref 1.5–2.5)

## 2012-05-29 LAB — GLUCOSE, CAPILLARY: Glucose-Capillary: 99 mg/dL (ref 70–99)

## 2012-05-29 MED ORDER — SODIUM GLYCEROPHOSPHATE 1 MMOLE/ML IV SOLN
20.0000 mmol | Freq: Once | INTRAVENOUS | Status: AC
  Administered 2012-05-29: 20 mmol via INTRAVENOUS
  Filled 2012-05-29: qty 20

## 2012-05-29 MED ORDER — POTASSIUM CHLORIDE CRYS ER 20 MEQ PO TBCR
40.0000 meq | EXTENDED_RELEASE_TABLET | Freq: Once | ORAL | Status: AC
  Administered 2012-05-29: 40 meq via ORAL
  Filled 2012-05-29: qty 2

## 2012-05-29 MED ORDER — SODIUM PHOSPHATE 3 MMOLE/ML IV SOLN
20.0000 mmol | Freq: Once | INTRAVENOUS | Status: DC
  Filled 2012-05-29: qty 6.67

## 2012-05-29 MED ORDER — MAGNESIUM SULFATE IN D5W 10-5 MG/ML-% IV SOLN
1.0000 g | Freq: Once | INTRAVENOUS | Status: AC
  Administered 2012-05-29: 1 g via INTRAVENOUS
  Filled 2012-05-29 (×2): qty 100

## 2012-05-29 NOTE — Evaluation (Signed)
Physical Therapy Evaluation Patient Details Name: Paula [name] MRN: [id number] DOB: 11-03-1940 Today's Date: 05/29/2012 Time: 8295-6213 PT Time Calculation (min): 20 min  PT Assessment / Plan / Recommendation Clinical Impression  Paula [name] is a 72 y.o. female with baseline dementia who lives at home with family and caretaker.  Caretaker brought patient to ER today because patient has a 1 month history of gradually worsening decreased appetite and failure to thrive.  Pt appears to be functioning at or near her baseline level.  Acute PT will see pt once more to insure consistely safe with mobility. Pt may benefit from use of RW to increase safety and minimize fatigue when ambulating.      PT Assessment  Patient needs continued PT services    Follow Up Recommendations  No PT follow up    Does the patient have the potential to tolerate intense rehabilitation      Barriers to Discharge None      Equipment Recommendations  Rolling walker with 5" wheels    Recommendations for Other Services     Frequency Min 2X/week    Precautions / Restrictions Precautions Precautions: Fall Restrictions Weight Bearing Restrictions: No   Pertinent Vitals/Pain No pain      Mobility  Bed Mobility Bed Mobility: Supine to Sit Supine to Sit: 6: Modified independent (Device/Increase time);HOB elevated Transfers Transfers: Stand to Sit;Sit to Stand Sit to Stand: 5: Supervision;From bed;With upper extremity assist Stand to Sit: 5: Supervision;To toilet;With upper extremity assist Details for Transfer Assistance: Supervision for safety Ambulation/Gait Ambulation/Gait Assistance: 5: Supervision Ambulation Distance (Feet): 100 Feet Assistive device: None Ambulation/Gait Assistance Details: pt touching the guardrail in the hall intermittently.  Frequency of  gaurdrail use increased as pt fatigued.   Gait Pattern: Within Functional Limits Stairs: No Wheelchair Mobility Wheelchair  Mobility: No    Exercises     PT Diagnosis: Altered mental status  PT Problem List: Decreased knowledge of use of DME;Decreased activity tolerance PT Treatment Interventions: Gait training;Functional mobility training;Patient/family education;DME instruction   PT Goals Acute Rehab PT Goals PT Goal Formulation: With patient Time For Goal Achievement: 06/05/12 Potential to Achieve Goals: Good Pt will Ambulate: >150 feet;with modified independence;with rolling walker PT Goal: Ambulate - Progress: Goal set today  Visit Information  Last PT Received On: 05/29/12 Assistance Needed: +1    Subjective Data  Subjective: Agree to PT eval    Prior Functioning  Home Living Lives With: Daughter;Family Available Help at Discharge: Available 24 hours/day;Family;Personal care attendant Type of Home: House Home Layout: One level Home Adaptive Equipment: None Prior Function Level of Independence: Independent Able to Take Stairs?: Yes Driving: No Vocation: Retired Musician: No difficulties    Copywriter, advertising Arousal/Alertness: Awake/alert Behavior During Therapy: WFL for tasks assessed/performed Overall Cognitive Status: History of cognitive impairments - at baseline    Extremity/Trunk Assessment Right Upper Extremity Assessment RUE ROM/Strength/Tone: Within functional levels Left Upper Extremity Assessment LUE ROM/Strength/Tone: Within functional levels Right Lower Extremity Assessment RLE ROM/Strength/Tone: Within functional levels Left Lower Extremity Assessment LLE ROM/Strength/Tone: Within functional levels   Balance Balance Balance Assessed: No  End of Session PT - End of Session Equipment Utilized During Treatment: Gait belt Activity Tolerance: Patient tolerated treatment well Patient left: in chair;with nursing in room Nurse Communication: Mobility status  GP     Paula [name] 05/29/2012, 4:14 PM Paula [name] L. Chilton Si, DPT   Pager [phone]     Cell  [phone]

## 2012-05-29 NOTE — Clinical Documentation Improvement (Signed)
MALNUTRITION DOCUMENTATION CLARIFICATION  THIS DOCUMENT IS NOT A PERMANENT PART OF THE MEDICAL RECORD  TO RESPOND TO THE THIS QUERY, FOLLOW THE INSTRUCTIONS BELOW:  1. If needed, update documentation for the patient's encounter via the notes activity.  2. Access this query again and click edit on the In [employer].  3. After updating, or not, click F2 to complete all highlighted (required) fields concerning your review. Select "additional documentation in the medical record" OR "no additional documentation provided".  4. Click Sign note button.  5. The deficiency will fall out of your In Basket *Please let us know if you are not able to complete this workflow by phone or e-mail (listed below).  Please update your documentation within the medical record to reflect your response to this query.                                                                                        05/29/12   Kathlen Mody, MD  / Associates,  In a better effort to capture your patient's severity of illness, reflect appropriate length of stay and utilization of resources, a review of the patient medical record has revealed the following indicators.    Based on your clinical judgment, please clarify and document in a progress note and/or discharge summary the clinical condition associated with the following supporting information:  In responding to this query please exercise your independent judgment.  The fact that a query is asked, does not imply that any particular answer is desired or expected.  Per nutritionist notes on 5/28 and 5/29 patient meets criteria for "Severe malnutrition in the context of chronic illness "  with "12% wt loss in 6 months and intake of <75% of estimated needs x at least 1 month", and BMI 27.92 kg/(m^2), please document the nutritional condition if known. Thank you       Possible Clinical Conditions?  Severe Malnutrition    Protein Calorie Malnutrition   Severe Protein  Calorie Malnutrition   Cachexia    Other Condition  Cannot clinically determine     Signs & Symptoms: Ht 5\' 5"  (1.651 m)   Wt 167 lb 12.3 oz (76.1 kg)    BMI:  27.92 kg/(m^2)   Weight  Loss 12 % over 6 months         You may use possible, probable, or suspect with inpatient documentation. possible, probable, suspected diagnoses MUST be documented at the time of discharge  Reviewed:   Thank You,  Lavonda Jumbo  Clinical Documentation Specialist: Pager [phone]  Health Information Management [place]

## 2012-05-29 NOTE — Progress Notes (Signed)
NUTRITION FOLLOW UP  DOCUMENTATION CODES  Per approved criteria   -Severe malnutrition in the context of chronic illness    Intervention:   1. Now that potassium is beginning to normalize, strongly recommend diet liberalization to Regular.  2. Monitor magnesium, potassium, and phosphorus daily for at least 3 days, MD to replete as needed, as pt is at risk for refeeding syndrome given dx of severe malnutrition.   3. RD to add oral nutrition supplement once lytes more stable.  4. RD to continue to follow nutrition care plan  Nutrition Dx:   Inadequate oral intake related to dementia and FTT as evidenced by caretaker report and ongoing weight loss. Improved.  Goal:   Intake to meet >90% of estimated nutrition needs. Met.  Monitor:   weight trends, lab trends, I/O's, PO intake, supplement tolerance  Assessment:   Hx of baseline dementia. Lives at home with family and caretaker. Per caregiver, over the past month, pt has become very confused, eating minimally, and increased sleeping. PO intake has decreased to the point where she is no longer eating or drinking anything, pt was then brought into the ED.  Work-up reveals dehydration and AKI. Received kayexalate on admission 2/2 hyperkalemia. Pt consumed entire Ensure Complete on admission and experienced further drop in potassium. Now eating very well - consuming 100%. MD aware of refeeding labs, magnesium and phosphorus obtained on admission. Phosphorus and potassium are low and magnesium WNL. Currently ordered for glycophos and KCL for repletion.   Pt meets criteria for severe MALNUTRITION in the context of chronic illness as evidenced by 12% wt loss in 6 months and intake of <75% of estimated needs x at least 1 month.   Height: Ht Readings from Last 1 Encounters:  05/28/12 5\' 5"  (1.651 m)    Weight Status:   Wt Readings from Last 1 Encounters:  05/28/12 173 lb 8 oz (78.699 kg)  Wt up 5 lb since admit  Re-estimated needs:  Kcal:  1550 - 1750 Protein: 75 - 85 grams Fluid: at least 1.8 liters  Skin: intact  Diet Order: Renal 60-70 diet   Intake/Output Summary (Last 24 hours) at 05/29/12 1042 Last data filed at 05/29/12 0800  Gross per 24 hour  Intake    960 ml  Output    975 ml  Net    -15 ml    Last BM: 5/28   Labs:   Recent Labs Lab 05/27/12 1806  05/28/12 0408 05/28/12 0620 05/29/12 0937  NA 128*  --   --  135 131*  K 6.1*  < > 3.1* 3.2* 3.2*  CL 91*  --   --  98 97  CO2 17*  --   --  25 20  BUN 68*  --   --  54* 32*  CREATININE 2.09*  --   --  1.71* 1.43*  CALCIUM 12.4*  --   --  10.7* 9.8  MG  --   --   --  2.2  --   PHOS  --   --   --  1.9*  --   GLUCOSE 131*  --   --  195* 155*  < > = values in this interval not displayed.  CBG (last 3)   Recent Labs  05/28/12 1711 05/28/12 2115 05/29/12 0751  GLUCAP 146* 104* 99    Scheduled Meds: . aspirin EC  325 mg Oral Daily  . cloNIDine  0.1 mg Oral BID  . ezetimibe  10 mg  Oral Daily  . fenofibrate  160 mg Oral Daily  . gabapentin  100 mg Oral TID  . heparin  5,000 Units Subcutaneous Q8H  . hydrALAZINE  50 mg Oral TID  . insulin aspart  0-9 Units Subcutaneous TID WC  . loratadine  10 mg Oral Daily  . nebivolol  10 mg Oral QHS  . pantoprazole  40 mg Oral Daily  . simvastatin  20 mg Oral QHS  . sodium chloride  3 mL Intravenous Q12H    Continuous Infusions: . sodium chloride 125 mL/hr at 05/28/12 [address] MS, RD, LDN Pager: [phone] After-hours pager: [phone]

## 2012-05-29 NOTE — Progress Notes (Signed)
[facility] PROGRESS NOTE  Paula [name]:[id number] DOB: March 17, 1940 DOA: 05/27/2012 PCP: No primary provider on file.  Assessment/Plan: 1. Hyperkalemia: resolved.  2. Dehydration; improving.  3. Diabetes Mellitus: hgba1c is pending.   CBG (last 3)   Recent Labs  05/28/12 2115 05/29/12 0751 05/29/12 1217  GLUCAP 104* 99 177*    Resume SSI for now.   4. Hypertension; holding lisinopril and diuretics for renal insufficiency. Better controlled.   5. Acute on CKD stage 3 : probably pre renal in origin, in view of the hypotension on admission. Gentle hydration and repeat levels have improved to 1.43.   6. Hypokalemia. Hypophosphatemia: replete as needed.   7. Slightly elevated troponin: possibly from acute renal failure and demand ischemia from hypotension. She is asymptomatic, denies any chest pain . REPEAT EKG pending.  8. DVT prophylaxis   Code Status: full code Family Communication: none at bedside Disposition Plan: pending PT eval.   Consultants:  none  Procedures:  none  Antibiotics:  none  HPI/Subjective: Alert and comfortable.   Objective: Filed Vitals:   05/28/12 2018 05/29/12 0503 05/29/12 0922 05/29/12 1416  BP: 108/66 113/61 90/47 91/51   Pulse: 60 55 47 58  Temp: 97.6 F (36.4 C) 98.5 F (36.9 C) 97.3 F (36.3 C) 98 F (36.7 C)  TempSrc: Oral Oral Oral Oral  Resp: 18 20 17 18   Height: 5\' 5"  (1.651 m)     Weight: 78.699 kg (173 lb 8 oz)     SpO2: 94% 99% 100% 100%    Intake/Output Summary (Last 24 hours) at 05/29/12 1603 Last data filed at 05/29/12 1400  Gross per 24 hour  Intake   1955 ml  Output   1425 ml  Net    530 ml   Filed Weights   05/28/12 0305 05/28/12 2018  Weight: 76.1 kg (167 lb 12.3 oz) 78.699 kg (173 lb 8 oz)    Exam:  Cardiovascular: RRR w/o MRG  Respiratory: CTA B  Abdomen: soft, nt, nd, bs+  Musculoskeletal: MAE, full ROM all 4 extremities Neurologic: demented, non-focal, cooperative at times,  follows simple commands, oriented to person only but this is baseline for patient.   Data Reviewed: Basic Metabolic Panel:  Recent Labs Lab 05/27/12 1516 05/27/12 1806 05/27/12 2313 05/28/12 0408 05/28/12 0620 05/29/12 0937  NA 126* 128*  --   --  135 131*  K 6.6* 6.1* 3.3* 3.1* 3.2* 3.2*  CL 89* 91*  --   --  98 97  CO2 21 17*  --   --  25 20  GLUCOSE 147* 131*  --   --  195* 155*  BUN 67* 68*  --   --  54* 32*  CREATININE 2.19* 2.09*  --   --  1.71* 1.43*  CALCIUM 12.3* 12.4*  --   --  10.7* 9.8  MG  --   --   --   --  2.2 1.9  PHOS  --   --   --   --  1.9* 2.9   Liver Function Tests:  Recent Labs Lab 05/27/12 1516 05/27/12 1806  AST 51* 54*  ALT 30 27  ALKPHOS 21* 22*  BILITOT 0.4 0.4  PROT 8.3 8.4*  ALBUMIN 4.2 3.9   No results found for this basename: LIPASE, AMYLASE,  in the last 168 hours No results found for this basename: AMMONIA,  in the last 168 hours CBC:  Recent Labs Lab 05/27/12 1516 05/28/12 0620  WBC 5.3 5.4  NEUTROABS 3.3  --   HGB 14.4 12.2  HCT 42.3 37.1  MCV 85.8 87.1  PLT PLATELET CLUMPS NOTED ON SMEAR, UNABLE TO ESTIMATE 221   Cardiac Enzymes: No results found for this basename: CKTOTAL, CKMB, CKMBINDEX, TROPONINI,  in the last 168 hours BNP (last 3 results) No results found for this basename: PROBNP,  in the last 8760 hours CBG:  Recent Labs Lab 05/28/12 1143 05/28/12 1711 05/28/12 2115 05/29/12 0751 05/29/12 1217  GLUCAP 98 146* 104* 99 177*    Recent Results (from the past 240 hour(s))  CULTURE, BLOOD (ROUTINE X 2)     Status: None   Collection Time    05/27/12  3:38 PM      Result Value Range Status   Specimen Description BLOOD RIGHT HAND   Final   Special Requests BOTTLES DRAWN AEROBIC ONLY   Final   Culture  Setup Time 05/27/2012 20:35   Final   Culture     Final   Value:        BLOOD CULTURE RECEIVED NO GROWTH TO DATE CULTURE WILL BE HELD FOR 5 DAYS BEFORE ISSUING A FINAL NEGATIVE REPORT   Report Status  PENDING   Incomplete  CULTURE, BLOOD (ROUTINE X 2)     Status: None   Collection Time    05/27/12  3:42 PM      Result Value Range Status   Specimen Description BLOOD RIGHT ARM   Final   Special Requests BOTTLES DRAWN AEROBIC ONLY   Final   Culture  Setup Time 05/27/2012 20:36   Final   Culture     Final   Value:        BLOOD CULTURE RECEIVED NO GROWTH TO DATE CULTURE WILL BE HELD FOR 5 DAYS BEFORE ISSUING A FINAL NEGATIVE REPORT   Report Status PENDING   Incomplete  URINE CULTURE     Status: None   Collection Time    05/27/12  4:12 PM      Result Value Range Status   Specimen Description URINE, CATHETERIZED   Final   Special Requests NONE   Final   Culture  Setup Time 05/27/2012 16:23   Final   Colony Count NO GROWTH   Final   Culture NO GROWTH   Final   Report Status 05/28/2012 FINAL   Final     Studies: Dg Chest 2 View  05/27/2012   *RADIOLOGY REPORT*  Clinical Data: Altered mental status, history of diabetes, hypertension  CHEST - 2 VIEW  Comparison: 02/18/2012  Findings: Left subclavian AICD / pacer noted.  Mild cardiomegaly without CHF.  Low lung volumes persist with residual atelectasis. No new consolidation, collapse, edema, effusion, or pneumothorax. Trachea is midline.  IMPRESSION: Low volume exam with atelectasis.   Original Report Authenticated By: Judie Petit. Miles Costain, M.D.   Ct Head Wo Contrast  05/27/2012   *RADIOLOGY REPORT*  Clinical Data: Altered mental status, increased confusion  CT HEAD WITHOUT CONTRAST  Technique:  Contiguous axial images were obtained from the base of the skull through the vertex without contrast.  Comparison: 02/18/2012  Findings: Stable brain atrophy and ventricular enlargement. Moderate to severe periventricular white matter hypoattenuation worse on the left compatible with chronic microvascular ischemic changes.  Remote right basal ganglia lacunar type infarcts.  No acute intracranial hemorrhage, definite infarction, mass lesion, midline shift,  herniation, or extra-axial fluid collection. Cisterns patent.  No cerebellar abnormality.  Mastoids and sinuses clear.  Overall stable exam.  IMPRESSION: Stable atrophy  and chronic microvascular ischemic changes in the white matter.  No interval change or acute process by noncontrast CT.   Original Report Authenticated By: Judie Petit. Shick, M.D.    Scheduled Meds: . aspirin EC  325 mg Oral Daily  . cloNIDine  0.1 mg Oral BID  . ezetimibe  10 mg Oral Daily  . fenofibrate  160 mg Oral Daily  . gabapentin  100 mg Oral TID  . heparin  5,000 Units Subcutaneous Q8H  . hydrALAZINE  50 mg Oral TID  . insulin aspart  0-9 Units Subcutaneous TID WC  . loratadine  10 mg Oral Daily  . magnesium sulfate 1 - 4 g bolus IVPB  1 g Intravenous Once  . pantoprazole  40 mg Oral Daily  . potassium chloride  40 mEq Oral Once  . simvastatin  20 mg Oral QHS  . sodium chloride  3 mL Intravenous Q12H  . sodium phosphate  Dextrose 5% IVPB  20 mmol Intravenous Once   Continuous Infusions: . sodium chloride 125 mL/hr at 05/28/12 1638    Principal Problem:   Hyperkalemia, diminished renal excretion Active Problems:   AKI (acute kidney injury)   Dehydration   DM2 (diabetes mellitus, type 2)   HTN (hypertension)   Dementia   Adult failure to thrive       [facility]  [facility] Pager [phone]. If 7PM-7AM, please contact night-coverage at [email or web address], password [facility] 05/29/2012, 4:03 PM  LOS: 2 days

## 2012-05-30 LAB — BASIC METABOLIC PANEL
Calcium: 9.1 mg/dL (ref 8.4–10.5)
GFR calc non Af Amer: 37 mL/min — ABNORMAL LOW (ref >90)
Glucose, Bld: 130 mg/dL — ABNORMAL HIGH (ref 70–99)
Sodium: 133 mEq/L — ABNORMAL LOW (ref 135–145)

## 2012-05-30 LAB — GLUCOSE, CAPILLARY
Glucose-Capillary: 107 mg/dL — ABNORMAL HIGH (ref 70–99)
Glucose-Capillary: 182 mg/dL — ABNORMAL HIGH (ref 70–99)
Glucose-Capillary: 204 mg/dL — ABNORMAL HIGH (ref 70–99)

## 2012-05-30 LAB — PHOSPHORUS: Phosphorus: 2.3 mg/dL (ref 2.3–4.6)

## 2012-05-30 MED ORDER — SODIUM CHLORIDE 0.9 % IJ SOLN
10.0000 mL | Freq: Two times a day (BID) | INTRAMUSCULAR | Status: DC
  Administered 2012-05-30: 10 mL

## 2012-05-30 MED ORDER — SODIUM CHLORIDE 0.9 % IJ SOLN
10.0000 mL | INTRAMUSCULAR | Status: DC | PRN
  Administered 2012-05-30: 30 mL

## 2012-05-30 NOTE — Progress Notes (Signed)
[facility] PROGRESS NOTE  Paula [name] ZOX:[id number] DOB: 01-12-1940 DOA: 05/27/2012 PCP: No primary provider on file.  Assessment/Plan: 1. Hyperkalemia: resolved.  2. Dehydration; improving.  3. Diabetes Mellitus: hgba1c is ordered.   CBG (last 3)   Recent Labs  05/30/12 0735 05/30/12 1133 05/30/12 1627  GLUCAP 88 107* 182*    Resume SSI for now.   4. Hypertension; holding lisinopril and diuretics for renal insufficiency. Better controlled.   5. Acute on CKD stage 3 : probably pre renal in origin, in view of the hypotension on admission. Gentle hydration and repeat levels have improved to 1.38.   6. Hypokalemia. Hypophosphatemia: replete as needed.   7. Slightly elevated troponin: possibly from acute renal failure and demand ischemia from hypotension. She is asymptomatic, denies any chest pain . REPEAT EKG is unchanged from before.   8. DVT prophylaxis   Code Status: full code Family Communication: none at bedside Disposition Plan: pending PT eval.   Consultants:  none  Procedures:  none  Antibiotics:  none  HPI/Subjective: Alert and comfortable.   Objective: Filed Vitals:   05/29/12 1416 05/29/12 1730 05/29/12 2115 05/30/12 0920  BP: 91/51 118/56 96/59 140/76  Pulse: 58 65 60 59  Temp: 98 F (36.7 C) 97.9 F (36.6 C) 99.1 F (37.3 C) 98.3 F (36.8 C)  TempSrc: Oral Oral Oral Oral  Resp: 18 17 18 20   Height:      Weight:   80.74 kg (178 lb)   SpO2: 100% 98% 94% 100%    Intake/Output Summary (Last 24 hours) at 05/30/12 1811 Last data filed at 05/30/12 1429  Gross per 24 hour  Intake 2915.42 ml  Output    400 ml  Net 2515.42 ml   Filed Weights   05/28/12 0305 05/28/12 2018 05/29/12 2115  Weight: 76.1 kg (167 lb 12.3 oz) 78.699 kg (173 lb 8 oz) 80.74 kg (178 lb)    Exam:  Cardiovascular: RRR w/o MRG  Respiratory: CTA B  Abdomen: soft, nt, nd, bs+  Musculoskeletal: MAE, full ROM all 4 extremities Neurologic: demented,  non-focal, cooperative at times, follows simple commands, oriented to person only but this is baseline for patient.   Data Reviewed: Basic Metabolic Panel:  Recent Labs Lab 05/27/12 1516 05/27/12 1806 05/27/12 2313 05/28/12 0408 05/28/12 0620 05/29/12 0937 05/30/12 0420  NA 126* 128*  --   --  135 131* 133*  K 6.6* 6.1* 3.3* 3.1* 3.2* 3.2* 3.9  CL 89* 91*  --   --  98 97 103  CO2 21 17*  --   --  25 20 23   GLUCOSE 147* 131*  --   --  195* 155* 130*  BUN 67* 68*  --   --  54* 32* 24*  CREATININE 2.19* 2.09*  --   --  1.71* 1.43* 1.38*  CALCIUM 12.3* 12.4*  --   --  10.7* 9.8 9.1  MG  --   --   --   --  2.2 1.9 2.0  PHOS  --   --   --   --  1.9* 2.9 2.3   Liver Function Tests:  Recent Labs Lab 05/27/12 1516 05/27/12 1806  AST 51* 54*  ALT 30 27  ALKPHOS 21* 22*  BILITOT 0.4 0.4  PROT 8.3 8.4*  ALBUMIN 4.2 3.9   No results found for this basename: LIPASE, AMYLASE,  in the last 168 hours No results found for this basename: AMMONIA,  in the last 168 hours  CBC:  Recent Labs Lab 05/27/12 1516 05/28/12 0620  WBC 5.3 5.4  NEUTROABS 3.3  --   HGB 14.4 12.2  HCT 42.3 37.1  MCV 85.8 87.1  PLT PLATELET CLUMPS NOTED ON SMEAR, UNABLE TO ESTIMATE 221   Cardiac Enzymes: No results found for this basename: CKTOTAL, CKMB, CKMBINDEX, TROPONINI,  in the last 168 hours BNP (last 3 results) No results found for this basename: PROBNP,  in the last 8760 hours CBG:  Recent Labs Lab 05/29/12 1729 05/29/12 2108 05/30/12 0735 05/30/12 1133 05/30/12 1627  GLUCAP 118* 181* 88 107* 182*    Recent Results (from the past 240 hour(s))  CULTURE, BLOOD (ROUTINE X 2)     Status: None   Collection Time    05/27/12  3:38 PM      Result Value Range Status   Specimen Description BLOOD RIGHT HAND   Final   Special Requests BOTTLES DRAWN AEROBIC ONLY   Final   Culture  Setup Time 05/27/2012 20:35   Final   Culture     Final   Value:        BLOOD CULTURE RECEIVED NO GROWTH TO  DATE CULTURE WILL BE HELD FOR 5 DAYS BEFORE ISSUING A FINAL NEGATIVE REPORT   Report Status PENDING   Incomplete  CULTURE, BLOOD (ROUTINE X 2)     Status: None   Collection Time    05/27/12  3:42 PM      Result Value Range Status   Specimen Description BLOOD RIGHT ARM   Final   Special Requests BOTTLES DRAWN AEROBIC ONLY   Final   Culture  Setup Time 05/27/2012 20:36   Final   Culture     Final   Value:        BLOOD CULTURE RECEIVED NO GROWTH TO DATE CULTURE WILL BE HELD FOR 5 DAYS BEFORE ISSUING A FINAL NEGATIVE REPORT   Report Status PENDING   Incomplete  URINE CULTURE     Status: None   Collection Time    05/27/12  4:12 PM      Result Value Range Status   Specimen Description URINE, CATHETERIZED   Final   Special Requests NONE   Final   Culture  Setup Time 05/27/2012 16:23   Final   Colony Count NO GROWTH   Final   Culture NO GROWTH   Final   Report Status 05/28/2012 FINAL   Final     Studies: No results found.  Scheduled Meds: . aspirin EC  325 mg Oral Daily  . ezetimibe  10 mg Oral Daily  . fenofibrate  160 mg Oral Daily  . gabapentin  100 mg Oral TID  . heparin  5,000 Units Subcutaneous Q8H  . insulin aspart  0-9 Units Subcutaneous TID WC  . loratadine  10 mg Oral Daily  . pantoprazole  40 mg Oral Daily  . simvastatin  20 mg Oral QHS  . sodium chloride  3 mL Intravenous Q12H   Continuous Infusions: . sodium chloride 125 mL/hr at 05/30/12 0343    Principal Problem:   Hyperkalemia, diminished renal excretion Active Problems:   AKI (acute kidney injury)   Dehydration   DM2 (diabetes mellitus, type 2)   HTN (hypertension)   Dementia   Adult failure to thrive       [facility]  [facility] Pager [phone]. If 7PM-7AM, please contact night-coverage at [email or web address], password [email or web address] 05/30/2012, 6:11 PM  LOS: 3 days

## 2012-05-30 NOTE — Evaluation (Signed)
Occupational Therapy Evaluation and Discharge Patient Details Name: Paula [name] MRN: [id number] DOB: [birth year] Today's Date: 05/30/2012 Time: 4098-1191 OT Time Calculation (min): 18 min  OT Assessment / Plan / Recommendation Clinical Impression  This 72 yo female admiited with AMS and found to have hyperkalemia, diminished renal excretion presents to acute OT at baseline level of functioning per pt and is moving about room and unit at S/min guard A level with RW. No further OT needs found, will sign off.    OT Assessment  Patient does not need any further OT services    Follow Up Recommendations  No OT follow up       Equipment Recommendations  None recommended by OT          Precautions / Restrictions Precautions Precautions: Fall Restrictions Weight Bearing Restrictions: No       ADL  Equipment Used: Rolling walker;Gait belt Transfers/Ambulation Related to ADLs: S for sit to stand and stand to sit, Min guard  A for ambulation with RW ADL Comments: Pt min A for LBADLs, S for UBADLs        Visit Information  Last OT Received On: 05/30/12 Assistance Needed: +1    Subjective Data  Subjective: "Oh, I would love to get up and walk now" and post the walk "thank you so much for letting me do this"   Prior Functioning     Home Living Lives With: Daughter;Family Available Help at Discharge: Available 24 hours/day;Family;Personal care attendant Type of Home: House Home Layout: One level Bathroom Shower/Tub: Tub/shower unit;Curtain Firefighter: Standard Home Adaptive Equipment: None Prior Function Level of Independence: Needs assistance Needs Assistance: Bathing;Dressing;Grooming;Toileting;Meal Prep;Light Housekeeping;Gait;Transfers Bath: Supervision/set-up Dressing: Supervision/set-up Grooming: Supervision/set-up Toileting: Supervision/set-up Meal Prep: Total Light Housekeeping: Total Gait Assistance: S Transfer Assistance: S Able to Take Stairs?:  Yes Driving: No Vocation: Retired Musician: No difficulties Dominant Hand: Right         Vision/Perception Vision - History Baseline Vision: Wears glasses all the time   Cognition  Cognition Arousal/Alertness: Awake/alert Behavior During Therapy: WFL for tasks assessed/performed Overall Cognitive Status: History of cognitive impairments - at baseline    Extremity/Trunk Assessment Right Upper Extremity Assessment RUE ROM/Strength/Tone: Within functional levels Left Upper Extremity Assessment LUE ROM/Strength/Tone: Within functional levels     Mobility Bed Mobility Bed Mobility: Supine to Sit;Sit to Supine Supine to Sit: 6: Modified independent (Device/Increase time);With rails;HOB elevated Sit to Supine: 6: Modified independent (Device/Increase time);With rail;HOB elevated Transfers Transfers: Sit to Stand;Stand to Sit Sit to Stand: 5: Supervision;With upper extremity assist;From bed Stand to Sit: 5: Supervision;With upper extremity assist;To bed           End of Session OT - End of Session Equipment Utilized During Treatment: Gait belt Activity Tolerance: Patient tolerated treatment well Patient left: in bed;with call bell/phone within reach;with bed alarm set    Evette Georges [phone] 05/30/2012, 5:30 PM

## 2012-05-31 LAB — BASIC METABOLIC PANEL
CO2: 19 mEq/L (ref 19–32)
Calcium: 9.6 mg/dL (ref 8.4–10.5)
Chloride: 101 mEq/L (ref 96–112)
Sodium: 131 mEq/L — ABNORMAL LOW (ref 135–145)

## 2012-05-31 LAB — GLUCOSE, CAPILLARY
Glucose-Capillary: 117 mg/dL — ABNORMAL HIGH (ref 70–99)
Glucose-Capillary: 198 mg/dL — ABNORMAL HIGH (ref 70–99)

## 2012-05-31 MED ORDER — ASPIRIN EC 325 MG PO TBEC: 325.0000 mg | DELAYED_RELEASE_TABLET | Freq: Every day | ORAL | Status: DC

## 2012-05-31 MED ORDER — ISOSORBIDE MONONITRATE ER 30 MG PO TB24: 30.0000 mg | ORAL_TABLET | Freq: Every day | ORAL | Status: DC

## 2012-05-31 MED ORDER — INSULIN GLARGINE 100 UNIT/ML ~~LOC~~ SOLN: 5.0000 [IU] | Freq: Every day | SUBCUTANEOUS | Status: DC

## 2012-05-31 NOTE — Progress Notes (Signed)
NCM spoke to pt and states to call her dtr for ride for home. Attempted call to both numbers for dtr's on pt's file. Both numbers are non-working numbers. Isidoro Donning RN CCM Case Mgmt phone [phone]

## 2012-05-31 NOTE — Discharge Summary (Signed)
Physician Discharge Summary  Paula [name] NWG:[id number] DOB: [birth year] DOA: 05/27/2012  PCP: No primary provider on file.  Admit date: 05/27/2012 Discharge date: 05/31/2012  Time spent: 35 minutes  Recommendations for Outpatient Follow-up:  1. Follow up with BMP i one week 2. Follow up with PCP in one week.   Discharge Diagnoses:  Principal Problem:   Hyperkalemia, diminished renal excretion Active Problems:   AKI (acute kidney injury)   Dehydration   DM2 (diabetes mellitus, type 2)   HTN (hypertension)   Dementia   Adult failure to thrive   Discharge Condition: improved.   Diet recommendation: low sodium diet  Filed Weights   05/28/12 2018 05/29/12 2115 05/30/12 2112  Weight: 78.699 kg (173 lb 8 oz) 80.74 kg (178 lb) 80.74 kg (178 lb)    History of present illness:  Paula [name] is a 72 y.o. female with baseline dementia who lives at home with family and caretaker. Caretaker brought patient to ER today because patient has a 1 month history of gradually worsening decreased appetite and failure to thrive. PO intake has decreased to the point where patient will not eat or drink anything so they brought her to the ED because they felt she likely needed IV fluids. In the ED the patient's lab workup was consistent with fairly profound dehydration, AKI and hyperkalemia of 6.1. UA was negative, IV access was very difficult due to her dehydration and as a result a central IVC had to be placed. Hospitalist has been asked to admit. She was started on IV fluids and her hyperkalemia and dehydration resolved. Her renal insufficiency improved. Her diuretics and ace inhibitor was stopped and she is recommended to check bMP ino ne week and follow up with PCP in one week.    Hospital Course:   1. Hyperkalemia: resolved.  2. Dehydration; RESOLVED with IV fluids.  3. Diabetes Mellitus: hgba1c is  6.9. She will be discharged on 5 units of lantus. Decreased from her home dose of 16 units  of lantus.  CBG (last 3)   Recent Labs   05/30/12 0735  05/30/12 1133  05/30/12 1627   GLUCAP  88  107*  182*     4. Hypertension; holding lisinopril and diuretics for renal insufficiency. Better controlled.  5. Acute on CKD stage 3 : probably pre renal in origin, in view of the hypotension on admission. Gentle hydration and repeat levels have improved to 1.38.  6. Hypokalemia. Hypophosphatemia: replete as needed.  7. Slightly elevated troponin: possibly from acute renal failure and demand ischemia from hypotension. She is asymptomatic, denies any chest pain . REPEAT EKG is unchanged from before. Recommended to follow up with PCP in one week.   Procedures:  NONE)  Consultations:  NONE  Discharge Exam: Filed Vitals:   05/30/12 2112 05/31/12 0616 05/31/12 0735 05/31/12 1012  BP: 119/55 139/74 131/65 121/64  Pulse: 63 69 71 60  Temp: 98.2 F (36.8 C) 98.3 F (36.8 C) 97.4 F (36.3 C)   TempSrc: Oral Oral Oral   Resp: 20 20 20    Height: 5\' 5"  (1.651 m)     Weight: 80.74 kg (178 lb)     SpO2: 100% 100% 100%     Cardiovascular: RRR w/o MRG  Respiratory: CTA B  Abdomen: soft, nt, nd, bs+  Musculoskeletal: MAE, full ROM all 4 extremities  Neurologic: demented, non-focal, cooperative at times, follows simple commands, oriented to person only but this is baseline for patient.   Discharge Instructions  Discharge Orders   Future Orders Complete By Expires     Discharge instructions  As directed     Comments:      Follow up with PCP in one week Please check BMP in one week        Medication List    STOP taking these medications       chlorthalidone 25 MG tablet  Commonly known as:  HYGROTON     furosemide 20 MG tablet  Commonly known as:  LASIX     lisinopril 40 MG tablet  Commonly known as:  PRINIVIL,ZESTRIL     potassium chloride SA 20 MEQ tablet  Commonly known as:  K-DUR,KLOR-CON      TAKE these medications       alendronate 70 MG tablet  Commonly  known as:  FOSAMAX  Take 1 tablet (70 mg total) by mouth every 7 (seven) days. Take with a full glass of water on an empty stomach. On Wednesdays     aspirin EC 325 MG tablet  Take 1 tablet (325 mg total) by mouth daily.     ezetimibe 10 MG tablet  Commonly known as:  ZETIA  Take 10 mg by mouth daily.     fenofibrate 160 MG tablet  Take 160 mg by mouth daily.     Fish Oil 1200 MG Caps  Take 1 capsule by mouth daily.     insulin glargine 100 UNIT/ML injection  Commonly known as:  LANTUS  Inject 0.05 mLs (5 Units total) into the skin at bedtime.     isosorbide mononitrate 30 MG 24 hr tablet  Commonly known as:  IMDUR  Take 1 tablet (30 mg total) by mouth daily.     loratadine 10 MG tablet  Commonly known as:  CLARITIN  Take 10 mg by mouth daily.     pantoprazole 40 MG tablet  Commonly known as:  PROTONIX  Take 40 mg by mouth daily.     traMADol 50 MG tablet  Commonly known as:  ULTRAM  Take 50 mg by mouth every 6 (six) hours as needed. For pain       No Known Allergies    The results of significant diagnostics from this hospitalization (including imaging, microbiology, ancillary and laboratory) are listed below for reference.    Significant Diagnostic Studies: Dg Chest 2 View  05/27/2012   *RADIOLOGY REPORT*  Clinical Data: Altered mental status, history of diabetes, hypertension  CHEST - 2 VIEW  Comparison: 02/18/2012  Findings: Left subclavian AICD / pacer noted.  Mild cardiomegaly without CHF.  Low lung volumes persist with residual atelectasis. No new consolidation, collapse, edema, effusion, or pneumothorax. Trachea is midline.  IMPRESSION: Low volume exam with atelectasis.   Original Report Authenticated By: Judie Petit. Miles Costain, M.D.   Ct Head Wo Contrast  05/27/2012   *RADIOLOGY REPORT*  Clinical Data: Altered mental status, increased confusion  CT HEAD WITHOUT CONTRAST  Technique:  Contiguous axial images were obtained from the base of the skull through the vertex without  contrast.  Comparison: 02/18/2012  Findings: Stable brain atrophy and ventricular enlargement. Moderate to severe periventricular white matter hypoattenuation worse on the left compatible with chronic microvascular ischemic changes.  Remote right basal ganglia lacunar type infarcts.  No acute intracranial hemorrhage, definite infarction, mass lesion, midline shift, herniation, or extra-axial fluid collection. Cisterns patent.  No cerebellar abnormality.  Mastoids and sinuses clear.  Overall stable exam.  IMPRESSION: Stable atrophy and chronic microvascular ischemic changes in the white  matter.  No interval change or acute process by noncontrast CT.   Original Report Authenticated By: Judie Petit. Miles Costain, M.D.    Microbiology: Recent Results (from the past 240 hour(s))  CULTURE, BLOOD (ROUTINE X 2)     Status: None   Collection Time    05/27/12  3:38 PM      Result Value Range Status   Specimen Description BLOOD RIGHT HAND   Final   Special Requests BOTTLES DRAWN AEROBIC ONLY   Final   Culture  Setup Time 05/27/2012 20:35   Final   Culture     Final   Value:        BLOOD CULTURE RECEIVED NO GROWTH TO DATE CULTURE WILL BE HELD FOR 5 DAYS BEFORE ISSUING A FINAL NEGATIVE REPORT   Report Status PENDING   Incomplete  CULTURE, BLOOD (ROUTINE X 2)     Status: None   Collection Time    05/27/12  3:42 PM      Result Value Range Status   Specimen Description BLOOD RIGHT ARM   Final   Special Requests BOTTLES DRAWN AEROBIC ONLY   Final   Culture  Setup Time 05/27/2012 20:36   Final   Culture     Final   Value:        BLOOD CULTURE RECEIVED NO GROWTH TO DATE CULTURE WILL BE HELD FOR 5 DAYS BEFORE ISSUING A FINAL NEGATIVE REPORT   Report Status PENDING   Incomplete  URINE CULTURE     Status: None   Collection Time    05/27/12  4:12 PM      Result Value Range Status   Specimen Description URINE, CATHETERIZED   Final   Special Requests NONE   Final   Culture  Setup Time 05/27/2012 16:23   Final   Colony  Count NO GROWTH   Final   Culture NO GROWTH   Final   Report Status 05/28/2012 FINAL   Final     Labs: Basic Metabolic Panel:  Recent Labs Lab 05/27/12 1516 05/27/12 1806 05/27/12 2313 05/28/12 0408 05/28/12 0620 05/29/12 0937 05/30/12 0420  NA 126* 128*  --   --  135 131* 133*  K 6.6* 6.1* 3.3* 3.1* 3.2* 3.2* 3.9  CL 89* 91*  --   --  98 97 103  CO2 21 17*  --   --  25 20 23   GLUCOSE 147* 131*  --   --  195* 155* 130*  BUN 67* 68*  --   --  54* 32* 24*  CREATININE 2.19* 2.09*  --   --  1.71* 1.43* 1.38*  CALCIUM 12.3* 12.4*  --   --  10.7* 9.8 9.1  MG  --   --   --   --  2.2 1.9 2.0  PHOS  --   --   --   --  1.9* 2.9 2.3   Liver Function Tests:  Recent Labs Lab 05/27/12 1516 05/27/12 1806  AST 51* 54*  ALT 30 27  ALKPHOS 21* 22*  BILITOT 0.4 0.4  PROT 8.3 8.4*  ALBUMIN 4.2 3.9   No results found for this basename: LIPASE, AMYLASE,  in the last 168 hours No results found for this basename: AMMONIA,  in the last 168 hours CBC:  Recent Labs Lab 05/27/12 1516 05/28/12 0620  WBC 5.3 5.4  NEUTROABS 3.3  --   HGB 14.4 12.2  HCT 42.3 37.1  MCV 85.8 87.1  PLT PLATELET CLUMPS NOTED ON  SMEAR, UNABLE TO ESTIMATE 221   Cardiac Enzymes: No results found for this basename: CKTOTAL, CKMB, CKMBINDEX, TROPONINI,  in the last 168 hours BNP: BNP (last 3 results) No results found for this basename: PROBNP,  in the last 8760 hours CBG:  Recent Labs Lab 05/30/12 0735 05/30/12 1133 05/30/12 1627 05/30/12 2115 05/31/12 0800  GLUCAP 88 107* 182* 204* 176*       Signed:  Nikkolas Coomes  [facility] 05/31/2012, 12:37 PM

## 2012-06-01 LAB — GLUCOSE, CAPILLARY

## 2012-06-01 MED ORDER — POTASSIUM CHLORIDE CRYS ER 20 MEQ PO TBCR
40.0000 meq | EXTENDED_RELEASE_TABLET | Freq: Once | ORAL | Status: AC
  Administered 2012-06-01: 40 meq via ORAL
  Filled 2012-06-01: qty 2

## 2012-06-01 NOTE — Progress Notes (Signed)
   CARE MANAGEMENT NOTE 06/01/2012  Patient:  Paula [name], Paula [name]   Account Number:  [id number]  Date Initiated:  05/28/2012  Documentation initiated by:  Darlyne Russian  Subjective/Objective Assessment:   Patient admitted from home with decreased appetite, K6.6, AKI, dementia.  Lives at home with caretaker.     Action/Plan:   Progression of care and discharge planning   Anticipated DC Date:  06/01/2012   Anticipated DC Plan:  HOME/SELF CARE      DC Planning Services  CM consult      Choice offered to / List presented to:             Status of service:  Completed, signed off Medicare Important Message given?   (If response is "NO", the following Medicare IM given date fields will be blank) Date Medicare IM given:   Date Additional Medicare IM given:    Discharge Disposition:  HOME/SELF CARE  Per UR Regulation:  Reviewed for med. necessity/level of care/duration of stay  If discussed at Long Length of Stay Meetings, dates discussed:    Comments:  06/01/2012 1300 NCM spoke to dtr and states she lives with dtr, Paula [name]. Pt has CAP and has aide that comes six hours M-F. She has life alert. Pt states she is getting more forgetful each day. But states she does not leave the house or cook without the assistance of dtr. NCM encouraged her to continue to read newspaper and books to help keep up with current events. Isidoro Donning RN CCM Case Mgmt phone [phone]

## 2012-06-01 NOTE — Discharge Summary (Addendum)
Physician Discharge Summary  Paula [name] ZHY:[id number] DOB: [birth year] DOA: 05/27/2012  PCP: No primary provider on file.  Admit date: 05/27/2012 Discharge date: 06/01/2012  Time spent: 35 minutes  Recommendations for Outpatient Follow-up:  1. Follow up with BMP on Monday.  2. Follow up with PCP in one week.   Discharge Diagnoses:  Principal Problem:   Hyperkalemia, diminished renal excretion Active Problems:   AKI (acute kidney injury)   Dehydration   DM2 (diabetes mellitus, type 2)   HTN (hypertension)   Dementia   Adult failure to thrive  Severe malnutrition.  Discharge Condition: improved.   Diet recommendation: low sodium diet  Filed Weights   05/29/12 2115 05/30/12 2112 05/31/12 2125  Weight: 80.74 kg (178 lb) 80.74 kg (178 lb) 80.74 kg (178 lb)    History of present illness:  Paula [name] is a 72 y.o. female with baseline dementia who lives at home with family and caretaker. Caretaker brought patient to ER today because patient has a 1 month history of gradually worsening decreased appetite and failure to thrive. PO intake has decreased to the point where patient will not eat or drink anything so they brought her to the ED because they felt she likely needed IV fluids. In the ED the patient's lab workup was consistent with fairly profound dehydration, AKI and hyperkalemia of 6.1. UA was negative, IV access was very difficult due to her dehydration and as a result a central IVC had to be placed. Hospitalist has been asked to admit. She was started on IV fluids and her hyperkalemia and dehydration resolved. Her renal insufficiency improved. Her diuretics and ace inhibitor was stopped and she is recommended to check bMP on monday and follow up with PCP and CARDIOLOGY in one week.    Hospital Course:   1. Hyperkalemia: resolved. She was on lisinopril, which was stopped. It can resumed as per Dr Allyson Sabal her cardiologist.  2. Dehydration; RESOLVED with IV fluids.   3. Diabetes Mellitus: hgba1c is  6.9. She will be discharged on 5 units of lantus. Decreased from her home dose of 16 units of lantus.  CBG (last 3)   Recent Labs   05/30/12 0735  05/30/12 1133  05/30/12 1627   GLUCAP  88  107*  182*     4. Hypertension;held lisinopril and diuretics for renal insufficiency, initially. Her BP is  Better controlled. Her lasix and imdur are resumed . Her lisinopril held for hyperkalemia.  5. Acute on CKD stage 3 : probably pre renal in origin, in view of the hypotension on admission. Gentle hydration and repeat levels have improved to 1.38.  6. Hypokalemia. Hypophosphatemia: replete as needed.  7. Chronic systolic heart failure; she follows with [facility], she appears to be compensated.  7. Slightly elevated troponin: possibly from acute renal failure and demand ischemia from hypotension. She is asymptomatic, denies any chest pain . REPEAT EKG is unchanged from before. Recommended to follow up with cardiologist  in one week.   Procedures:  NONE  Consultations:  NONE  Discharge Exam: Filed Vitals:   05/31/12 2125 06/01/12 0439 06/01/12 0826 06/01/12 1400  BP: 143/68 143/72 126/77 132/74  Pulse: 61 64 68 72  Temp: 99 F (37.2 C) 98.6 F (37 C) 97.2 F (36.2 C) 98.2 F (36.8 C)  TempSrc: Oral Oral Oral Oral  Resp: 20 20 20 20   Height: 5\' 5"  (1.651 m)     Weight: 80.74 kg (178 lb)     SpO2: 100%  100% 100% 100%    Cardiovascular: RRR w/o MRG  Respiratory: CTA B  Abdomen: soft, nt, nd, bs+  Musculoskeletal: MAE, full ROM all 4 extremities  Neurologic: demented, non-focal, cooperative at times, follows simple commands, oriented to person only but this is baseline for patient.   Discharge Instructions      Discharge Orders   Future Orders Complete By Expires     Diet - low sodium heart healthy  As directed     Discharge instructions  As directed     Comments:      Follow up with PCP in one week Please check BMP in one week    Discharge  instructions  As directed     Comments:      Follow up with cardiology and Primary doctor in 1 week.        Medication List    STOP taking these medications       chlorthalidone 25 MG tablet  Commonly known as:  HYGROTON     LANTUS SOLOSTAR 100 UNIT/ML Sopn  Generic drug:  Insulin Glargine     lisinopril 40 MG tablet  Commonly known as:  PRINIVIL,ZESTRIL      TAKE these medications       alendronate 70 MG tablet  Commonly known as:  FOSAMAX  Take 1 tablet (70 mg total) by mouth every 7 (seven) days. Take with a full glass of water on an empty stomach. On Wednesdays     aspirin EC 325 MG tablet  Take 1 tablet (325 mg total) by mouth daily.     ezetimibe 10 MG tablet  Commonly known as:  ZETIA  Take 10 mg by mouth daily.     fenofibrate 160 MG tablet  Take 160 mg by mouth daily.     Fish Oil 1200 MG Caps  Take 1 capsule by mouth daily.     furosemide 20 MG tablet  Commonly known as:  LASIX  Take 20 mg by mouth daily.     insulin glargine 100 UNIT/ML injection  Commonly known as:  LANTUS  Inject 0.05 mLs (5 Units total) into the skin at bedtime.     isosorbide mononitrate 30 MG 24 hr tablet  Commonly known as:  IMDUR  Take 1 tablet (30 mg total) by mouth daily.     loratadine 10 MG tablet  Commonly known as:  CLARITIN  Take 10 mg by mouth daily.     NIACIN PO  Take 1 tablet by mouth daily.     NOVOLOG PENFILL 100 UNIT/ML Soln cartridge  Generic drug:  insulin aspart  Inject 3-5 Units into the skin 3 (three) times daily with meals.     pantoprazole 40 MG tablet  Commonly known as:  PROTONIX  Take 40 mg by mouth daily.     potassium chloride SA 20 MEQ tablet  Commonly known as:  K-DUR,KLOR-CON  Take 20 mEq by mouth daily.     traMADol 50 MG tablet  Commonly known as:  ULTRAM  Take 50 mg by mouth every 6 (six) hours as needed. For pain       No Known Allergies    The results of significant diagnostics from this hospitalization (including  imaging, microbiology, ancillary and laboratory) are listed below for reference.    Significant Diagnostic Studies: Dg Chest 2 View  05/27/2012   *RADIOLOGY REPORT*  Clinical Data: Altered mental status, history of diabetes, hypertension  CHEST - 2 VIEW  Comparison: 02/18/2012  Findings:  Left subclavian AICD / pacer noted.  Mild cardiomegaly without CHF.  Low lung volumes persist with residual atelectasis. No new consolidation, collapse, edema, effusion, or pneumothorax. Trachea is midline.  IMPRESSION: Low volume exam with atelectasis.   Original Report Authenticated By: Judie Petit. Miles Costain, M.D.   Ct Head Wo Contrast  05/27/2012   *RADIOLOGY REPORT*  Clinical Data: Altered mental status, increased confusion  CT HEAD WITHOUT CONTRAST  Technique:  Contiguous axial images were obtained from the base of the skull through the vertex without contrast.  Comparison: 02/18/2012  Findings: Stable brain atrophy and ventricular enlargement. Moderate to severe periventricular white matter hypoattenuation worse on the left compatible with chronic microvascular ischemic changes.  Remote right basal ganglia lacunar type infarcts.  No acute intracranial hemorrhage, definite infarction, mass lesion, midline shift, herniation, or extra-axial fluid collection. Cisterns patent.  No cerebellar abnormality.  Mastoids and sinuses clear.  Overall stable exam.  IMPRESSION: Stable atrophy and chronic microvascular ischemic changes in the white matter.  No interval change or acute process by noncontrast CT.   Original Report Authenticated By: Judie Petit. Miles Costain, M.D.    Microbiology: Recent Results (from the past 240 hour(s))  CULTURE, BLOOD (ROUTINE X 2)     Status: None   Collection Time    05/27/12  3:38 PM      Result Value Range Status   Specimen Description BLOOD RIGHT HAND   Final   Special Requests BOTTLES DRAWN AEROBIC ONLY   Final   Culture  Setup Time 05/27/2012 20:35   Final   Culture     Final   Value:        BLOOD CULTURE  RECEIVED NO GROWTH TO DATE CULTURE WILL BE HELD FOR 5 DAYS BEFORE ISSUING A FINAL NEGATIVE REPORT   Report Status PENDING   Incomplete  CULTURE, BLOOD (ROUTINE X 2)     Status: None   Collection Time    05/27/12  3:42 PM      Result Value Range Status   Specimen Description BLOOD RIGHT ARM   Final   Special Requests BOTTLES DRAWN AEROBIC ONLY   Final   Culture  Setup Time 05/27/2012 20:36   Final   Culture     Final   Value:        BLOOD CULTURE RECEIVED NO GROWTH TO DATE CULTURE WILL BE HELD FOR 5 DAYS BEFORE ISSUING A FINAL NEGATIVE REPORT   Report Status PENDING   Incomplete  URINE CULTURE     Status: None   Collection Time    05/27/12  4:12 PM      Result Value Range Status   Specimen Description URINE, CATHETERIZED   Final   Special Requests NONE   Final   Culture  Setup Time 05/27/2012 16:23   Final   Colony Count NO GROWTH   Final   Culture NO GROWTH   Final   Report Status 05/28/2012 FINAL   Final     Labs: Basic Metabolic Panel:  Recent Labs Lab 05/27/12 1806  05/28/12 0408 05/28/12 0620 05/29/12 0937 05/30/12 0420 05/31/12 1410  NA 128*  --   --  135 131* 133* 131*  K 6.1*  < > 3.1* 3.2* 3.2* 3.9 3.4*  CL 91*  --   --  98 97 103 101  CO2 17*  --   --  25 20 23 19   GLUCOSE 131*  --   --  195* 155* 130* 175*  BUN 68*  --   --  54* 32* 24* 13  CREATININE 2.09*  --   --  1.71* 1.43* 1.38* 1.35*  CALCIUM 12.4*  --   --  10.7* 9.8 9.1 9.6  MG  --   --   --  2.2 1.9 2.0  --   PHOS  --   --   --  1.9* 2.9 2.3  --   < > = values in this interval not displayed. Liver Function Tests:  Recent Labs Lab 05/27/12 1516 05/27/12 1806  AST 51* 54*  ALT 30 27  ALKPHOS 21* 22*  BILITOT 0.4 0.4  PROT 8.3 8.4*  ALBUMIN 4.2 3.9   No results found for this basename: LIPASE, AMYLASE,  in the last 168 hours No results found for this basename: AMMONIA,  in the last 168 hours CBC:  Recent Labs Lab 05/27/12 1516 05/28/12 0620  WBC 5.3 5.4  NEUTROABS 3.3  --   HGB  14.4 12.2  HCT 42.3 37.1  MCV 85.8 87.1  PLT PLATELET CLUMPS NOTED ON SMEAR, UNABLE TO ESTIMATE 221   Cardiac Enzymes: No results found for this basename: CKTOTAL, CKMB, CKMBINDEX, TROPONINI,  in the last 168 hours BNP: BNP (last 3 results) No results found for this basename: PROBNP,  in the last 8760 hours CBG:  Recent Labs Lab 05/31/12 1723 05/31/12 2128 06/01/12 0731 06/01/12 0929 06/01/12 1206  GLUCAP 198* 145* 141* 171* 115*       Signed:  Dedria Endres  [facility] 06/01/2012, 2:52 PM

## 2012-06-02 LAB — CULTURE, BLOOD (ROUTINE X 2): Culture: NO GROWTH

## 2012-06-09 IMAGING — CR DG CHEST 1V PORT
1 series · 1 of 1 positions shown · non-contrast
Comparison: CT and plain films chest 05/23/2010.

CLINICAL DATA: Status post PICC placement.

PORTABLE CHEST - 1 VIEW

[view not recorded]
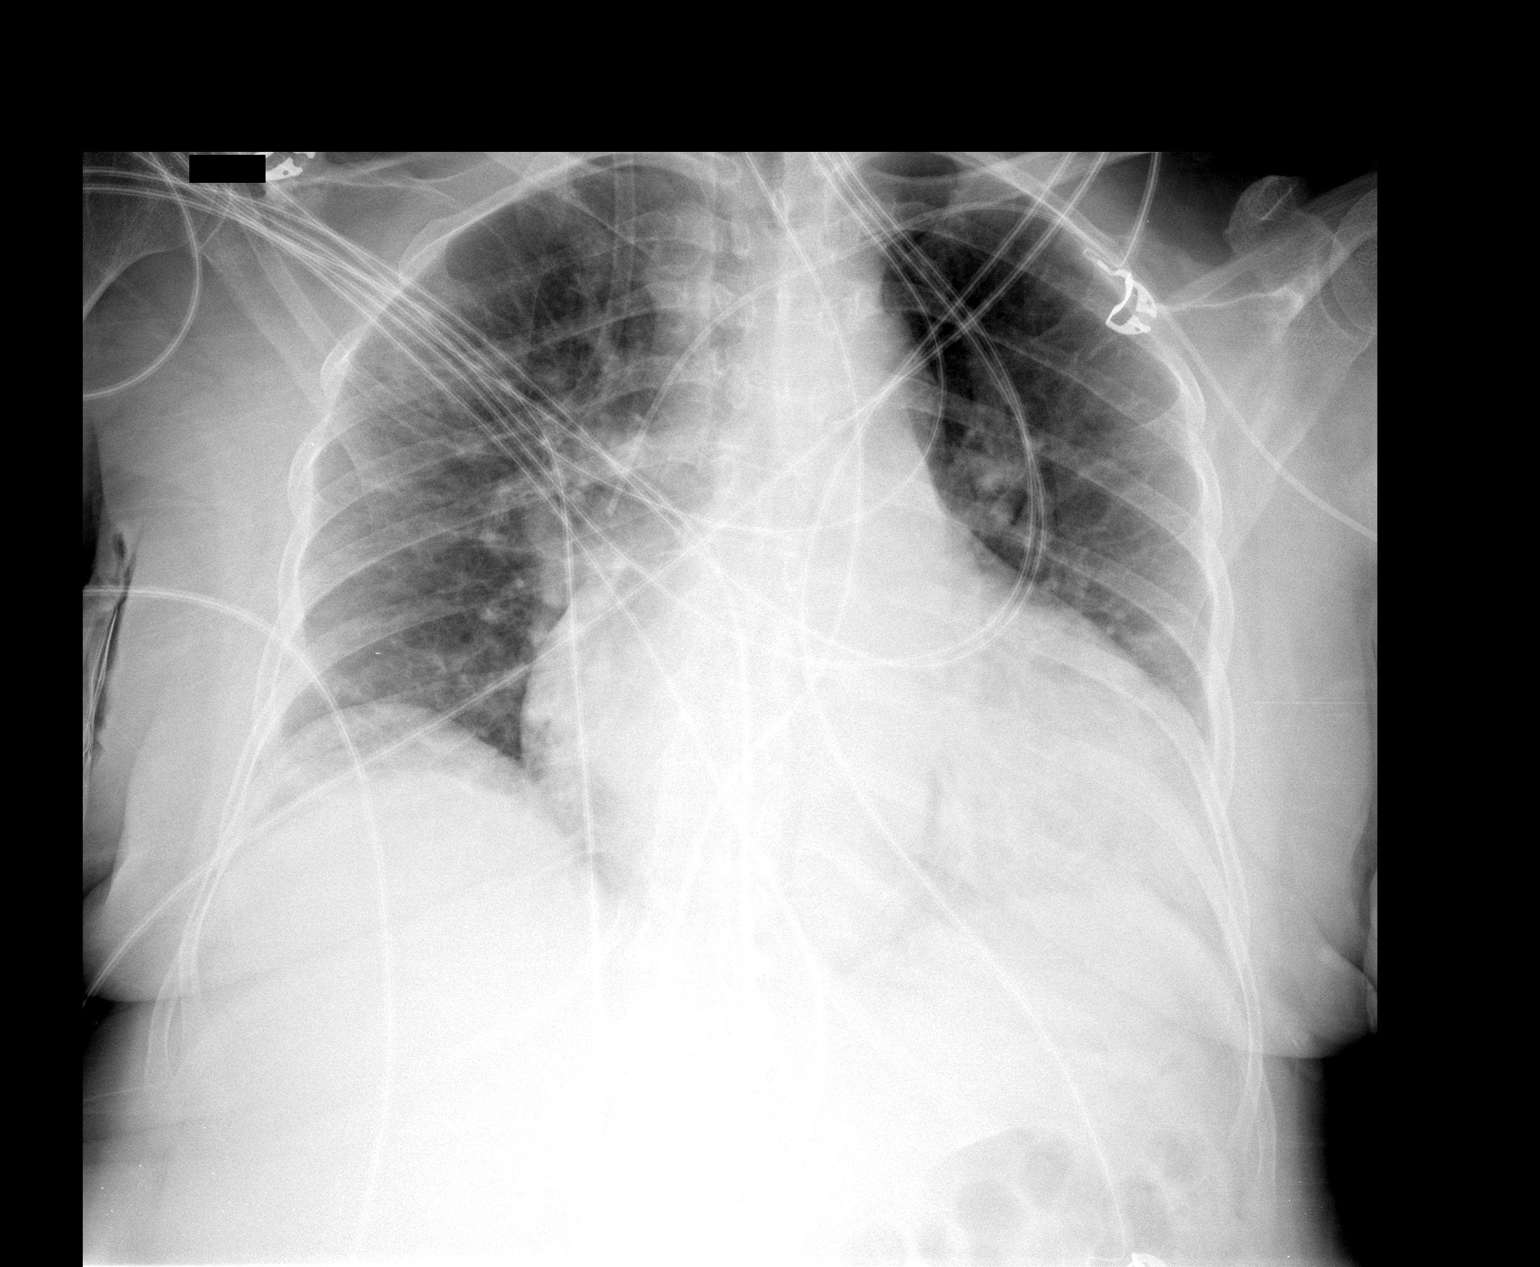

[1 of 1 positions shown; findings below may reference images not displayed]

FINDINGS: The patient has a new left PICC with the tip in the lower
superior vena cava.  Mild interstitial edema persists.  Marked
cardiomegaly again noted.
IMPRESSION: 1.  Left PICC in good position.
2.  Cardiomegaly and mild interstitial edema.

## 2012-07-10 ENCOUNTER — Other Ambulatory Visit: Payer: Self-pay | Admitting: Geriatric Medicine

## 2012-07-10 ENCOUNTER — Other Ambulatory Visit: Payer: Self-pay | Admitting: Internal Medicine

## 2012-07-10 MED ORDER — TRAMADOL HCL 50 MG PO TABS: 50.0000 mg | ORAL_TABLET | Freq: Four times a day (QID) | ORAL | Status: DC | PRN

## 2012-07-11 ENCOUNTER — Emergency Department (HOSPITAL_COMMUNITY): Payer: Medicare Other

## 2012-07-11 ENCOUNTER — Encounter (HOSPITAL_COMMUNITY): Payer: Self-pay | Admitting: Emergency Medicine

## 2012-07-11 ENCOUNTER — Inpatient Hospital Stay (HOSPITAL_COMMUNITY)
Admission: EM | Admit: 2012-07-11 | Discharge: 2012-07-15 | DRG: 683 | Disposition: A | Discharge status: Home / Self Care | Payer: Medicare Other | Attending: Internal Medicine | Admitting: Internal Medicine

## 2012-07-11 DIAGNOSIS — I10 Essential (primary) hypertension: Secondary | ICD-10-CM | POA: Diagnosis present

## 2012-07-11 DIAGNOSIS — N179 Acute kidney failure, unspecified: Secondary | ICD-10-CM | POA: Diagnosis present

## 2012-07-11 DIAGNOSIS — E119 Type 2 diabetes mellitus without complications: Secondary | ICD-10-CM

## 2012-07-11 DIAGNOSIS — R630 Anorexia: Secondary | ICD-10-CM | POA: Diagnosis present

## 2012-07-11 DIAGNOSIS — E440 Moderate protein-calorie malnutrition: Secondary | ICD-10-CM | POA: Diagnosis present

## 2012-07-11 DIAGNOSIS — E871 Hypo-osmolality and hyponatremia: Secondary | ICD-10-CM | POA: Diagnosis present

## 2012-07-11 DIAGNOSIS — R627 Adult failure to thrive: Secondary | ICD-10-CM | POA: Diagnosis present

## 2012-07-11 DIAGNOSIS — N181 Chronic kidney disease, stage 1: Secondary | ICD-10-CM | POA: Diagnosis present

## 2012-07-11 DIAGNOSIS — E1169 Type 2 diabetes mellitus with other specified complication: Secondary | ICD-10-CM | POA: Diagnosis present

## 2012-07-11 DIAGNOSIS — E860 Dehydration: Secondary | ICD-10-CM

## 2012-07-11 DIAGNOSIS — F0390 Unspecified dementia without behavioral disturbance: Secondary | ICD-10-CM | POA: Diagnosis present

## 2012-07-11 DIAGNOSIS — Z794 Long term (current) use of insulin: Secondary | ICD-10-CM

## 2012-07-11 DIAGNOSIS — I129 Hypertensive chronic kidney disease with stage 1 through stage 4 chronic kidney disease, or unspecified chronic kidney disease: Secondary | ICD-10-CM | POA: Diagnosis present

## 2012-07-11 DIAGNOSIS — Z8673 Personal history of transient ischemic attack (TIA), and cerebral infarction without residual deficits: Secondary | ICD-10-CM

## 2012-07-11 LAB — CBC WITH DIFFERENTIAL/PLATELET
Eosinophils Relative: 0 % (ref 0–5)
HCT: 36.8 % (ref 36.0–46.0)
Lymphocytes Relative: 23 % (ref 12–46)
Lymphs Abs: 1.2 10*3/uL (ref 0.7–4.0)
MCV: 82.1 fL (ref 78.0–100.0)
Monocytes Absolute: 0.3 10*3/uL (ref 0.1–1.0)
RBC: 4.48 MIL/uL (ref 3.87–5.11)
RDW: 14.9 % (ref 11.5–15.5)
WBC: 5.2 10*3/uL (ref 4.0–10.5)

## 2012-07-11 LAB — COMPREHENSIVE METABOLIC PANEL
BUN: 65 mg/dL — ABNORMAL HIGH (ref 6–23)
CO2: 21 mEq/L (ref 19–32)
Calcium: 11 mg/dL — ABNORMAL HIGH (ref 8.4–10.5)
Creatinine, Ser: 2.95 mg/dL — ABNORMAL HIGH (ref 0.50–1.10)
GFR calc Af Amer: 17 mL/min — ABNORMAL LOW (ref >90)
GFR calc non Af Amer: 15 mL/min — ABNORMAL LOW (ref >90)
Glucose, Bld: 149 mg/dL — ABNORMAL HIGH (ref 70–99)

## 2012-07-11 LAB — URINALYSIS, ROUTINE W REFLEX MICROSCOPIC
Nitrite: NEGATIVE
Specific Gravity, Urine: 1.01 (ref 1.005–1.030)
Urobilinogen, UA: 0.2 mg/dL (ref 0.0–1.0)

## 2012-07-11 LAB — GLUCOSE, CAPILLARY: Glucose-Capillary: 72 mg/dL (ref 70–99)

## 2012-07-11 LAB — AMMONIA: Ammonia: 12 umol/L (ref 11–60)

## 2012-07-11 MED ORDER — SODIUM CHLORIDE 0.9 % IJ SOLN: 3.0000 mL | Freq: Two times a day (BID) | INTRAMUSCULAR | Status: DC

## 2012-07-11 MED ORDER — SODIUM CHLORIDE 0.9 % IV BOLUS (SEPSIS)
1000.0000 mL | Freq: Once | INTRAVENOUS | Status: AC
  Administered 2012-07-11: 1000 mL via INTRAVENOUS

## 2012-07-11 MED ORDER — SODIUM CHLORIDE 0.9 % IV SOLN
INTRAVENOUS | Status: DC
  Administered 2012-07-11 – 2012-07-13 (×3): via INTRAVENOUS
  Administered 2012-07-14: 1000 mL via INTRAVENOUS
  Administered 2012-07-14 – 2012-07-15 (×2): via INTRAVENOUS

## 2012-07-11 MED ORDER — ASPIRIN EC 325 MG PO TBEC
325.0000 mg | DELAYED_RELEASE_TABLET | Freq: Every day | ORAL | Status: DC
  Administered 2012-07-12 – 2012-07-15 (×4): 325 mg via ORAL
  Filled 2012-07-11 (×4): qty 1

## 2012-07-11 MED ORDER — OXYCODONE HCL 5 MG PO TABS: 5.0000 mg | ORAL_TABLET | ORAL | Status: DC | PRN

## 2012-07-11 MED ORDER — OMEGA-3-ACID ETHYL ESTERS 1 G PO CAPS
1.0000 g | ORAL_CAPSULE | Freq: Two times a day (BID) | ORAL | Status: DC
  Administered 2012-07-11 – 2012-07-15 (×8): 1 g via ORAL
  Filled 2012-07-11 (×9): qty 1

## 2012-07-11 MED ORDER — MORPHINE SULFATE 2 MG/ML IJ SOLN: 1.0000 mg | INTRAMUSCULAR | Status: DC | PRN

## 2012-07-11 MED ORDER — HEPARIN SODIUM (PORCINE) 5000 UNIT/ML IJ SOLN
5000.0000 [IU] | Freq: Three times a day (TID) | INTRAMUSCULAR | Status: DC
  Administered 2012-07-11 – 2012-07-15 (×12): 5000 [IU] via SUBCUTANEOUS
  Filled 2012-07-11 (×14): qty 1

## 2012-07-11 MED ORDER — EZETIMIBE 10 MG PO TABS
10.0000 mg | ORAL_TABLET | Freq: Every day | ORAL | Status: DC
  Administered 2012-07-12 – 2012-07-15 (×4): 10 mg via ORAL
  Filled 2012-07-11 (×5): qty 1

## 2012-07-11 MED ORDER — PNEUMOCOCCAL VAC POLYVALENT 25 MCG/0.5ML IJ INJ
0.5000 mL | INJECTION | INTRAMUSCULAR | Status: AC
  Administered 2012-07-13: 0.5 mL via INTRAMUSCULAR
  Filled 2012-07-11: qty 0.5

## 2012-07-11 MED ORDER — ACETAMINOPHEN 650 MG RE SUPP
650.0000 mg | RECTAL | Status: DC | PRN
  Administered 2012-07-11: 650 mg via RECTAL
  Filled 2012-07-11: qty 1

## 2012-07-11 MED ORDER — ACETAMINOPHEN 325 MG PO TABS: 650.0000 mg | ORAL_TABLET | Freq: Four times a day (QID) | ORAL | Status: DC | PRN

## 2012-07-11 MED ORDER — INSULIN GLARGINE 100 UNIT/ML ~~LOC~~ SOLN
5.0000 [IU] | Freq: Every day | SUBCUTANEOUS | Status: DC
  Administered 2012-07-11: 5 [IU] via SUBCUTANEOUS
  Filled 2012-07-11 (×3): qty 0.05

## 2012-07-11 MED ORDER — PANTOPRAZOLE SODIUM 40 MG PO TBEC
40.0000 mg | DELAYED_RELEASE_TABLET | Freq: Every day | ORAL | Status: DC
  Administered 2012-07-12 – 2012-07-15 (×4): 40 mg via ORAL
  Filled 2012-07-11 (×5): qty 1

## 2012-07-11 MED ORDER — SODIUM CHLORIDE 0.9 % IV SOLN: INTRAVENOUS | Status: DC

## 2012-07-11 MED ORDER — FENOFIBRATE 160 MG PO TABS
160.0000 mg | ORAL_TABLET | Freq: Every day | ORAL | Status: DC
  Administered 2012-07-12 – 2012-07-15 (×4): 160 mg via ORAL
  Filled 2012-07-11 (×5): qty 1

## 2012-07-11 MED ORDER — INSULIN ASPART 100 UNIT/ML ~~LOC~~ SOLN: 0.0000 [IU] | Freq: Three times a day (TID) | SUBCUTANEOUS | Status: DC

## 2012-07-11 MED ORDER — ISOSORBIDE MONONITRATE ER 30 MG PO TB24
30.0000 mg | ORAL_TABLET | Freq: Every day | ORAL | Status: DC
  Filled 2012-07-11 (×2): qty 1

## 2012-07-11 MED ORDER — ACETAMINOPHEN 650 MG RE SUPP: 650.0000 mg | Freq: Four times a day (QID) | RECTAL | Status: DC | PRN

## 2012-07-11 MED ORDER — INSULIN ASPART 100 UNIT/ML ~~LOC~~ SOLN: 0.0000 [IU] | Freq: Every day | SUBCUTANEOUS | Status: DC

## 2012-07-11 NOTE — ED Notes (Signed)
Pt brought to ED by EMS with LOC and hypotensive(82/49).

## 2012-07-11 NOTE — ED Notes (Signed)
Pt with difficult IV acess.

## 2012-07-11 NOTE — H&P (Signed)
[facility] History and Physical  DAVIS VANNATTER NWG:[id number] DOB: November 27, 1940 DOA: 07/11/2012  Referring physician: Emergency Department PCP: No primary provider on file.  Specialists:   Chief Complaint: Paula [name]  HPI: Paula [name] is a 72 y.o. female with a hx of diabetes, htn, and dementia who presents to the ED after being found poorly responsive. The patient was noted to have decreased appetite and decreased PO intake several weeks leading up to the presenting event. Prior to the arrival EMS, the pt's caregiver initiated CPR on the patient, resulting in the patient rapidly awakening. She was then transported to the ED where a Cr was noted to be just under 3 ( baseline of around 1.3). A serum sodium was noted to be 123. The patient was found to be initially hypotensive and aggressive fluids were initiated. The hospitalist service was consulted for possible admission.  Review of Systems: Complains of pain around the IV site, remainder of 10 point review of systems reviewed and are negative  Past Medical History  Diagnosis Date  . Angina   . Shortness of breath   . DEMENTIA   . Hypertension   . Stroke   . Headache(784.0)   . Diabetes mellitus   . Dementia 05/27/2012   Past Surgical History  Procedure Laterality Date  . Insert / replace / remove pacemaker  12/15/2010  . Back surgery    . Abdominal hysterectomy     Social History:  reports that she has been passively smoking Cigarettes.  She has been smoking about 0.00 packs per day. She has never used smokeless tobacco. She reports that she does not drink alcohol or use illicit drugs.    No Known Allergies  No family history on file.  (be sure to complete)  Prior to Admission medications   Medication Sig Start Date End Date Taking? Authorizing Provider  alendronate (FOSAMAX) 70 MG tablet Take 1 tablet (70 mg total) by mouth every 7 (seven) days. Take with a full glass of water on an empty stomach. On Wednesdays  03/28/12   Sharee Holster, NP  aspirin EC 325 MG tablet Take 1 tablet (325 mg total) by mouth daily. 05/31/12   Kathlen Mody, MD  B-D ULTRAFINE III SHORT PEN 31G X 8 MM MISC USE 1 PEN NEEDLE 2 TIMES A DAY AS NEEDED 07/10/12   Claudie Revering, NP  ezetimibe (ZETIA) 10 MG tablet Take 10 mg by mouth daily.    Historical Provider, MD  fenofibrate 160 MG tablet Take 160 mg by mouth daily.    Historical Provider, MD  furosemide (LASIX) 20 MG tablet Take 20 mg by mouth daily.    Historical Provider, MD  insulin aspart (NOVOLOG PENFILL) 100 UNIT/ML SOLN cartridge Inject 3-5 Units into the skin 3 (three) times daily with meals.    Historical Provider, MD  insulin glargine (LANTUS) 100 UNIT/ML injection Inject 0.05 mLs (5 Units total) into the skin at bedtime. 05/31/12   Kathlen Mody, MD  isosorbide mononitrate (IMDUR) 30 MG 24 hr tablet Take 1 tablet (30 mg total) by mouth daily. 05/31/12   Kathlen Mody, MD  loratadine (CLARITIN) 10 MG tablet Take 10 mg by mouth daily.      Historical Provider, MD  NIACIN PO Take 1 tablet by mouth daily.    Historical Provider, MD  Omega-3 Fatty Acids (FISH OIL) 1200 MG CAPS Take 1 capsule by mouth daily.    Historical Provider, MD  pantoprazole (PROTONIX) 40 MG tablet Take 40 mg  by mouth daily.    Historical Provider, MD  potassium chloride SA ([name]-DUR,KLOR-CON) 20 MEQ tablet Take 20 mEq by mouth daily.    Historical Provider, MD  traMADol (ULTRAM) 50 MG tablet Take 1 tablet (50 mg total) by mouth every 6 (six) hours as needed. For pain 07/10/12   Kermit Balo, DO   Physical Exam: Filed Vitals:   07/11/12 1645 07/11/12 1645 07/11/12 1715 07/11/12 1730  BP: 126/55  109/67 103/85  Temp:  96.7 F (35.9 C)    TempSrc:  Rectal    Resp: 18  17 18   SpO2:         General:  Awake, in nad  Eyes: PERRL B  ENT: membranes dry, dentition fair  Neck: trachea midline, neck supple  Cardiovascular: regular, s1, s2  Respiratory: normal resp effort, no wheezing  Abdomen:  soft, nondistended  Skin: no abnormal skin lesions seen  Musculoskeletal:  Perfused, no clubbing or cyanosis  Psychiatric: appears normal  Neurologic: cn2-12 grossly intact, strength and sensation intact  Labs on Admission:  Basic Metabolic Panel:  Recent Labs Lab 07/11/12 1526  NA 123*  [name] 4.0  CL 86*  CO2 21  GLUCOSE 149*  BUN 65*  CREATININE 2.95*  CALCIUM 11.0*   Liver Function Tests:  Recent Labs Lab 07/11/12 1526  AST 42*  ALT 41*  ALKPHOS 24*  BILITOT 0.6  PROT 7.8  ALBUMIN 4.0   No results found for this basename: LIPASE, AMYLASE,  in the last 168 hours  Recent Labs Lab 07/11/12 1538  AMMONIA 12   CBC:  Recent Labs Lab 07/11/12 1526  WBC 5.2  NEUTROABS 3.7  HGB 12.7  HCT 36.8  MCV 82.1  PLT 198   Cardiac Enzymes: No results found for this basename: CKTOTAL, CKMB, CKMBINDEX, TROPONINI,  in the last 168 hours  BNP (last 3 results)  Recent Labs  07/11/12 1526  PROBNP 299.3*   CBG: No results found for this basename: GLUCAP,  in the last 168 hours  Radiological Exams on Admission: Ct Head Wo Contrast  07/11/2012   *RADIOLOGY REPORT*  Clinical Data: Loss of consciousness.  Dementia  CT HEAD WITHOUT CONTRAST  Technique:  Contiguous axial images were obtained from the base of the skull through the vertex without contrast.  Comparison: 05/27/2012  Findings: Generalized atrophy is unchanged.  Chronic microvascular ischemia in the white matter, left greater than right is unchanged. 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  Negative for acute infarct.  Negative for hemorrhage or mass.  No acute skull abnormality.  IMPRESSION: Atrophy and chronic microvascular ischemia.  No acute abnormality.   Original Report Authenticated By: Janeece Riggers, M.D.   Dg Chest Portable 1 View  07/11/2012   *RADIOLOGY REPORT*  Clinical Data: 72 year old female with syncope hypotension.  PORTABLE CHEST - 1 VIEW  Comparison: 05/27/2012 and earlier.  Findings: AP  portable view at 1647 hours.  Stable left chest cardiac AICD.  Cardiac size and mediastinal contours are within normal limits.  Allowing for portable technique, lung volumes are normal and the lungs are clear.  No pneumothorax or effusion.  IMPRESSION: No acute cardiopulmonary abnormality.   Original Report Authenticated By: Erskine Speed, M.D.    Assessment/Plan Principal Problem:   AKI (acute kidney injury) Active Problems:   Dehydration   DM2 (diabetes mellitus, type 2)   HTN (hypertension)   Dementia   Hyponatremia  Acute Renal Failure: - Cr now just under 3 from baseline of around  1.3 - Cont IVF as tolerated - Encourage PO as tolerated - Admit to med-tele - Will stop diuretics  Hyponatremia: - IVF as above - Will follow serial sodium levels - Likely secondary to dehydration  DM: - For now, will cont on home basal 5 units of lantus - Add SSI coverage  HTN: - Currently controlled s/p IVF in the ED - For now, cont meds and plan to d/c if bp drops  Dementia: - Stable  DVT prophylaxis: - SubQ heparin secondary to renal failure  Code Status: Full (must indicate code status--if unknown or must be presumed, indicate so) Family Communication: Pt in room (indicate person spoken with, if applicable, with phone number if by telephone) Disposition Plan: Pending (indicate anticipated LOS)  Time spent:  Paula [name] [facility] Pager [phone]  If 7PM-7AM, please contact night-coverage [email or web address] Password [facility] 07/11/2012, 6:22 PM

## 2012-07-11 NOTE — Progress Notes (Signed)
Notified Lynch,np that patient's CBG is 72. Lynch, NP stated to give Lantus 5units sq as scheduled at bedtime. Gave patient snack of apple sauce, nilla wafers and apple juice. Will continue to monitor patient. Nelda Marseille, RN

## 2012-07-11 NOTE — ED Provider Notes (Signed)
History    CSN: [id number] Arrival date & time 07/11/12  1511  First MD Initiated Contact with Patient 07/11/12 1513     Chief Complaint  Patient presents with  . Loss of Consciousness   (Consider location/radiation/quality/duration/timing/severity/associated sxs/prior Treatment) HPI Comments: Pt w/ hx of dementia and DM and remote CVA now w/ syncope. Per caregiver pt has had poor PO intake over past several wks. Today noted pt became unresponsive for approx 10 minutes. EMS called. caregive gave chest compressions, unsure if pt was pulseless. Pt became alert w/ chest compressions and when EMS arrived pt was awake and confused. No seizure activity noted, not post ictal, no loss of bladder control. EMS glucose 160s, noted to be hypotensive w/ SBP in the 80s.   Patient is a 72 y.o. female presenting with general illness. The history is provided by the patient. No language interpreter was used.  Illness Location:  CNS Quality:  Syncope Severity:  Moderate Onset quality:  Sudden Timing:  Rare Progression:  Resolved Chronicity:  New Associated symptoms: no abdominal pain, no chest pain, no congestion, no cough, no diarrhea, no fever, no headaches, no nausea, no rash, no shortness of breath, no sore throat and no vomiting    Past Medical History  Diagnosis Date  . Angina   . Shortness of breath   . DEMENTIA   . Hypertension   . Stroke   . Headache(784.0)   . Diabetes mellitus   . Dementia 05/27/2012   Past Surgical History  Procedure Laterality Date  . Insert / replace / remove pacemaker  12/15/2010  . Back surgery    . Abdominal hysterectomy     No family history on file. History  Substance Use Topics  . Smoking status: Passive Smoke Exposure - Never Smoker    Types: Cigarettes  . Smokeless tobacco: Never Used  . Alcohol Use: No   OB History   Grav Para Term Preterm Abortions TAB SAB Ect Mult Living                 Review of Systems  Constitutional: Negative for  fever and chills.  HENT: Negative for congestion and sore throat.   Respiratory: Negative for cough and shortness of breath.   Cardiovascular: Negative for chest pain and leg swelling.  Gastrointestinal: Negative for nausea, vomiting, abdominal pain, diarrhea and constipation.  Genitourinary: Negative for dysuria and frequency.  Skin: Negative for color change and rash.  Neurological: Positive for syncope. Negative for dizziness and headaches.  Psychiatric/Behavioral: Negative for confusion and agitation.  All other systems reviewed and are negative.    Allergies  Review of patient's allergies indicates no known allergies.  Home Medications   Current Outpatient Rx  Name  Route  Sig  Dispense  Refill  . alendronate (FOSAMAX) 70 MG tablet   Oral   Take 1 tablet (70 mg total) by mouth every 7 (seven) days. Take with a full glass of water on an empty stomach. On Wednesdays   4 tablet   3   . aspirin EC 325 MG tablet   Oral   Take 1 tablet (325 mg total) by mouth daily.   30 tablet   0   . B-D ULTRAFINE III SHORT PEN 31G X 8 MM MISC      USE 1 PEN NEEDLE 2 TIMES A DAY AS NEEDED   30 each   5   . ezetimibe (ZETIA) 10 MG tablet   Oral   Take 10 mg  by mouth daily.         . fenofibrate 160 MG tablet   Oral   Take 160 mg by mouth daily.         . furosemide (LASIX) 20 MG tablet   Oral   Take 20 mg by mouth daily.         . insulin aspart (NOVOLOG PENFILL) 100 UNIT/ML SOLN cartridge   Subcutaneous   Inject 3-5 Units into the skin 3 (three) times daily with meals.         . insulin glargine (LANTUS) 100 UNIT/ML injection   Subcutaneous   Inject 0.05 mLs (5 Units total) into the skin at bedtime.   10 mL   1   . isosorbide mononitrate (IMDUR) 30 MG 24 hr tablet   Oral   Take 1 tablet (30 mg total) by mouth daily.   30 tablet   1   . loratadine (CLARITIN) 10 MG tablet   Oral   Take 10 mg by mouth daily.           Marland Kitchen NIACIN PO   Oral   Take 1 tablet  by mouth daily.         . Omega-3 Fatty Acids (FISH OIL) 1200 MG CAPS   Oral   Take 1 capsule by mouth daily.         . pantoprazole (PROTONIX) 40 MG tablet   Oral   Take 40 mg by mouth daily.         . potassium chloride SA (K-DUR,KLOR-CON) 20 MEQ tablet   Oral   Take 20 mEq by mouth daily.         . traMADol (ULTRAM) 50 MG tablet   Oral   Take 1 tablet (50 mg total) by mouth every 6 (six) hours as needed. For pain   120 tablet   3    There were no vitals taken for this visit. Physical Exam  Vitals reviewed. Constitutional: She is oriented to person, place, and time. She appears well-developed and well-nourished. She appears ill. No distress.  HENT:  Head: Normocephalic and atraumatic.  Mouth/Throat: Mucous membranes are dry.  Eyes: EOM are normal. Pupils are equal, round, and reactive to light.  Neck: Normal range of motion. Neck supple.  Cardiovascular: Normal rate and regular rhythm.   Pulmonary/Chest: Effort normal and breath sounds normal. No respiratory distress.  Abdominal: Soft. She exhibits no distension. There is no tenderness.  Musculoskeletal: Normal range of motion. She exhibits no edema.  Neurological: She is alert and oriented to person, place, and time. No cranial nerve deficit or sensory deficit. She exhibits abnormal muscle tone (globally weak). GCS eye subscore is 4. GCS verbal subscore is 4. GCS motor subscore is 6.  Skin: Skin is warm and dry.  Psychiatric: She has a normal mood and affect. She is slowed and withdrawn.    ED Course  Procedures (including critical care time) Labs Reviewed  PRO B NATRIURETIC PEPTIDE  CBC WITH DIFFERENTIAL  COMPREHENSIVE METABOLIC PANEL  URINALYSIS, ROUTINE W REFLEX MICROSCOPIC   Results for orders placed during the hospital encounter of 07/11/12  PRO B NATRIURETIC PEPTIDE      Result Value Range   Pro B Natriuretic peptide (BNP) 299.3 (*) 0 - 125 pg/mL  CBC WITH DIFFERENTIAL      Result Value Range    WBC 5.2  4.0 - 10.5 K/uL   RBC 4.48  3.87 - 5.11 MIL/uL   Hemoglobin 12.7  12.0 -  15.0 g/dL   HCT 14.7  82.9 - 56.2 %   MCV 82.1  78.0 - 100.0 fL   MCH 28.3  26.0 - 34.0 pg   MCHC 34.5  30.0 - 36.0 g/dL   RDW 13.0  86.5 - 78.4 %   Platelets 198  150 - 400 K/uL   Neutrophils Relative % 70  43 - 77 %   Neutro Abs 3.7  1.7 - 7.7 K/uL   Lymphocytes Relative 23  12 - 46 %   Lymphs Abs 1.2  0.7 - 4.0 K/uL   Monocytes Relative 7  3 - 12 %   Monocytes Absolute 0.3  0.1 - 1.0 K/uL   Eosinophils Relative 0  0 - 5 %   Eosinophils Absolute 0.0  0.0 - 0.7 K/uL   Basophils Relative 0  0 - 1 %   Basophils Absolute 0.0  0.0 - 0.1 K/uL  COMPREHENSIVE METABOLIC PANEL      Result Value Range   Sodium 123 (*) 135 - 145 mEq/L   Potassium 4.0  3.5 - 5.1 mEq/L   Chloride 86 (*) 96 - 112 mEq/L   CO2 21  19 - 32 mEq/L   Glucose, Bld 149 (*) 70 - 99 mg/dL   BUN 65 (*) 6 - 23 mg/dL   Creatinine, Ser 6.96 (*) 0.50 - 1.10 mg/dL   Calcium 29.5 (*) 8.4 - 10.5 mg/dL   Total Protein 7.8  6.0 - 8.3 g/dL   Albumin 4.0  3.5 - 5.2 g/dL   AST 42 (*) 0 - 37 U/L   ALT 41 (*) 0 - 35 U/L   Alkaline Phosphatase 24 (*) 39 - 117 U/L   Total Bilirubin 0.6  0.3 - 1.2 mg/dL   GFR calc non Af Amer 15 (*) >90 mL/min   GFR calc Af Amer 17 (*) >90 mL/min  AMMONIA      Result Value Range   Ammonia 12  11 - 60 umol/L  CG4 I-STAT (LACTIC ACID)      Result Value Range   Lactic Acid, Venous 1.18  0.5 - 2.2 mmol/L   CT Head Wo Contrast (Final result)  Result time: 07/11/12 16:45:39    Final result by Rad Results In Interface (07/11/12 16:45:39)    Narrative:   *RADIOLOGY REPORT*  Clinical Data: Loss of consciousness. Dementia  CT HEAD WITHOUT CONTRAST  Technique: Contiguous axial images were obtained from the base of the skull through the vertex without contrast.  Comparison: 05/27/2012  Findings: Generalized atrophy is unchanged. Chronic microvascular ischemia in the white matter, left greater than right is  unchanged. 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  Negative for acute infarct. Negative for hemorrhage or mass. No acute skull abnormality.  IMPRESSION: Atrophy and chronic microvascular ischemia. No acute abnormality.   Original Report Authenticated By: Janeece Riggers, M.D.             DG Chest Portable 1 View (Final result)  Result time: 07/11/12 16:50:30    Final result by Rad Results In Interface (07/11/12 16:50:30)    Narrative:   *RADIOLOGY REPORT*  Clinical Data: 72 year old female with syncope hypotension.  PORTABLE CHEST - 1 VIEW  Comparison: 05/27/2012 and earlier.  Findings: AP portable view at 1647 hours. Stable left chest cardiac AICD. Cardiac size and mediastinal contours are within normal limits. Allowing for portable technique, lung volumes are normal and the lungs are clear. No pneumothorax or effusion.  IMPRESSION: No acute cardiopulmonary abnormality.  Original Report Authenticated By: Erskine Speed, M.D.       Angiocath insertion Performed by: Audelia Hives  Consent: Verbal consent obtained. Risks and benefits: risks, benefits and alternatives were discussed Time out: Immediately prior to procedure a "time out" was called to verify the correct patient, procedure, equipment, support staff and site/side marked as required.  Preparation: Patient was prepped and draped in the usual sterile fashion.  Vein Location: left brachial  Ultrasound Guided  Gauge: 18  Normal blood return and flush without difficulty Patient tolerance: Patient tolerated the procedure well with no immediate complications.     Date: 07/11/2012  Rate: 70  Rhythm: normal sinus rhythm  QRS Axis: indeterminate  Intervals: QRS 150  ST/T Wave abnormalities: normal  Conduction Disutrbances:nonspecific intraventricular conduction delay  Narrative Interpretation:   Old EKG Reviewed: changes noted TWI from prior ECG now normalized    No results found. No  diagnosis found.  MDM  Exam as above - significant for GCS 14, globally weak and listless. Following commands, moving all extremities, no focal deficit, pupils 3mm and reactive, abd benign, lungs clear. BP soft 90/60, no resp distress or hypoxia, afebrile, pulse in 80s. Broad diff at this point in pt w/ syncope. Doubt cardio/pulm arrest.  CT head obtained - NAICA, CXR - NACPF - no infiltrate. U/a pending. BUN/Cr elevated c/w AKI from dehydration, hyponatremia and hypochloremia - given 1 L IVF w/ improvement of hemodynamics. Glucose normal, mild elevation of LFTs, no leukocytosis, BNP 299, troponin pending, ECG - paced no acute ischemia noted. Ammonia normal. At this time pts sx likely 2/2 dehydration and failure to thrive. D/w internal medicine and pt admitted for further eval and rehydration. Admit in stable condition.   I have personally reviewed labs and imaging and considered in my MDM. Case d/w Dr Patria Mane  1. Hyponatremia   2. Adult failure to thrive   3. AKI (acute kidney injury)   4. Dehydration   5. Dementia, without behavioral disturbance      Audelia Hives, MD 07/12/12 [phone]

## 2012-07-11 NOTE — ED Notes (Signed)
Pt uncooperative in keeping her hand in position for iv fluids.

## 2012-07-12 DIAGNOSIS — E860 Dehydration: Secondary | ICD-10-CM

## 2012-07-12 DIAGNOSIS — E440 Moderate protein-calorie malnutrition: Secondary | ICD-10-CM | POA: Insufficient documentation

## 2012-07-12 DIAGNOSIS — E871 Hypo-osmolality and hyponatremia: Secondary | ICD-10-CM

## 2012-07-12 DIAGNOSIS — E119 Type 2 diabetes mellitus without complications: Secondary | ICD-10-CM

## 2012-07-12 DIAGNOSIS — N179 Acute kidney failure, unspecified: Principal | ICD-10-CM

## 2012-07-12 LAB — BASIC METABOLIC PANEL
Calcium: 9.6 mg/dL (ref 8.4–10.5)
GFR calc Af Amer: 23 mL/min — ABNORMAL LOW (ref >90)
GFR calc non Af Amer: 20 mL/min — ABNORMAL LOW (ref >90)
Glucose, Bld: 148 mg/dL — ABNORMAL HIGH (ref 70–99)
Potassium: 3.5 mEq/L (ref 3.5–5.1)
Sodium: 125 mEq/L — ABNORMAL LOW (ref 135–145)

## 2012-07-12 LAB — CBC
Hemoglobin: 11.2 g/dL — ABNORMAL LOW (ref 12.0–15.0)
MCH: 28.4 pg (ref 26.0–34.0)
MCHC: 34.4 g/dL (ref 30.0–36.0)
Platelets: 191 10*3/uL (ref 150–400)
RDW: 15.1 % (ref 11.5–15.5)

## 2012-07-12 LAB — GLUCOSE, CAPILLARY
Glucose-Capillary: 49 mg/dL — ABNORMAL LOW (ref 70–99)
Glucose-Capillary: 98 mg/dL (ref 70–99)
Glucose-Capillary: 68 mg/dL — ABNORMAL LOW (ref 70–99)

## 2012-07-12 LAB — COMPREHENSIVE METABOLIC PANEL
ALT: 33 U/L (ref 0–35)
AST: 30 U/L (ref 0–37)
CO2: 24 mEq/L (ref 19–32)
Calcium: 9.6 mg/dL (ref 8.4–10.5)
Chloride: 92 mEq/L — ABNORMAL LOW (ref 96–112)
GFR calc Af Amer: 21 mL/min — ABNORMAL LOW (ref >90)
GFR calc non Af Amer: 18 mL/min — ABNORMAL LOW (ref >90)
Glucose, Bld: 85 mg/dL (ref 70–99)
Sodium: 128 mEq/L — ABNORMAL LOW (ref 135–145)
Total Bilirubin: 0.5 mg/dL (ref 0.3–1.2)

## 2012-07-12 MED ORDER — GLUCOSE 40 % PO GEL
ORAL | Status: AC
  Administered 2012-07-12: 37.5 g
  Filled 2012-07-12: qty 1

## 2012-07-12 MED ORDER — GLUCOSE 40 % PO GEL
ORAL | Status: AC
  Filled 2012-07-12: qty 1

## 2012-07-12 MED ORDER — GLUCERNA SHAKE PO LIQD
237.0000 mL | Freq: Two times a day (BID) | ORAL | Status: DC
  Administered 2012-07-12 – 2012-07-13 (×3): 237 mL via ORAL
  Filled 2012-07-12: qty 237

## 2012-07-12 MED ORDER — SODIUM CHLORIDE 0.9 % IV BOLUS (SEPSIS)
1000.0000 mL | Freq: Once | INTRAVENOUS | Status: AC
  Administered 2012-07-12: 1000 mL via INTRAVENOUS

## 2012-07-12 MED ORDER — SODIUM CHLORIDE 0.9 % IJ SOLN
10.0000 mL | INTRAMUSCULAR | Status: DC | PRN
  Administered 2012-07-12 – 2012-07-15 (×4): 10 mL

## 2012-07-12 NOTE — Progress Notes (Signed)
Hypoglycemic Event  CBG: 55  Treatment: 15 GM carbohydrate snack  Symptoms: None  Follow-up CBG: Time:1229 CBG Result:98  Possible Reasons for Event: Unknown  Comments/MD notified:Dr. Lucrezia Starch  Remember to initiate Hypoglycemia Order Set & complete

## 2012-07-12 NOTE — Progress Notes (Signed)
Pt found to have CBG 59 given 15 mg carb snack, when tech rechecked pt CBG it was 49 and pt became symptomatic, tech stated pt "passed out" pt was rouseable for RN. Pt blood pressure was 88/59. MD notified, rapid response called. Pt with no iv access at this time, IV team to place PICC. Will continue to monitor and treat.

## 2012-07-12 NOTE — Progress Notes (Signed)
[facility] PROGRESS NOTE  Paula [name] WGN:[id number] DOB: May 09, 1940 DOA: 07/11/2012 PCP: No primary provider on file.  HPI/Subjective: No complaints this morning, I was recalled to the room because patient didn't have previous period of unconsciousness.  she is back to baseline, daughter is at bedside.    Assessment/Plan:  Acute renal failure -Likely secondary to dehydration, patient baseline creatinine is 1.8. Presented with creatinine of 2.9. -IV hydration to be started, patient does not have IV access. -IV team was unable to place a peripheral line, I will call procedure team to place central line. -Follow BMP closely.  Unresponsiveness -Likely multifactorial secondary to hypoglycemia/hypotension. -CBG was 52, patient received oral dextrose and the recheck is 125. -SBP is 90, we'll give bolus of IV fluids after the IV access will be placed.  Hyponatremia -Likely secondary to dehydration. -It seems that patient has hyponatremia chronically.  Hypertension -Currently hypotensive, I will discontinue all of her blood pressure medications as blood pressure is stable. -Low blood pressure is likely secondary to dehydration/volume depletion. -I will rule out other causes like infections/sepsis.  Diabetes mellitus type 2 -On 5 units of Lantus I will discontinue that, continue SSI coverage. -Carbohydrate modified diet.  Code Status: Full code  Family Communication: Plan discussed with the daughter at bedside.  Disposition Plan: Remains inpatient    Consultants:  Procedure team   Procedures:  None   Antibiotics:  None  Objective: Filed Vitals:   07/11/12 1950 07/12/12 0515 07/12/12 0920 07/12/12 0949  BP: 115/75 91/62 88/59  90/56  Pulse: 69 66 68   Temp: 97.8 F (36.6 C) 97.4 F (36.3 C) 98.1 F (36.7 C)   TempSrc: Oral Oral Oral   Resp: 16 16 20    Height: 5\' 3"  (1.6 m)     Weight: 73.4 kg (161 lb 13.1 oz)     SpO2: 92% 100%      Intake/Output  Summary (Last 24 hours) at 07/12/12 1037 Last data filed at 07/12/12 0522  Gross per 24 hour  Intake 486.66 ml  Output    650 ml  Net -163.34 ml   Filed Weights   07/11/12 1950  Weight: 73.4 kg (161 lb 13.1 oz)    Exam:  General: Alert and awake, oriented x3, not in any acute distress. HEENT: anicteric sclera, pupils reactive to light and accommodation, EOMI CVS: S1-S2 clear, no murmur rubs or gallops Chest: clear to auscultation bilaterally, no wheezing, rales or rhonchi Abdomen: soft nontender, nondistended, normal bowel sounds, no organomegaly Extremities: no cyanosis, clubbing or edema noted bilaterally Neuro: Cranial nerves II-XII intact, no focal neurological deficits  Data Reviewed: Basic Metabolic Panel:  Recent Labs Lab 07/11/12 1526 07/12/12 0032 07/12/12 0540  NA 123* 125* 128*  K 4.0 3.5 3.6  CL 86* 91* 92*  CO2 21 22 24   GLUCOSE 149* 148* 85  BUN 65* 58* 57*  CREATININE 2.95* 2.33* 2.47*  CALCIUM 11.0* 9.6 9.6   Liver Function Tests:  Recent Labs Lab 07/11/12 1526 07/12/12 0540  AST 42* 30  ALT 41* 33  ALKPHOS 24* 22*  BILITOT 0.6 0.5  PROT 7.8 6.6  ALBUMIN 4.0 3.4*   No results found for this basename: LIPASE, AMYLASE,  in the last 168 hours  Recent Labs Lab 07/11/12 1538  AMMONIA 12   CBC:  Recent Labs Lab 07/11/12 1526 07/12/12 0540  WBC 5.2 5.5  NEUTROABS 3.7  --   HGB 12.7 11.2*  HCT 36.8 32.6*  MCV 82.1 82.5  PLT 198  191   Cardiac Enzymes: No results found for this basename: CKTOTAL, CKMB, CKMBINDEX, TROPONINI,  in the last 168 hours BNP (last 3 results)  Recent Labs  07/11/12 1526  PROBNP 299.3*   CBG:  Recent Labs Lab 07/11/12 2134 07/11/12 2308 07/12/12 0829 07/12/12 0906 07/12/12 0927  GLUCAP 72 123* 52* 49* 125*    No results found for this or any previous visit (from the past 240 hour(s)).   Studies: Ct Head Wo Contrast  07/11/2012   *RADIOLOGY REPORT*  Clinical Data: Loss of consciousness.   Dementia  CT HEAD WITHOUT CONTRAST  Technique:  Contiguous axial images were obtained from the base of the skull through the vertex without contrast.  Comparison: 05/27/2012  Findings: Generalized atrophy is unchanged.  Chronic microvascular ischemia in the white matter, left greater than right is unchanged. 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  Negative for acute infarct.  Negative for hemorrhage or mass.  No acute skull abnormality.  IMPRESSION: Atrophy and chronic microvascular ischemia.  No acute abnormality.   Original Report Authenticated By: Janeece Riggers, M.D.   Dg Chest Portable 1 View  07/11/2012   *RADIOLOGY REPORT*  Clinical Data: 72 year old female with syncope hypotension.  PORTABLE CHEST - 1 VIEW  Comparison: 05/27/2012 and earlier.  Findings: AP portable view at 1647 hours.  Stable left chest cardiac AICD.  Cardiac size and mediastinal contours are within normal limits.  Allowing for portable technique, lung volumes are normal and the lungs are clear.  No pneumothorax or effusion.  IMPRESSION: No acute cardiopulmonary abnormality.   Original Report Authenticated By: Erskine Speed, M.D.    Scheduled Meds: . sodium chloride   Intravenous STAT  . aspirin EC  325 mg Oral Daily  . ezetimibe  10 mg Oral Daily  . feeding supplement  237 mL Oral BID BM  . fenofibrate  160 mg Oral Daily  . heparin  5,000 Units Subcutaneous Q8H  . insulin aspart  0-15 Units Subcutaneous TID WC  . insulin aspart  0-5 Units Subcutaneous QHS  . insulin glargine  5 Units Subcutaneous QHS  . isosorbide mononitrate  30 mg Oral Daily  . omega-3 acid ethyl esters  1 g Oral BID  . pantoprazole  40 mg Oral Daily  . pneumococcal 23 valent vaccine  0.5 mL Intramuscular Tomorrow-1000  . sodium chloride  3 mL Intravenous Q12H   Continuous Infusions: . sodium chloride Stopped (07/12/12 0050)    Principal Problem:   AKI (acute kidney injury) Active Problems:   Dehydration   DM2 (diabetes mellitus, type  2)   HTN (hypertension)   Dementia   Hyponatremia    Time spent: 35 minutes    [facility]  [facility] Pager [phone]. If 7PM-7AM, please contact night-coverage at [email or web address], password [facility] 07/12/2012, 10:37 AM  LOS: 1 day

## 2012-07-12 NOTE — Progress Notes (Signed)
Peripherally Inserted Central Catheter/Midline Placement  The IV Nurse has discussed with the patient and/or persons authorized to consent for the patient, the purpose of this procedure and the potential benefits and risks involved with this procedure.  The benefits include less needle sticks, lab draws from the catheter and patient may be discharged home with the catheter.  Risks include, but not limited to, infection, bleeding, blood clot (thrombus formation), and puncture of an artery; nerve damage and irregular heat beat.  Alternatives to this procedure were also discussed.  PICC/Midline Placement Documentation  PICC / Midline Double Lumen 07/12/12 PICC Right Basilic (Active)  Indication for Insertion or Continuance of Line Limited venous access - need for IV therapy >5 days (PICC only) 07/12/2012  3:07 PM  Length mark (cm) 0 cm 07/12/2012  3:07 PM  Site Assessment Clean;Dry;Intact 07/12/2012  3:07 PM  Lumen #1 Status Flushed;Saline locked;Blood return noted 07/12/2012  3:07 PM  Lumen #2 Status Flushed;Saline locked;Blood return noted 07/12/2012  3:07 PM  Dressing Type Transparent 07/12/2012  3:07 PM  Dressing Status Clean;Dry;Intact;Antimicrobial disc in place 07/12/2012  3:07 PM  Line Care Connections checked and tightened 07/12/2012  3:07 PM  Dressing Intervention New dressing 07/12/2012  3:07 PM  Dressing Change Due 07/19/12 07/12/2012  3:07 PM       Geoffery Spruce 07/12/2012, 3:08 PM

## 2012-07-12 NOTE — Progress Notes (Signed)
OT Cancellation Note  Patient Details Name: Paula [name] MRN: [id number] DOB: 10-13-1940   Cancelled Treatment:    Reason Eval/Treat Not Completed: Patient not medically ready (bedrest)  Lucile Shutters Pager: [phone]  07/12/2012, 1:38 PM

## 2012-07-12 NOTE — Progress Notes (Signed)
Notified Lynch, NP that patient's IV infiltrated. IV team unable to gain assess with doppler machine. No new orders given. Burnadette Peter, NP stated that she will let morning team know. Will continue to monitor patient. Nelda Marseille, RN

## 2012-07-12 NOTE — ED Provider Notes (Signed)
I saw and evaluated the patient, reviewed the resident's note and I agree with the findings and plan. I personally evaluated the ECG and agree with the interpretation of the resident  I was present for IV insertion    Lyanne Co, MD 07/12/12 1930

## 2012-07-12 NOTE — Progress Notes (Signed)
Hypoglycemic Event  CBG: 49  Treatment: 15 GM gel  Symptoms: Sweaty and passed out  Follow-up CBG: [id number] CBG Result:125  Possible Reasons for Event: Unknown  Comments/MD notified: Hurshel Keys, Dagmar Hait  Remember to initiate Hypoglycemia Order Set & complete

## 2012-07-12 NOTE — Significant Event (Signed)
Rapid Response Event Note  Overview: Called to see patient for acute hypotensive event.       Initial Focused Assessment: Upon arrival patient is being assisted to use bedpan. Nurses and family report LOC is back to baseline. Family thought she was napping, but with CBG, by nurse report, blood glucose was low. GIven PO dextrose with recovery.  Interventions: TOok manual BP in right arm. Pulses are faint but present. BP prior reported at 88/50 by RN (with 120 systolic beign normal for the patient). My reassessment revealed 90/56.  At 0946 IVT with ultrasound PIV locater at bedside to assess for IV placement. MD plan to call CCM for Central line placement if this is not successful.  Have asked for doppler to be at bedside until patient IV access is established-WIll follow up.    Event Summary:   at      at          Paula [name]

## 2012-07-12 NOTE — Progress Notes (Signed)
Hypoglycemic Event  CBG: 52  Treatment: 15 GM carbohydrate snack  Symptoms: None  Follow-up CBG: Time: 0910 CBG Result: 49  Possible Reasons for Event: Inadequate meal intake  Comments/MD notified: Dr. Georgina Peer, Misty Stanley  Remember to initiate Hypoglycemia Order Set & complete

## 2012-07-12 NOTE — Progress Notes (Signed)
Patient admitted to 5w06 from ED. Patient lives at home with daughter. Patient is A&Ox2 and is confused. Patient placed on tele. Patient's caregiver from home at bedside. Patient's skin is WNL. Patient has bilateral lower extremity pitting edema. Will continue to monitor patient. Nelda Marseille, RN

## 2012-07-12 NOTE — Progress Notes (Signed)
INITIAL NUTRITION ASSESSMENT  DOCUMENTATION CODES Per approved criteria  -Non-severe (moderate) malnutrition in the context of acute illness or injury   INTERVENTION: 1. Glucerna Shake po BID, each supplement provides 220 kcal and 10 grams of protein. Encouraged family to continue supplements at home as well.  NUTRITION DIAGNOSIS: Inadequate oral intake related to poor appetite and forgetting to eat as evidenced by weight loss.   Goal: PO intake to meet >/=90% estimated nutrition needs.   Monitor:  PO intake, weight trends, labs, I/O's  Reason for Assessment: Malnutrition screening tool  72 y.o. female  Admitting Dx: AKI (acute kidney injury)  ASSESSMENT: Pt admitted from home where pt was found poorly responsive. Pt had been eating poorly. On admission found with elevated Cr, low sodium.  Pt family state they would leave food for the pt in the refrigerator and when they would return home in the afternoon, it was not eaten. Skipping breakfast and lunch most days. Pt states her appetite is great this morning, eating breakfast at time of RD visit.   Spoke with family about adding oral nutrition supplements and other ways to help remind pt to eat during the day when family is not around to remind her. Pt does have a HH RN who visits her.   Pt meets criteria for moderate malnutrition in setting of acute illness 2/2 17 lb weight loss in 2 months, 9.5% body weight and meeting </=75% estimated nutrition needs for >7 days.    Height: Ht Readings from Last 1 Encounters:  07/11/12 5\' 3"  (1.6 m)    Weight: Wt Readings from Last 1 Encounters:  07/11/12 161 lb 13.1 oz (73.4 kg)    Ideal Body Weight: 115 lbs   % Ideal Body Weight: 140%  Wt Readings from Last 10 Encounters:  07/11/12 161 lb 13.1 oz (73.4 kg)  05/31/12 178 lb (80.74 kg)  12/16/10 189 lb 2.5 oz (85.8 kg)  12/16/10 189 lb 2.5 oz (85.8 kg)    Usual Body Weight: 178 lbs most recently per hx   % Usual Body Weight:  90%  BMI:  Body mass index is 28.67 kg/(m^2). Overweight   Estimated Nutritional Needs: Kcal: 1500-1700 Protein: 75-85 gm  Fluid: >/=2 L daily  Skin: intact   Diet Order: General  EDUCATION NEEDS: -No education needs identified at this time   Intake/Output Summary (Last 24 hours) at 07/12/12 0823 Last data filed at 07/12/12 0522  Gross per 24 hour  Intake 486.66 ml  Output    650 ml  Net -163.34 ml    Last BM: PTA    Labs:   Recent Labs Lab 07/11/12 1526 07/12/12 0032 07/12/12 0540  NA 123* 125* 128*  K 4.0 3.5 3.6  CL 86* 91* 92*  CO2 21 22 24   BUN 65* 58* 57*  CREATININE 2.95* 2.33* 2.47*  CALCIUM 11.0* 9.6 9.6  GLUCOSE 149* 148* 85    CBG (last 3)   Recent Labs  07/11/12 2134 07/11/12 2308  GLUCAP 72 123*    Scheduled Meds: . sodium chloride   Intravenous STAT  . aspirin EC  325 mg Oral Daily  . ezetimibe  10 mg Oral Daily  . fenofibrate  160 mg Oral Daily  . heparin  5,000 Units Subcutaneous Q8H  . insulin aspart  0-15 Units Subcutaneous TID WC  . insulin aspart  0-5 Units Subcutaneous QHS  . insulin glargine  5 Units Subcutaneous QHS  . isosorbide mononitrate  30 mg Oral Daily  .  omega-3 acid ethyl esters  1 g Oral BID  . pantoprazole  40 mg Oral Daily  . pneumococcal 23 valent vaccine  0.5 mL Intramuscular Tomorrow-1000  . sodium chloride  3 mL Intravenous Q12H    Continuous Infusions: . sodium chloride Stopped (07/12/12 0050)    Past Medical History  Diagnosis Date  . Angina   . Shortness of breath   . DEMENTIA   . Hypertension   . Stroke   . Headache(784.0)   . Diabetes mellitus   . Dementia 05/27/2012    Past Surgical History  Procedure Laterality Date  . Insert / replace / remove pacemaker  12/15/2010  . Back surgery    . Abdominal hysterectomy      Clarene Duke RD, LDN Pager [phone] After Hours pager [phone]

## 2012-07-12 NOTE — Progress Notes (Signed)
PT Cancellation Note  Patient Details Name: Paula [name] MRN: [id number] DOB: 08-Aug-1940   Cancelled Treatment:    Reason Eval/Treat Not Completed: Patient not medically ready (bedrest per treatment team notes)   Fabio Asa 07/12/2012, 12:14 PM

## 2012-07-13 DIAGNOSIS — R627 Adult failure to thrive: Secondary | ICD-10-CM

## 2012-07-13 LAB — GLUCOSE, CAPILLARY
Glucose-Capillary: 32 mg/dL — CL (ref 70–99)
Glucose-Capillary: 149 mg/dL — ABNORMAL HIGH (ref 70–99)

## 2012-07-13 LAB — BASIC METABOLIC PANEL
BUN: 34 mg/dL — ABNORMAL HIGH (ref 6–23)
Calcium: 9.1 mg/dL (ref 8.4–10.5)
Creatinine, Ser: 1.77 mg/dL — ABNORMAL HIGH (ref 0.50–1.10)
GFR calc Af Amer: 32 mL/min — ABNORMAL LOW (ref >90)
GFR calc non Af Amer: 28 mL/min — ABNORMAL LOW (ref >90)
Potassium: 3.4 mEq/L — ABNORMAL LOW (ref 3.5–5.1)

## 2012-07-13 LAB — CBC
MCH: 28.7 pg (ref 26.0–34.0)
MCHC: 34.7 g/dL (ref 30.0–36.0)
Platelets: 172 10*3/uL (ref 150–400)
RDW: 15.3 % (ref 11.5–15.5)

## 2012-07-13 MED ORDER — POTASSIUM CHLORIDE CRYS ER 20 MEQ PO TBCR
60.0000 meq | EXTENDED_RELEASE_TABLET | Freq: Once | ORAL | Status: AC
  Administered 2012-07-13: 60 meq via ORAL
  Filled 2012-07-13: qty 3

## 2012-07-13 NOTE — Progress Notes (Signed)
[facility] PROGRESS NOTE  REDITH DRACH BJY:[id number] DOB: April 01, 1940 DOA: 07/11/2012 PCP: No primary provider on file.  HPI/Subjective: Feels better this morning, feels hungry and wants to eat. Denies any other significant complaints.  Assessment/Plan:  Acute renal failure -Likely secondary to dehydration, patient baseline creatinine is 1.8. Presented with creatinine of 2.9. -IV hydration to be started, patient does not have IV access. -PICC placed yesterday by IV team. Started on IV fluids. -ARF is improving, follow BMP closely.  Hypoglycemia -Likely secondary to combination of Lantus insulin/ARF/poor oral intake. -Lantus discontinued, encourage oral intake, ARF is improving. -She had a CBG of 55 today, no symptoms reported.  Unresponsiveness -This is resolved, Likely multifactorial secondary to hypoglycemia/hypotension. -CBG was 52, patient received oral dextrose and the recheck is 125. -SBP is 90, we'll give bolus of IV fluids after the IV access will be placed.  Hyponatremia -Likely secondary to dehydration. Improving sodium today is 130. -It seems that patient has hyponatremia chronically.  Hypertension -Currently hypotensive, I will discontinue all of her blood pressure medications as blood pressure is stable. -Low blood pressure is likely secondary to dehydration/volume depletion. -I will rule out other causes like infections/sepsis.  Diabetes mellitus type 2 -On 5 units of Lantus I will discontinue that, continue SSI coverage. -Carbohydrate modified diet.  Code Status: Full code  Family Communication: Plan discussed with the daughter at bedside.  Disposition Plan: Remains inpatient    Consultants:  Procedure team   Procedures:  None   Antibiotics:  None  Objective: Filed Vitals:   07/12/12 1513 07/12/12 1709 07/12/12 2046 07/13/12 0605  BP: 96/67 108/75 92/44 108/57  Pulse: 69 64 69 63  Temp: 99 F (37.2 C) 98.2 F (36.8 C)  97.5 F  (36.4 C)  TempSrc: Oral Oral Oral Oral  Resp: 18 18 18 18   Height:      Weight:      SpO2:   100% 99%    Intake/Output Summary (Last 24 hours) at 07/13/12 0917 Last data filed at 07/13/12 0644  Gross per 24 hour  Intake   1165 ml  Output   3402 ml  Net  -2237 ml   Filed Weights   07/11/12 1950  Weight: 73.4 kg (161 lb 13.1 oz)    Exam:  General: Alert and awake, oriented x3, not in any acute distress. HEENT: anicteric sclera, pupils reactive to light and accommodation, EOMI CVS: S1-S2 clear, no murmur rubs or gallops Chest: clear to auscultation bilaterally, no wheezing, rales or rhonchi Abdomen: soft nontender, nondistended, normal bowel sounds, no organomegaly Extremities: no cyanosis, clubbing or edema noted bilaterally Neuro: Cranial nerves II-XII intact, no focal neurological deficits  Data Reviewed: Basic Metabolic Panel:  Recent Labs Lab 07/11/12 1526 07/12/12 0032 07/12/12 0540 07/13/12 0500  NA 123* 125* 128* 130*  K 4.0 3.5 3.6 3.4*  CL 86* 91* 92* 102  CO2 21 22 24 23   GLUCOSE 149* 148* 85 81  BUN 65* 58* 57* 34*  CREATININE 2.95* 2.33* 2.47* 1.77*  CALCIUM 11.0* 9.6 9.6 9.1   Liver Function Tests:  Recent Labs Lab 07/11/12 1526 07/12/12 0540  AST 42* 30  ALT 41* 33  ALKPHOS 24* 22*  BILITOT 0.6 0.5  PROT 7.8 6.6  ALBUMIN 4.0 3.4*   No results found for this basename: LIPASE, AMYLASE,  in the last 168 hours  Recent Labs Lab 07/11/12 1538  AMMONIA 12   CBC:  Recent Labs Lab 07/11/12 1526 07/12/12 0540 07/13/12 0500  WBC  5.2 5.5 3.3*  NEUTROABS 3.7  --   --   HGB 12.7 11.2* 9.5*  HCT 36.8 32.6* 27.4*  MCV 82.1 82.5 82.8  PLT 198 191 172   Cardiac Enzymes: No results found for this basename: CKTOTAL, CKMB, CKMBINDEX, TROPONINI,  in the last 168 hours BNP (last 3 results)  Recent Labs  07/11/12 1526  PROBNP 299.3*   CBG:  Recent Labs Lab 07/12/12 1817 07/12/12 1819 07/12/12 2242 07/13/12 0740 07/13/12 0744   GLUCAP 37* 96 87 55* 76    No results found for this or any previous visit (from the past 240 hour(s)).   Studies: Ct Head Wo Contrast  07/11/2012   *RADIOLOGY REPORT*  Clinical Data: Loss of consciousness.  Dementia  CT HEAD WITHOUT CONTRAST  Technique:  Contiguous axial images were obtained from the base of the skull through the vertex without contrast.  Comparison: 05/27/2012  Findings: Generalized atrophy is unchanged.  Chronic microvascular ischemia in the white matter, left greater than right is unchanged. 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  Negative for acute infarct.  Negative for hemorrhage or mass.  No acute skull abnormality.  IMPRESSION: Atrophy and chronic microvascular ischemia.  No acute abnormality.   Original Report Authenticated By: Janeece Riggers, M.D.   Dg Chest Portable 1 View  07/11/2012   *RADIOLOGY REPORT*  Clinical Data: 72 year old female with syncope hypotension.  PORTABLE CHEST - 1 VIEW  Comparison: 05/27/2012 and earlier.  Findings: AP portable view at 1647 hours.  Stable left chest cardiac AICD.  Cardiac size and mediastinal contours are within normal limits.  Allowing for portable technique, lung volumes are normal and the lungs are clear.  No pneumothorax or effusion.  IMPRESSION: No acute cardiopulmonary abnormality.   Original Report Authenticated By: Erskine Speed, M.D.    Scheduled Meds: . aspirin EC  325 mg Oral Daily  . ezetimibe  10 mg Oral Daily  . feeding supplement  237 mL Oral BID BM  . fenofibrate  160 mg Oral Daily  . heparin  5,000 Units Subcutaneous Q8H  . insulin aspart  0-15 Units Subcutaneous TID WC  . omega-3 acid ethyl esters  1 g Oral BID  . pantoprazole  40 mg Oral Daily  . pneumococcal 23 valent vaccine  0.5 mL Intramuscular Tomorrow-1000  . sodium chloride  3 mL Intravenous Q12H   Continuous Infusions: . sodium chloride 100 mL/hr at 07/13/12 4098    Principal Problem:   AKI (acute kidney injury) Active Problems:    Dehydration   DM2 (diabetes mellitus, type 2)   HTN (hypertension)   Dementia   Hyponatremia   Malnutrition of moderate degree    Time spent: 35 minutes    [facility]  [facility] Pager [phone]. If 7PM-7AM, please contact night-coverage at [email or web address], password [facility] 07/13/2012, 9:17 AM  LOS: 2 days

## 2012-07-13 NOTE — Evaluation (Signed)
Physical Therapy Evaluation Patient Details Name: Paula [name] MRN: [id number] DOB: 10-21-1940 Today's Date: 07/13/2012 Time: 6962-9528 PT Time Calculation (min): 27 min  PT Assessment / Plan / Recommendation History of Present Illness  Paula [name] is a 72 y.o. female with a hx of diabetes, htn, and dementia who presents to the ED after being found poorly responsive. The patient was noted to have decreased appetite and decreased PO intake several weeks leading up to the presenting event. Prior to the arrival EMS, the pt's caregiver initiated CPR on the patient, resulting in the patient rapidly awakening. She was then transported to the ED where a Cr was noted to be just under 3 ( baseline of around 1.3). A serum sodium was noted to be 123. The patient was found to be initially hypotensive and aggressive fluids were initiated  Clinical Impression  Very limited evaluation due to cognitive impairment.  Pt very childlike and following one step commands inconsistently.  Pt will benefit from acute PT services to improve overall mobility and prepare for safe d/c home with 24 hour assistance and HHPT.    PT Assessment  Patient needs continued PT services    Follow Up Recommendations  Home health PT;Supervision/Assistance - 24 hour    Equipment Recommendations  Other (comment) (Need to be determine)    Recommendations for Other Services     Frequency Min 3X/week    Precautions / Restrictions Precautions Precautions: Fall   Pertinent Vitals/Pain No c/o pain      Mobility  Bed Mobility Bed Mobility: Supine to Sit;Sitting - Scoot to Edge of Bed Supine to Sit: 4: Min assist;HOB elevated Sitting - Scoot to [place] of Bed: 4: Min assist;With rail (pad) Details for Bed Mobility Assistance: (A) to elevate trunk OOB with cues for technique Transfers Transfers: Sit to Stand;Stand to Sit Sit to Stand: 4: Min assist;From bed Stand to Sit: 4: Min assist;To chair/3-in-1 Details for  Transfer Assistance: (A) to initiate transfer and slowly descend to recliner with cues for technique Ambulation/Gait Ambulation/Gait Assistance: 4: Min assist Ambulation Distance (Feet): 5 Feet Assistive device: 1 person hand held assist Ambulation/Gait Assistance Details: (A) to maintain balance with cues for technique.  Limited ambulation due to impulsive nature and safety concerns for further ambulation.  Gait Pattern: Step-through pattern;Decreased stride length    Exercises     PT Diagnosis: Difficulty walking;Generalized weakness  PT Problem List: Decreased strength;Decreased activity tolerance;Decreased balance;Decreased mobility;Decreased coordination;Decreased cognition;Decreased knowledge of use of DME;Decreased safety awareness;Decreased knowledge of precautions PT Treatment Interventions: DME instruction;Gait training;Functional mobility training;Therapeutic activities;Therapeutic exercise;Balance training;Neuromuscular re-education     PT Goals(Current goals can be found in the care plan section) Acute Rehab PT Goals Patient Stated Goal: unablet to set PT Goal Formulation: Patient unable to participate in goal setting Time For Goal Achievement: 07/27/12 Potential to Achieve Goals: Fair  Visit Information  Last PT Received On: 07/13/12 Assistance Needed: +1 History of Present Illness: Paula [name] is a 72 y.o. female with a hx of diabetes, htn, and dementia who presents to the ED after being found poorly responsive. The patient was noted to have decreased appetite and decreased PO intake several weeks leading up to the presenting event. Prior to the arrival EMS, the pt's caregiver initiated CPR on the patient, resulting in the patient rapidly awakening. She was then transported to the ED where a Cr was noted to be just under 3 ( baseline of around 1.3). A serum sodium was noted to be 123. The  patient was found to be initially hypotensive and aggressive fluids were  initiated       Prior Functioning  Home Living Family/patient expects to be discharged to:: Private residence Living Arrangements: Children Available Help at Discharge: Available 24 hours/day;Family;Personal care attendant Type of Home: House Home Access: Stairs to enter [employer] of Steps: 2 Additional Comments: Limited home environment given and unable to determine PLOF due to pt's cognition Communication Communication: No difficulties Dominant Hand: Right    Cognition  Cognition Arousal/Alertness: Awake/alert Behavior During Therapy: Impulsive Overall Cognitive Status: Impaired/Different from baseline Area of Impairment: Orientation;Attention;Following commands;Safety/judgement;Awareness;Problem solving Orientation Level: Disoriented to;Place;Time;Situation Current Attention Level: Focused Memory: Decreased short-term memory Following Commands: Follows one step commands inconsistently Safety/Judgement: Decreased awareness of safety Awareness: Intellectual Problem Solving: Slow processing;Difficulty sequencing;Requires verbal cues;Requires tactile cues General Comments: Pt with child like behavior and impulsive.  Pt would laugh spontaneously then have concerned facial expression the next. When asked pt where she was "She said the [facility] place"  (then laugh)    Extremity/Trunk Assessment Lower Extremity Assessment Lower Extremity Assessment: Generalized weakness   Balance Balance Balance Assessed: Yes Static Sitting Balance Static Sitting - Balance Support: Feet supported Static Sitting - Level of Assistance: 5: Stand by assistance  End of Session PT - End of Session Equipment Utilized During Treatment: Gait belt Activity Tolerance: Patient tolerated treatment well Patient left: in chair;with call bell/phone within reach;with chair alarm set Nurse Communication: Mobility status  GP     Sadonna Kotara 07/13/2012, 3:01 PM  Jake Shark, PT  DPT [phone]

## 2012-07-14 DIAGNOSIS — F0390 Unspecified dementia without behavioral disturbance: Secondary | ICD-10-CM

## 2012-07-14 LAB — CBC
HCT: 25.2 % — ABNORMAL LOW (ref 36.0–46.0)
MCV: 82.4 fL (ref 78.0–100.0)
Platelets: 168 10*3/uL (ref 150–400)
RBC: 3.06 MIL/uL — ABNORMAL LOW (ref 3.87–5.11)
RDW: 15.5 % (ref 11.5–15.5)
WBC: 3.9 10*3/uL — ABNORMAL LOW (ref 4.0–10.5)

## 2012-07-14 LAB — BASIC METABOLIC PANEL
CO2: 21 mEq/L (ref 19–32)
Chloride: 101 mEq/L (ref 96–112)
GFR calc Af Amer: 46 mL/min — ABNORMAL LOW (ref >90)
Sodium: 129 mEq/L — ABNORMAL LOW (ref 135–145)

## 2012-07-14 LAB — GLUCOSE, CAPILLARY
Glucose-Capillary: 37 mg/dL — CL (ref 70–99)
Glucose-Capillary: 103 mg/dL — ABNORMAL HIGH (ref 70–99)
Glucose-Capillary: 114 mg/dL — ABNORMAL HIGH (ref 70–99)

## 2012-07-14 NOTE — Evaluation (Signed)
Occupational Therapy Evaluation and Discharge Patient Details Name: KEYRI SALBERG MRN: [id number] DOB: [birth year] Today's Date: 07/14/2012 Time: 1478-2956 OT Time Calculation (min): 17 min  OT Assessment / Plan / Recommendation History of present illness MASIAH LEWING is a 72 y.o. female with a hx of diabetes, htn, and dementia who presents to the ED after being found poorly responsive. The patient was noted to have decreased appetite and decreased PO intake several weeks leading up to the presenting event. Prior to the arrival EMS, the pt's caregiver initiated CPR on the patient, resulting in the patient rapidly awakening. She was then transported to the ED where a Cr was noted to be just under 3 ( baseline of around 1.3). A serum sodium was noted to be 123. The patient was found to be initially hypotensive and aggressive fluids were initiated   Clinical Impression   Pt admitted with above.Pt at a set-up/S level for UBBADLs and Min a for LBADLs and has 24 hour care at home. No further OT needs, will sign off.      OT Assessment  Patient does not need any further OT services    Follow Up Recommendations  No OT follow up       Equipment Recommendations  None recommended by OT          Precautions / Restrictions Precautions Precautions: Fall Restrictions Weight Bearing Restrictions: No   Pertinent Vitals/Pain Right ankle and slightly above when it is touched but not with weight bearing; removed socks which were causing an indention due to edema.    ADL  Equipment Used: Gait belt Transfers/Ambulation Related to ADLs: Min A sit<>stand and for HHA ambulation in room ADL Comments: setup/S for UBADLs, min A for LBADLs and tolieting        Visit Information  Last OT Received On: 07/14/12 Assistance Needed: +1 History of Present Illness: ELIYA BUBAR is a 72 y.o. female with a hx of diabetes, htn, and dementia who presents to the ED after being found poorly  responsive. The patient was noted to have decreased appetite and decreased PO intake several weeks leading up to the presenting event. Prior to the arrival EMS, the pt's caregiver initiated CPR on the patient, resulting in the patient rapidly awakening. She was then transported to the ED where a Cr was noted to be just under 3 ( baseline of around 1.3). A serum sodium was noted to be 123. The patient was found to be initially hypotensive and aggressive fluids were initiated       Prior Functioning     Home Living Family/patient expects to be discharged to:: Private residence Living Arrangements: Children Available Help at Discharge: Family;Personal care attendant;Available 24 hours/day Type of Home: House Home Access: Stairs to enter [employer] of Steps: 2 Home Layout: One level Home Equipment: Shower seat Prior Function Level of Independence: Needs assistance ADL's / Homemaking Assistance Needed: Per pt her aid helps her do all of her bathing and dressing as well as IADLs Communication Communication: No difficulties Dominant Hand: Right         Vision/Perception Vision - History Patient Visual Report: No change from baseline   Cognition  Cognition Arousal/Alertness: Awake/alert Behavior During Therapy: WFL for tasks assessed/performed Overall Cognitive Status: History of cognitive impairments - at baseline    Extremity/Trunk Assessment Upper Extremity Assessment Upper Extremity Assessment: Overall WFL for tasks assessed     Mobility Bed Mobility Bed Mobility: Supine to Sit;Sitting - Scoot to [place] of  Bed;Sit to Supine Supine to Sit: 5: Supervision;HOB flat Sitting - Scoot to Edge of Bed: 5: Supervision Sit to Supine: 5: Supervision;HOB flat Transfers Transfers: Sit to Stand;Stand to Sit Sit to Stand: 4: Min assist;From bed;With upper extremity assist Stand to Sit: 4: Min assist;To bed;Without upper extremity assist           End of Session OT - End of  Session Activity Tolerance: Treatment limited secondary to agitation Patient left: in bed;with call bell/phone within reach;with bed alarm set Nurse Communication:  (NT=+1 min A )       Evette Georges [phone] 07/14/2012, 4:08 PM

## 2012-07-14 NOTE — Progress Notes (Signed)
[facility] PROGRESS NOTE  Paula [name] BJY:[id number] DOB: 02-May-1940 DOA: 07/11/2012 PCP: No primary provider on file.  HPI/Subjective: Denies any significant complaints.  Assessment/Plan:  Acute renal failure -Likely secondary to dehydration, patient baseline creatinine is 1.3. Presented with creatinine of 2.9. -IV hydration to be started, patient does not have IV access. -PICC placed by IV team. Started on IV fluids. -ARF is resolved, follow BMP closely.  Hypoglycemia -Likely secondary to combination of Lantus insulin/ARF/poor oral intake. -Lantus discontinued, encourage oral intake, ARF is improving. -She had a CBG of 55 today, no symptoms reported.  Unresponsiveness -This is resolved, Likely multifactorial secondary to hypoglycemia/hypotension. -CBG was 49, patient received oral dextrose and the recheck is 125. -SBP was 90 but improved to normal now.  Hyponatremia -Likely secondary to dehydration. Improving sodium today is 130. -It seems that patient has hyponatremia chronically.  Hypertension -Currently hypotensive, I will discontinue all of her blood pressure medications as blood pressure is stable. -Low blood pressure is likely secondary to dehydration/volume depletion. -I will rule out other causes like infections/sepsis.  Diabetes mellitus type 2 -On 5 units of Lantus I will discontinue that, continue SSI coverage. -Carbohydrate modified diet.  Code Status: Full code  Family Communication:  Disposition Plan: Remains inpatient    Consultants:  Procedure team   Procedures:  None   Antibiotics:  None  Objective: Filed Vitals:   07/13/12 0605 07/13/12 1435 07/13/12 2154 07/14/12 0528  BP: 108/57 120/79 132/73 118/77  Pulse: 63 64 67 60  Temp: 97.5 F (36.4 C) 98.8 F (37.1 C) 98.3 F (36.8 C) 98.5 F (36.9 C)  TempSrc: Oral Oral Oral Oral  Resp: 18 18 18 20   Height:      Weight:      SpO2: 99%  100% 100%    Intake/Output  Summary (Last 24 hours) at 07/14/12 1000 Last data filed at 07/14/12 0600  Gross per 24 hour  Intake 2170.83 ml  Output   2754 ml  Net -583.17 ml   Filed Weights   07/11/12 1950  Weight: 73.4 kg (161 lb 13.1 oz)    Exam:  General: Alert and awake, oriented x3, not in any acute distress. HEENT: anicteric sclera, pupils reactive to light and accommodation, EOMI CVS: S1-S2 clear, no murmur rubs or gallops Chest: clear to auscultation bilaterally, no wheezing, rales or rhonchi Abdomen: soft nontender, nondistended, normal bowel sounds, no organomegaly Extremities: no cyanosis, clubbing or edema noted bilaterally Neuro: Cranial nerves II-XII intact, no focal neurological deficits  Data Reviewed: Basic Metabolic Panel:  Recent Labs Lab 07/11/12 1526 07/12/12 0032 07/12/12 0540 07/13/12 0500 07/14/12 0400  NA 123* 125* 128* 130* 129*  K 4.0 3.5 3.6 3.4* 4.1  CL 86* 91* 92* 102 101  CO2 21 22 24 23 21   GLUCOSE 149* 148* 85 81 110*  BUN 65* 58* 57* 34* 19  CREATININE 2.95* 2.33* 2.47* 1.77* 1.30*  CALCIUM 11.0* 9.6 9.6 9.1 8.9   Liver Function Tests:  Recent Labs Lab 07/11/12 1526 07/12/12 0540  AST 42* 30  ALT 41* 33  ALKPHOS 24* 22*  BILITOT 0.6 0.5  PROT 7.8 6.6  ALBUMIN 4.0 3.4*   No results found for this basename: LIPASE, AMYLASE,  in the last 168 hours  Recent Labs Lab 07/11/12 1538  AMMONIA 12   CBC:  Recent Labs Lab 07/11/12 1526 07/12/12 0540 07/13/12 0500 07/14/12 0400  WBC 5.2 5.5 3.3* 3.9*  NEUTROABS 3.7  --   --   --  HGB 12.7 11.2* 9.5* 8.6*  HCT 36.8 32.6* 27.4* 25.2*  MCV 82.1 82.5 82.8 82.4  PLT 198 191 172 168   Cardiac Enzymes: No results found for this basename: CKTOTAL, CKMB, CKMBINDEX, TROPONINI,  in the last 168 hours BNP (last 3 results)  Recent Labs  07/11/12 1526  PROBNP 299.3*   CBG:  Recent Labs Lab 07/13/12 0744 07/13/12 1225 07/13/12 1724 07/13/12 2153 07/14/12 0730  GLUCAP 76 32* 95 149* 103*     No results found for this or any previous visit (from the past 240 hour(s)).   Studies: No results found.  Scheduled Meds: . aspirin EC  325 mg Oral Daily  . ezetimibe  10 mg Oral Daily  . feeding supplement  237 mL Oral BID BM  . fenofibrate  160 mg Oral Daily  . heparin  5,000 Units Subcutaneous Q8H  . insulin aspart  0-15 Units Subcutaneous TID WC  . omega-3 acid ethyl esters  1 g Oral BID  . pantoprazole  40 mg Oral Daily  . sodium chloride  3 mL Intravenous Q12H   Continuous Infusions: . sodium chloride 75 mL/hr at 07/14/12 1610    Principal Problem:   AKI (acute kidney injury) Active Problems:   Dehydration   DM2 (diabetes mellitus, type 2)   HTN (hypertension)   Dementia   Hyponatremia   Malnutrition of moderate degree    Time spent: 35 minutes    [facility] A  [facility] Pager [phone]. If 7PM-7AM, please contact night-coverage at [email or web address], password [facility] 07/14/2012, 10:00 AM  LOS: 3 days

## 2012-07-15 LAB — BASIC METABOLIC PANEL
CO2: 19 mEq/L (ref 19–32)
Calcium: 9.2 mg/dL (ref 8.4–10.5)
Chloride: 102 mEq/L (ref 96–112)
GFR calc Af Amer: 48 mL/min — ABNORMAL LOW (ref >90)
Sodium: 130 mEq/L — ABNORMAL LOW (ref 135–145)

## 2012-07-15 LAB — GLUCOSE, CAPILLARY
Glucose-Capillary: 87 mg/dL (ref 70–99)
Glucose-Capillary: 98 mg/dL (ref 70–99)
Glucose-Capillary: 138 mg/dL — ABNORMAL HIGH (ref 70–99)

## 2012-07-15 MED ORDER — HYDROCODONE-ACETAMINOPHEN 5-325 MG PO TABS: 1.0000 | ORAL_TABLET | Freq: Four times a day (QID) | ORAL | Status: DC | PRN

## 2012-07-15 MED ORDER — LISINOPRIL 10 MG PO TABS: 10.0000 mg | ORAL_TABLET | Freq: Every day | ORAL | Status: DC

## 2012-07-15 NOTE — Progress Notes (Signed)
PT Cancellation Note  Patient Details Name: Paula [name] MRN: [id number] DOB: Jul 28, 1940   Cancelled Treatment:    Reason Eval/Treat Not Completed: Other (comment) (pt to d/c home this pm.)   [facility] 07/15/2012, 3:28 PM

## 2012-07-15 NOTE — Discharge Summary (Signed)
Physician Discharge Summary  Paula [name] OZH:[id number] DOB: [birth year] DOA: 07/11/2012  PCP: Paula Flake, MD  Admit date: 07/11/2012 Discharge date: 07/15/2012  Time spent: 40 minutes  Recommendations for Outpatient Follow-up:  1. Followup with primary care physician within one week  Discharge Diagnoses:  Principal Problem:   AKI (acute kidney injury) Active Problems:   Dehydration   DM2 (diabetes mellitus, type 2)   HTN (hypertension)   Dementia   Hyponatremia   Malnutrition of moderate degree   Discharge Condition: Stable  Diet recommendation: Carbohydrate modified diet.   Filed Weights   07/11/12 1950  Weight: 73.4 kg (161 lb 13.1 oz)    History of present illness:  Paula [name] is a 72 y.o. female with a hx of diabetes, htn, and dementia who presents to the ED after being found poorly responsive. The patient was noted to have decreased appetite and decreased PO intake several weeks leading up to the presenting event. Prior to the arrival EMS, the pt's caregiver initiated CPR on the patient, resulting in the patient rapidly awakening. She was then transported to the ED where a Cr was noted to be just under 3 ( baseline of around 1.3). A serum sodium was noted to be 123. The patient was found to be initially hypotensive and aggressive fluids were initiated. The hospitalist service was consulted for possible admission.  Hospital Course:   1. Acute renal failure: likely secondary to dehydration from poor oral intake. Patient presented to the hospital his creatinine of 2.9, her baseline creatinine is 1.3. Hydration with IV fluids started, patient creatinine improved very well. It's resolved that with creatinine today at baseline of 1.3. She  Is been also on lisinopril 40 mg, because of her CKD I decreased the dose to 10 mg by mouth daily.  2. Hypoglycemia: This is likely secondary to combination of decreased clearance of Lantus insulin secondary to the renal  failure and poor oral intake. Lantus insulin discontinued. Currently blood sugar mentating without any support from IV fluids.  3. Diabetes mellitus type 2: Patient is on 5 units of Lantus insulin, this is discontinued. While patient in the hospital SSI was started. Recommend on discharge carbohydrate modified diet. I spoke with her daughter and I asked her take her CBGs checks to her primary care physician for the next visit. Patient can be started on Tradjenta if needed , this is an oral hypoglycemic is not affected by kidney function.  4. Hyponatremia: Likely secondary to dehydration, it seems that patient has chronic hyponatremia which is stable. Baseline sodium level is 130.   5. Hypertension: Upon admission to the hospital patient will hypotensive which is thought to be secondary to dehydration/volume depletion hold for blood pressure medication was discontinued including lisinopril to 40 mg. Because of patient has chronic kidney disease with baseline creatinine of 1.3 lisinopril decreased to 10 mg. Patient did not have any evidence of infection going on, no evidence of UTI, pneumonia or skin infection as well as sepsis.  Procedures:   none   Consultations:  None   Discharge Exam: Filed Vitals:   07/13/12 2154 07/14/12 0528 07/14/12 2036 07/15/12 0448  BP: 132/73 118/77 124/77 120/72  Pulse: 67 60 61 65  Temp: 98.3 F (36.8 C) 98.5 F (36.9 C) 99.4 F (37.4 C) 99.1 F (37.3 C)  TempSrc: Oral Oral Oral Oral  Resp: 18 20 18 18   Height:      Weight:      SpO2: 100% 100% 100% 99%  General: Alert and awake, oriented x3, not in any acute distress. HEENT: anicteric sclera, pupils reactive to light and accommodation, EOMI CVS: S1-S2 clear, no murmur rubs or gallops Chest: clear to auscultation bilaterally, no wheezing, rales or rhonchi Abdomen: soft nontender, nondistended, normal bowel sounds, no organomegaly Extremities: no cyanosis, clubbing or edema noted bilaterally Neuro:  Cranial nerves II-XII intact, no focal neurological deficits  Discharge Instructions  Discharge Orders   Future Orders Complete By Expires     Diet - low sodium heart healthy  As directed     Increase activity slowly  As directed         Medication List    STOP taking these medications       insulin glargine 100 UNIT/ML injection  Commonly known as:  LANTUS     NOVOLOG PENFILL 100 UNIT/ML Soct cartridge  Generic drug:  insulin aspart      TAKE these medications       alendronate 70 MG tablet  Commonly known as:  FOSAMAX  Take 1 tablet (70 mg total) by mouth every 7 (seven) days. Take with a full glass of water on an empty stomach. On Wednesdays     B-D ULTRAFINE III SHORT PEN 31G X 8 MM Misc  Generic drug:  Insulin Pen Needle  USE 1 PEN NEEDLE 2 TIMES A DAY AS NEEDED     chlorthalidone 25 MG tablet  Commonly known as:  HYGROTON  Take 50 mg by mouth daily.     ezetimibe 10 MG tablet  Commonly known as:  ZETIA  Take 10 mg by mouth daily.     fenofibrate 160 MG tablet  Take 160 mg by mouth daily.     Fish Oil 1200 MG Caps  Take 1 capsule by mouth daily.     furosemide 20 MG tablet  Commonly known as:  LASIX  Take 20 mg by mouth daily.     HYDROcodone-acetaminophen 5-325 MG per tablet  Commonly known as:  NORCO/VICODIN  Take 1 tablet by mouth every 6 (six) hours as needed for pain.     isosorbide mononitrate 30 MG 24 hr tablet  Commonly known as:  IMDUR  Take 1 tablet (30 mg total) by mouth daily.     lisinopril 10 MG tablet  Commonly known as:  PRINIVIL,ZESTRIL  Take 1 tablet (10 mg total) by mouth daily.     loratadine 10 MG tablet  Commonly known as:  CLARITIN  Take 10 mg by mouth daily.     pantoprazole 40 MG tablet  Commonly known as:  PROTONIX  Take 40 mg by mouth daily.     potassium chloride SA 20 MEQ tablet  Commonly known as:  K-DUR,KLOR-CON  Take 20 mEq by mouth daily.     traMADol 50 MG tablet  Commonly known as:  ULTRAM  Take 1  tablet (50 mg total) by mouth every 6 (six) hours as needed. For pain       No Known Allergies     Follow-up Information   Follow up with Paula Flake, MD In 1 week.   Contact information:   [address] [place] [address] [phone]        The results of significant diagnostics from this hospitalization (including imaging, microbiology, ancillary and laboratory) are listed below for reference.    Significant Diagnostic Studies: Ct Head Wo Contrast  07/11/2012   *RADIOLOGY REPORT*  Clinical Data: Loss of consciousness.  Dementia  CT HEAD WITHOUT CONTRAST  Technique:  Contiguous axial images were obtained from the base of the skull through the vertex without contrast.  Comparison: 05/27/2012  Findings: Generalized atrophy is unchanged.  Chronic microvascular ischemia in the white matter, left greater than right is unchanged. 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  Negative for acute infarct.  Negative for hemorrhage or mass.  No acute skull abnormality.  IMPRESSION: Atrophy and chronic microvascular ischemia.  No acute abnormality.   Original Report Authenticated By: Janeece Riggers, M.D.   Dg Chest Portable 1 View  07/11/2012   *RADIOLOGY REPORT*  Clinical Data: 72 year old female with syncope hypotension.  PORTABLE CHEST - 1 VIEW  Comparison: 05/27/2012 and earlier.  Findings: AP portable view at 1647 hours.  Stable left chest cardiac AICD.  Cardiac size and mediastinal contours are within normal limits.  Allowing for portable technique, lung volumes are normal and the lungs are clear.  No pneumothorax or effusion.  IMPRESSION: No acute cardiopulmonary abnormality.   Original Report Authenticated By: Erskine Speed, M.D.    Microbiology: No results found for this or any previous visit (from the past 240 hour(s)).   Labs: Basic Metabolic Panel:  Recent Labs Lab 07/12/12 0032 07/12/12 0540 07/13/12 0500 07/14/12 0400 07/15/12 1135  NA 125* 128* 130* 129*  130*  K 3.5 3.6 3.4* 4.1 4.2  CL 91* 92* 102 101 102  CO2 22 24 23 21 19   GLUCOSE 148* 85 81 110* 100*  BUN 58* 57* 34* 19 11  CREATININE 2.33* 2.47* 1.77* 1.30* 1.27*  CALCIUM 9.6 9.6 9.1 8.9 9.2   Liver Function Tests:  Recent Labs Lab 07/11/12 1526 07/12/12 0540  AST 42* 30  ALT 41* 33  ALKPHOS 24* 22*  BILITOT 0.6 0.5  PROT 7.8 6.6  ALBUMIN 4.0 3.4*   No results found for this basename: LIPASE, AMYLASE,  in the last 168 hours  Recent Labs Lab 07/11/12 1538  AMMONIA 12   CBC:  Recent Labs Lab 07/11/12 1526 07/12/12 0540 07/13/12 0500 07/14/12 0400  WBC 5.2 5.5 3.3* 3.9*  NEUTROABS 3.7  --   --   --   HGB 12.7 11.2* 9.5* 8.6*  HCT 36.8 32.6* 27.4* 25.2*  MCV 82.1 82.5 82.8 82.4  PLT 198 191 172 168   Cardiac Enzymes: No results found for this basename: CKTOTAL, CKMB, CKMBINDEX, TROPONINI,  in the last 168 hours BNP: BNP (last 3 results)  Recent Labs  07/11/12 1526  PROBNP 299.3*   CBG:  Recent Labs Lab 07/14/12 1142 07/14/12 1706 07/14/12 2141 07/15/12 0727 07/15/12 1136  GLUCAP 114* 103* 147* 87 98       Signed:  Nanda Bittick A  [facility] 07/15/2012, 2:13 PM

## 2012-07-15 NOTE — Care Management Note (Addendum)
    Page 1 of 2   07/15/2012     2:53:44 PM   CARE MANAGEMENT NOTE 07/15/2012  Patient:  Paula [name], Paula [name]   Account Number:  [id number]  Date Initiated:  07/15/2012  Documentation initiated by:  Letha Cape  Subjective/Objective Assessment:   dx aki  admit- lives with daughter. Patient also has caps services Mon-Fri for 8hrs and Sat/Sun for 2 hrs.  Pt has a rolling walker.     Action/Plan:   pt eval- recs hhpt with 24 hr supr   Anticipated DC Date:  07/15/2012   Anticipated DC Plan:  HOME W HOME HEALTH SERVICES      DC Planning Services  CM consult      [facility]  HOME HEALTH   Choice offered to / List presented to:  C-4 Adult Children        HH arranged  HH-1 RN  HH-2 PT  HH-3 OT      [facility] agency  [facility]   Status of service:  Completed, signed off Medicare Important Message given?   (If response is "NO", the following Medicare IM given date fields will be blank) Date Medicare IM given:   Date Additional Medicare IM given:    Discharge Disposition:  HOME W HOME HEALTH SERVICES  Per UR Regulation:  Reviewed for med. necessity/level of care/duration of stay  If discussed at Long Length of Stay Meetings, dates discussed:    Comments:  07/15/12 12:12 Letha Cape RN, BSN [phone] patient lives with daughter at [address] Physical therapy recs hhpt with 24 hr care. Cathlean Cower, patient's daughter chose [facility], referral made , Lupita Leash notified for HHPT HHRN, and HHOT.  Soc will begin 24-48 hrs post discharge.   Patient is for discharge today.

## 2012-07-15 NOTE — Progress Notes (Signed)
NURSING PROGRESS NOTE  Paula [name] [id number] Discharge Data: 07/15/2012 6:06 PM Attending Provider: Clydia Llano, MD [id number] Judie Petit, MD     Holli Humbles to be D/C'd Home per MD order.  Discussed with the patient the After Visit Summary and all questions fully answered. All IV's discontinued with no bleeding noted. All belongings returned to patient for patient to take home.   Last Vital Signs:  Blood pressure 126/68, pulse 62, temperature 99.2 F (37.3 C), temperature source Oral, resp. rate 18, height 5\' 3"  (1.6 m), weight 73.4 kg (161 lb 13.1 oz), SpO2 98.00%.  Discharge Medication List   Medication List    STOP taking these medications       insulin glargine 100 UNIT/ML injection  Commonly known as:  LANTUS     NOVOLOG PENFILL 100 UNIT/ML Soct cartridge  Generic drug:  insulin aspart      TAKE these medications       alendronate 70 MG tablet  Commonly known as:  FOSAMAX  Take 1 tablet (70 mg total) by mouth every 7 (seven) days. Take with a full glass of water on an empty stomach. On Wednesdays     B-D ULTRAFINE III SHORT PEN 31G X 8 MM Misc  Generic drug:  Insulin Pen Needle  USE 1 PEN NEEDLE 2 TIMES A DAY AS NEEDED     chlorthalidone 25 MG tablet  Commonly known as:  HYGROTON  Take 50 mg by mouth daily.     ezetimibe 10 MG tablet  Commonly known as:  ZETIA  Take 10 mg by mouth daily.     fenofibrate 160 MG tablet  Take 160 mg by mouth daily.     Fish Oil 1200 MG Caps  Take 1 capsule by mouth daily.     furosemide 20 MG tablet  Commonly known as:  LASIX  Take 20 mg by mouth daily.     HYDROcodone-acetaminophen 5-325 MG per tablet  Commonly known as:  NORCO/VICODIN  Take 1 tablet by mouth every 6 (six) hours as needed for pain.     isosorbide mononitrate 30 MG 24 hr tablet  Commonly known as:  IMDUR  Take 1 tablet (30 mg total) by mouth daily.     lisinopril 10 MG tablet  Commonly known as:  PRINIVIL,ZESTRIL  Take 1 tablet (10 mg  total) by mouth daily.     loratadine 10 MG tablet  Commonly known as:  CLARITIN  Take 10 mg by mouth daily.     pantoprazole 40 MG tablet  Commonly known as:  PROTONIX  Take 40 mg by mouth daily.     potassium chloride SA 20 MEQ tablet  Commonly known as:  K-DUR,KLOR-CON  Take 20 mEq by mouth daily.     traMADol 50 MG tablet  Commonly known as:  ULTRAM  Take 1 tablet (50 mg total) by mouth every 6 (six) hours as needed. For pain

## 2012-07-15 NOTE — Progress Notes (Signed)
Utilization review completed.  

## 2012-07-16 ENCOUNTER — Telehealth: Payer: Self-pay | Admitting: Internal Medicine

## 2012-07-16 NOTE — Telephone Encounter (Signed)
Ok Thx 

## 2012-07-16 NOTE — Telephone Encounter (Signed)
Pt's daughter called [facility]) stated that she was here for an appt with Dr. Macario Golds and Dr. Macario Golds stated that he will take her mother Paula [name]) on as a new pt. Mrs. Parrillo has Medicare and Medicaid [employer]. Please advise.

## 2012-07-16 NOTE — Telephone Encounter (Signed)
Left vm for pt to callback 

## 2012-07-17 NOTE — Telephone Encounter (Signed)
Pt has an appt 07/25/12.

## 2012-07-25 ENCOUNTER — Ambulatory Visit: Payer: Medicare Other | Admitting: Internal Medicine

## 2012-07-25 DIAGNOSIS — Z0289 Encounter for other administrative examinations: Secondary | ICD-10-CM

## 2012-09-18 ENCOUNTER — Other Ambulatory Visit: Payer: Self-pay | Admitting: Internal Medicine

## 2012-09-25 DIAGNOSIS — I441 Atrioventricular block, second degree: Secondary | ICD-10-CM

## 2012-09-27 ENCOUNTER — Encounter: Payer: Self-pay | Admitting: *Deleted

## 2012-12-24 ENCOUNTER — Emergency Department (HOSPITAL_COMMUNITY): Payer: Medicare Other

## 2012-12-24 ENCOUNTER — Inpatient Hospital Stay (HOSPITAL_COMMUNITY)
Admission: EM | Admit: 2012-12-24 | Discharge: 2012-12-26 | DRG: 638 | Disposition: A | Discharge status: Home Health | Payer: Medicare Other | Attending: Internal Medicine | Admitting: Internal Medicine

## 2012-12-24 ENCOUNTER — Encounter (HOSPITAL_COMMUNITY): Payer: Self-pay | Admitting: Internal Medicine

## 2012-12-24 DIAGNOSIS — E1100 Type 2 diabetes mellitus with hyperosmolarity without nonketotic hyperglycemic-hyperosmolar coma (NKHHC): Principal | ICD-10-CM | POA: Diagnosis present

## 2012-12-24 DIAGNOSIS — Z794 Long term (current) use of insulin: Secondary | ICD-10-CM

## 2012-12-24 DIAGNOSIS — Z87891 Personal history of nicotine dependence: Secondary | ICD-10-CM

## 2012-12-24 DIAGNOSIS — F0390 Unspecified dementia without behavioral disturbance: Secondary | ICD-10-CM | POA: Diagnosis present

## 2012-12-24 DIAGNOSIS — I509 Heart failure, unspecified: Secondary | ICD-10-CM | POA: Diagnosis present

## 2012-12-24 DIAGNOSIS — Z91199 Patient's noncompliance with other medical treatment and regimen due to unspecified reason: Secondary | ICD-10-CM

## 2012-12-24 DIAGNOSIS — E860 Dehydration: Secondary | ICD-10-CM | POA: Diagnosis present

## 2012-12-24 DIAGNOSIS — I5022 Chronic systolic (congestive) heart failure: Secondary | ICD-10-CM | POA: Diagnosis present

## 2012-12-24 DIAGNOSIS — Z7982 Long term (current) use of aspirin: Secondary | ICD-10-CM

## 2012-12-24 DIAGNOSIS — I447 Left bundle-branch block, unspecified: Secondary | ICD-10-CM | POA: Diagnosis present

## 2012-12-24 DIAGNOSIS — E871 Hypo-osmolality and hyponatremia: Secondary | ICD-10-CM | POA: Diagnosis present

## 2012-12-24 DIAGNOSIS — I10 Essential (primary) hypertension: Secondary | ICD-10-CM | POA: Diagnosis present

## 2012-12-24 DIAGNOSIS — Z8673 Personal history of transient ischemic attack (TIA), and cerebral infarction without residual deficits: Secondary | ICD-10-CM

## 2012-12-24 DIAGNOSIS — R739 Hyperglycemia, unspecified: Secondary | ICD-10-CM

## 2012-12-24 DIAGNOSIS — I2589 Other forms of chronic ischemic heart disease: Secondary | ICD-10-CM | POA: Diagnosis present

## 2012-12-24 DIAGNOSIS — R079 Chest pain, unspecified: Secondary | ICD-10-CM | POA: Diagnosis present

## 2012-12-24 DIAGNOSIS — Z9119 Patient's noncompliance with other medical treatment and regimen: Secondary | ICD-10-CM

## 2012-12-24 DIAGNOSIS — Z79899 Other long term (current) drug therapy: Secondary | ICD-10-CM

## 2012-12-24 DIAGNOSIS — I2510 Atherosclerotic heart disease of native coronary artery without angina pectoris: Secondary | ICD-10-CM | POA: Diagnosis present

## 2012-12-24 DIAGNOSIS — Z87440 Personal history of urinary (tract) infections: Secondary | ICD-10-CM

## 2012-12-24 DIAGNOSIS — Z95810 Presence of automatic (implantable) cardiac defibrillator: Secondary | ICD-10-CM

## 2012-12-24 LAB — BASIC METABOLIC PANEL
BUN: 23 mg/dL (ref 6–23)
CO2: 24 mEq/L (ref 19–32)
Calcium: 11.3 mg/dL — ABNORMAL HIGH (ref 8.4–10.5)
Chloride: 92 mEq/L — ABNORMAL LOW (ref 96–112)
Creatinine, Ser: 1.13 mg/dL — ABNORMAL HIGH (ref 0.50–1.10)
GFR calc Af Amer: 50 mL/min — ABNORMAL LOW (ref >90)
GFR calc Af Amer: 55 mL/min — ABNORMAL LOW (ref >90)
GFR calc non Af Amer: 43 mL/min — ABNORMAL LOW (ref >90)
GFR calc non Af Amer: 47 mL/min — ABNORMAL LOW (ref >90)
Glucose, Bld: 510 mg/dL — ABNORMAL HIGH (ref 70–99)
Potassium: 3.7 mEq/L (ref 3.5–5.1)
Potassium: 3.3 mEq/L — ABNORMAL LOW (ref 3.5–5.1)
Sodium: 127 mEq/L — ABNORMAL LOW (ref 135–145)

## 2012-12-24 LAB — URINALYSIS, ROUTINE W REFLEX MICROSCOPIC
Bilirubin Urine: NEGATIVE
Glucose, UA: >1000 mg/dL — AB
Ketones, ur: NEGATIVE mg/dL
Leukocytes, UA: NEGATIVE
Nitrite: NEGATIVE
Specific Gravity, Urine: 1.028 (ref 1.005–1.030)
pH: 5 (ref 5.0–8.0)

## 2012-12-24 LAB — TROPONIN I: Troponin I: <0.3 ng/mL (ref <0.30)

## 2012-12-24 LAB — CBC
Hemoglobin: 14.5 g/dL (ref 12.0–15.0)
MCHC: 34 g/dL (ref 30.0–36.0)
Platelets: 228 10*3/uL (ref 150–400)

## 2012-12-24 LAB — POCT I-STAT TROPONIN I: Troponin i, poc: 0.08 ng/mL (ref 0.00–0.08)

## 2012-12-24 LAB — URINE MICROSCOPIC-ADD ON

## 2012-12-24 LAB — GLUCOSE, CAPILLARY: Glucose-Capillary: 369 mg/dL — ABNORMAL HIGH (ref 70–99)

## 2012-12-24 MED ORDER — INSULIN ASPART 100 UNIT/ML ~~LOC~~ SOLN
10.0000 [IU] | Freq: Once | SUBCUTANEOUS | Status: DC
  Filled 2012-12-24: qty 1

## 2012-12-24 MED ORDER — ACETAMINOPHEN 325 MG PO TABS: 650.0000 mg | ORAL_TABLET | Freq: Four times a day (QID) | ORAL | Status: DC | PRN

## 2012-12-24 MED ORDER — GLUCAGON HCL (RDNA) 1 MG IJ SOLR: 1.0000 mg | Freq: Once | INTRAMUSCULAR | Status: AC | PRN

## 2012-12-24 MED ORDER — ONDANSETRON HCL 4 MG/2ML IJ SOLN: 4.0000 mg | Freq: Four times a day (QID) | INTRAMUSCULAR | Status: DC | PRN

## 2012-12-24 MED ORDER — ACETAMINOPHEN 650 MG RE SUPP: 650.0000 mg | Freq: Four times a day (QID) | RECTAL | Status: DC | PRN

## 2012-12-24 MED ORDER — ASPIRIN 325 MG PO TABS
325.0000 mg | ORAL_TABLET | Freq: Once | ORAL | Status: AC
Start: 2012-12-24 — End: 2012-12-24
  Administered 2012-12-24: 325 mg via ORAL
  Filled 2012-12-24: qty 1

## 2012-12-24 MED ORDER — CEPHALEXIN 500 MG PO CAPS
500.0000 mg | ORAL_CAPSULE | Freq: Two times a day (BID) | ORAL | Status: DC
  Administered 2012-12-24 – 2012-12-26 (×4): 500 mg via ORAL
  Filled 2012-12-24 (×5): qty 1

## 2012-12-24 MED ORDER — INSULIN ASPART 100 UNIT/ML ~~LOC~~ SOLN
0.0000 [IU] | Freq: Three times a day (TID) | SUBCUTANEOUS | Status: DC
  Administered 2012-12-25: 11 [IU] via SUBCUTANEOUS
  Administered 2012-12-25: 15 [IU] via SUBCUTANEOUS
  Administered 2012-12-25: 8 [IU] via SUBCUTANEOUS
  Administered 2012-12-26: 5 [IU] via SUBCUTANEOUS
  Administered 2012-12-26: 8 [IU] via SUBCUTANEOUS

## 2012-12-24 MED ORDER — POTASSIUM CHLORIDE CRYS ER 20 MEQ PO TBCR
20.0000 meq | EXTENDED_RELEASE_TABLET | Freq: Once | ORAL | Status: AC
  Administered 2012-12-24: 20 meq via ORAL
  Filled 2012-12-24: qty 1

## 2012-12-24 MED ORDER — ASPIRIN 325 MG PO TABS
325.0000 mg | ORAL_TABLET | Freq: Every day | ORAL | Status: DC
  Administered 2012-12-25 – 2012-12-26 (×2): 325 mg via ORAL
  Filled 2012-12-24 (×2): qty 1

## 2012-12-24 MED ORDER — CELECOXIB 200 MG PO CAPS
200.0000 mg | ORAL_CAPSULE | Freq: Two times a day (BID) | ORAL | Status: DC
  Administered 2012-12-24 – 2012-12-26 (×4): 200 mg via ORAL
  Filled 2012-12-24 (×5): qty 1

## 2012-12-24 MED ORDER — DEXTROSE 50 % IV SOLN: 25.0000 mL | INTRAVENOUS | Status: DC | PRN

## 2012-12-24 MED ORDER — GLUCOSE 40 % PO GEL: 1.0000 | ORAL | Status: DC | PRN

## 2012-12-24 MED ORDER — SODIUM CHLORIDE 0.9 % IV SOLN: INTRAVENOUS | Status: DC

## 2012-12-24 MED ORDER — PROPRANOLOL HCL 10 MG PO TABS: 10.0000 mg | ORAL_TABLET | Freq: Two times a day (BID) | ORAL | Status: DC

## 2012-12-24 MED ORDER — SODIUM CHLORIDE 0.9 % IJ SOLN: 3.0000 mL | Freq: Two times a day (BID) | INTRAMUSCULAR | Status: DC

## 2012-12-24 MED ORDER — GLUCAGON HCL (RDNA) 1 MG IJ SOLR: 0.5000 mg | Freq: Once | INTRAMUSCULAR | Status: AC | PRN

## 2012-12-24 MED ORDER — TRAMADOL HCL 50 MG PO TABS
50.0000 mg | ORAL_TABLET | Freq: Four times a day (QID) | ORAL | Status: DC | PRN
  Filled 2012-12-24: qty 1

## 2012-12-24 MED ORDER — INSULIN ASPART 100 UNIT/ML ~~LOC~~ SOLN: 0.0000 [IU] | Freq: Every day | SUBCUTANEOUS | Status: DC

## 2012-12-24 MED ORDER — SODIUM CHLORIDE 0.9 % IV SOLN
INTRAVENOUS | Status: DC
  Filled 2012-12-24: qty 1

## 2012-12-24 MED ORDER — ENOXAPARIN SODIUM 40 MG/0.4ML ~~LOC~~ SOLN
40.0000 mg | SUBCUTANEOUS | Status: DC
  Administered 2012-12-24 – 2012-12-25 (×2): 40 mg via SUBCUTANEOUS
  Filled 2012-12-24 (×3): qty 0.4

## 2012-12-24 MED ORDER — LISINOPRIL 20 MG PO TABS
20.0000 mg | ORAL_TABLET | Freq: Every day | ORAL | Status: DC
  Administered 2012-12-25 – 2012-12-26 (×2): 20 mg via ORAL
  Filled 2012-12-24 (×3): qty 1

## 2012-12-24 MED ORDER — ATORVASTATIN CALCIUM 40 MG PO TABS
40.0000 mg | ORAL_TABLET | Freq: Every day | ORAL | Status: DC
  Administered 2012-12-25 – 2012-12-26 (×2): 40 mg via ORAL
  Filled 2012-12-24 (×2): qty 1

## 2012-12-24 MED ORDER — DEXTROSE-NACL 5-0.45 % IV SOLN: INTRAVENOUS | Status: DC

## 2012-12-24 MED ORDER — INSULIN REGULAR BOLUS VIA INFUSION
0.0000 [IU] | Freq: Three times a day (TID) | INTRAVENOUS | Status: DC
  Filled 2012-12-24: qty 10

## 2012-12-24 MED ORDER — ALBUTEROL SULFATE (5 MG/ML) 0.5% IN NEBU: 2.5000 mg | INHALATION_SOLUTION | RESPIRATORY_TRACT | Status: DC | PRN

## 2012-12-24 MED ORDER — PANTOPRAZOLE SODIUM 40 MG PO TBEC
40.0000 mg | DELAYED_RELEASE_TABLET | Freq: Every day | ORAL | Status: DC
  Administered 2012-12-24 – 2012-12-26 (×3): 40 mg via ORAL
  Filled 2012-12-24 (×3): qty 1

## 2012-12-24 MED ORDER — ONDANSETRON HCL 4 MG PO TABS: 4.0000 mg | ORAL_TABLET | Freq: Four times a day (QID) | ORAL | Status: DC | PRN

## 2012-12-24 MED ORDER — SODIUM CHLORIDE 0.9 % IV BOLUS (SEPSIS): 1000.0000 mL | Freq: Once | INTRAVENOUS | Status: DC

## 2012-12-24 MED ORDER — INSULIN ASPART 100 UNIT/ML ~~LOC~~ SOLN
15.0000 [IU] | Freq: Once | SUBCUTANEOUS | Status: AC
  Administered 2012-12-24: 15 [IU] via SUBCUTANEOUS
  Filled 2012-12-24: qty 1

## 2012-12-24 MED ORDER — INSULIN GLARGINE 100 UNIT/ML ~~LOC~~ SOLN
10.0000 [IU] | Freq: Every day | SUBCUTANEOUS | Status: DC
  Administered 2012-12-24: 10 [IU] via SUBCUTANEOUS
  Filled 2012-12-24 (×2): qty 0.1

## 2012-12-24 NOTE — H&P (Signed)
[facility]  History and Physical  NOGA FOGG WUJ:[id number] DOB: [birth year] DOA: 12/24/2012  Referring physician: EDP PCP: [name], MD  Outpatient Specialists:  1. Cardiology: Dr. Christiane Ha Berry/Dr. Rachelle Hora Croitoru  Chief Complaint: Chest pain & weakness.  HPI: Paula [name] is a 72 y.o. female with history of type II DM/IDDM, severe hypertension, dementia, severe ischemic cardiomyopathy, chronic systolic CHF, status post dual-chamber ICD implantation, was referred from urgent care for evaluation and management of significant hyperglycemia and EKG changes. Patient and family are poor historians. As per patient, she started experiencing left-sided chest pain on Sunday night which was made worse by lying on that side. She denies any associated radiation, fever, cough, dyspnea or palpitations. She had this pain intermittently through Tuesday when she went to the urgent care Center and was told to have infection around the ICD chamber site and and UTI. She was started on antibiotics for same and her insulins were adjusted and she was asked to return today for evaluation. Patient states her pain resolved yesterday. When she went for followup today, she was told to have abnormal EKG and hyperglycemia and was transferred to [facility] ED for evaluation. Patient currently denies any chest pain. She does complain of some weakness. She claims compliance with her diabetic medications but does eat a spoon of peanut butter every night. She denies dysuria or urinary frequency. She denies fever, chills or redness at ICD box site. She complains of thirst. In the ED, sodium 127, chloride 90, glucose 510, BUN 25, creatinine 1.22, calcium 11.3, chest x-ray without acute findings. Hospitalist admission requested.   Review of Systems: All systems reviewed and apart from history of presenting illness, are negative.  Past Medical History  Diagnosis Date  . Angina   . Shortness of  breath   . DEMENTIA   . Hypertension   . Stroke   . Headache(784.0)   . Diabetes mellitus   . Dementia 05/27/2012   Past Surgical History  Procedure Laterality Date  . Insert / replace / remove pacemaker  12/15/2010  . Back surgery    . Abdominal hysterectomy     Social History:  reports that she has quit smoking. Her smoking use included Cigarettes. She smoked 0.00 packs per day. She has never used smokeless tobacco. She reports that she does not drink alcohol or use illicit drugs. Patient is independent of activities of daily living and lives with her daughter.  No Known Allergies  No family history on file. negative family history.  Prior to Admission medications   Medication Sig Start Date End Date Taking? Authorizing Provider  alendronate (FOSAMAX) 70 MG tablet Take 70 mg by mouth once a week. Take with a full glass of water on an empty stomach. On wednesdays   Yes Historical Provider, MD  aspirin 325 MG tablet Take 325 mg by mouth daily.   Yes Historical Provider, MD  atorvastatin (LIPITOR) 40 MG tablet Take 40 mg by mouth daily.   Yes Historical Provider, MD  celecoxib (CELEBREX) 200 MG capsule Take 200 mg by mouth 2 (two) times daily.   Yes Historical Provider, MD  insulin glargine (LANTUS) 100 units/mL SOLN Inject 10 Units into the skin at bedtime.   Yes Historical Provider, MD  lisinopril (PRINIVIL,ZESTRIL) 20 MG tablet Take 20 mg by mouth daily.   Yes Historical Provider, MD  propranolol (INDERAL) 10 MG tablet Take 10 mg by mouth.   Yes Historical Provider, MD  cephALEXin (KEFLEX) 500 MG capsule  Take 500 mg by mouth 2 (two) times daily. 12/22/12   Historical Provider, MD  chlorthalidone (HYGROTON) 25 MG tablet Take 25 mg by mouth daily.     Historical Provider, MD  fenofibrate 160 MG tablet Take 160 mg by mouth daily.    Historical Provider, MD  furosemide (LASIX) 20 MG tablet Take 20 mg by mouth daily.    Historical Provider, MD  Insulin Detemir (LEVEMIR FLEXTOUCH) 100  UNIT/ML SOPN Inject 15 Units into the skin.    Historical Provider, MD  isosorbide mononitrate (IMDUR) 60 MG 24 hr tablet Take 60 mg by mouth daily.    Historical Provider, MD  loratadine (CLARITIN) 10 MG tablet Take 10 mg by mouth daily.      Historical Provider, MD  Omega-3 Fatty Acids (FISH OIL) 1200 MG CAPS Take 1 capsule by mouth daily.    Historical Provider, MD  pantoprazole (PROTONIX) 40 MG tablet Take 40 mg by mouth daily.    Historical Provider, MD  potassium chloride SA (K-DUR,KLOR-CON) 20 MEQ tablet Take 20 mEq by mouth daily.    Historical Provider, MD  traMADol (ULTRAM) 50 MG tablet Take 1 tablet (50 mg total) by mouth every 6 (six) hours as needed. For pain 07/10/12   Kermit Balo, DO   Physical Exam: Filed Vitals:   12/24/12 1417 12/24/12 1605 12/24/12 1659  BP: 114/71 131/67 109/64  Temp: 98 F (36.7 C)    TempSrc: Oral    Resp: 18 16 20   SpO2: 98% 97% 96%     General exam: Moderately built and nourished female patient, lying comfortably supine on the gurney in no obvious distress.  Head, eyes and ENT: Nontraumatic and normocephalic. Pupils equally reacting to light and accommodation. Oral mucosa dry.  Neck: Supple. No JVD, carotid bruit or thyromegaly.  Lymphatics: No lymphadenopathy.  Respiratory system: Clear to auscultation. No increased work of breathing. ICD box site without acute findings. No chest wall tenderness.  Cardiovascular system: S1 and S2 heard, RRR. No JVD, murmurs, gallops, clicks or pedal edema.  Gastrointestinal system: Abdomen is nondistended, soft .?? Mild tenderness left mid quadrant but without rigidity, guarding or rebound. Normal bowel sounds heard. No organomegaly or masses appreciated.  Central nervous system: Alert and oriented. No focal neurological deficits.  Extremities: Symmetric 5 x 5 power. Peripheral pulses symmetrically felt.   Skin: No rashes or acute findings.  Musculoskeletal system: Negative exam.  Psychiatry:  Pleasant and cooperative.   Labs on Admission:  Basic Metabolic Panel:  Recent Labs Lab 12/24/12 1441  NA 127*  K 3.7  CL 90*  CO2 23  GLUCOSE 510*  BUN 25*  CREATININE 1.22*  CALCIUM 11.3*   Liver Function Tests: No results found for this basename: AST, ALT, ALKPHOS, BILITOT, PROT, ALBUMIN,  in the last 168 hours No results found for this basename: LIPASE, AMYLASE,  in the last 168 hours No results found for this basename: AMMONIA,  in the last 168 hours CBC:  Recent Labs Lab 12/24/12 1441  WBC 5.3  HGB 14.5  HCT 42.6  MCV 87.1  PLT 228   Cardiac Enzymes: No results found for this basename: CKTOTAL, CKMB, CKMBINDEX, TROPONINI,  in the last 168 hours  BNP (last 3 results)  Recent Labs  07/11/12 1526  PROBNP 299.3*   CBG:  Recent Labs Lab 12/24/12 1804  GLUCAP 369*    Radiological Exams on Admission: Dg Chest 1 View  12/24/2012   CLINICAL DATA:  Chest pain  EXAM: CHEST -  1 VIEW  COMPARISON:  07/11/2012  FINDINGS: Cardiomediastinal silhouette is stable. Dual lead cardiac pacemaker is unchanged in position. No segmental infiltrate or pulmonary edema. Mild basilar atelectasis.  IMPRESSION: No acute infiltrate or pulmonary edema. Dual lead cardiac pacemaker is unchanged in position. Mild basilar atelectasis.   Electronically Signed   By: Natasha Mead M.D.   On: 12/24/2012 15:32    EKG: Independently reviewed. Sinus rhythm, LAD, LBBB (old) in no acute findings.  Assessment/Plan Principal Problem:   DM hyperosmolarity type II, uncontrolled Active Problems:   Dehydration   HTN (hypertension)   Dementia   Hyponatremia   Chest pain   Uncontrolled type II DM with hyperosmolar state - Patient claims compliance to diet and medications-however cannot be confirmed.  - Admit to step down unit.   - difficulty obtaining IV line in ED: EDP attempting same under ultrasound guidance. - Patient has thus far not received any Insulins or IV fluids and her CBGs are down  to 360 mg per DL range-however not sure if this is true. Will repeat BMP.  - In the interim, plan is for IV fluids and IV insulin drip until glycemic control is better.  - Check hemoglobin A1c.  - Pseudohyponatremia as related to dehydration and hyperglycemia   Atypical chest pain - Resolved. - Cycle cardiac enzymes. Continue aspirin and propranolol. - Unclear as to what medication she is truly taking at home. Await pharmacy to complete med rec. - Consider consulting her primary cardiology service in the morning. - ICD box does not clinically look infected.  Hypertension - Controlled. Continue lisinopril and propranolol  Dehydration - Brief IV fluids and monitor closely.  History of severe ischemic cardiomyopathy/ICD/chronic systolic CHF - 2-D echo in 2010 showed EF of 20-25%. Will repeat echo. Consider discussing with or consulting her primary cardiologist in a.m. Monitor on telemetry. - Clinically dry. For diuretics temporarily  History of recent UTI - Continue Keflex. Check urine microscopy.  Dementia     Code Status:  full   Family Communication:  discussed with her daughter at bedside and the next daughter via phone   Disposition Plan: home when medically stable    Time spent:  65 minutes   Demarie Uhlig, MD, FACP, FHM. [facility] Pager [phone]  If 7PM-7AM, please contact night-coverage [email or web address] Password [facility] 12/24/2012, 6:13 PM

## 2012-12-24 NOTE — ED Notes (Signed)
Pt brought here by EMS from UC for abnormal ekg.  Denies chest pain.  States she went to the UC b/c her doctor told her to go there, but that is all she can say.  Pt very poor historian d/t hx of dementia. Denies sob or chest pain at this time, just states she feels week and thinks her sugars are high.

## 2012-12-24 NOTE — Progress Notes (Signed)
Pt transferred from the ED around 1900, admitted to Rm/3s01. Pt comes from home with daughter and home health. She is alert and oriented but forgetful. Ambulatory with standby assist. No skin breakdown noted. Placed on telemetry, pt does have a pacer. Oriented to room, instructed to call for assistance before getting out of bed. Resting comfortably at this time with daughter at bedside, will continue to monitor

## 2012-12-24 NOTE — ED Notes (Signed)
Unable to find IV access.  Report given.  Dr Bennie Pierini stated to give SQ insulin.  Resident states he will attempt [place] guided line.  IV team paged.

## 2012-12-24 NOTE — ED Provider Notes (Signed)
CSN: [id number]     Arrival date & time 12/24/12  1353 History   First MD Initiated Contact with Patient 12/24/12 1431     Chief Complaint  Patient presents with  . abnormal ekg   . Hyperglycemia   (Consider location/radiation/quality/duration/timing/severity/associated sxs/prior Treatment) HPI Comments: Sent from clinic for chest pain, EKG changes.  Patient is a 72 y.o. female presenting with chest pain. The history is provided by the patient.  Chest Pain Pain location:  Substernal area Pain quality: tightness   Pain quality comment:  Twisting Pain radiates to:  Upper back Pain radiates to the back: yes   Pain severity:  Moderate Onset quality:  Sudden Duration:  2 days Timing:  Intermittent Progression:  Unchanged Chronicity:  New Context: at rest   Associated symptoms: no abdominal pain, no cough, no fever, no shortness of breath and not vomiting     Past Medical History  Diagnosis Date  . Angina   . Shortness of breath   . DEMENTIA   . Hypertension   . Stroke   . Headache(784.0)   . Diabetes mellitus   . Dementia 05/27/2012   Past Surgical History  Procedure Laterality Date  . Insert / replace / remove pacemaker  12/15/2010  . Back surgery    . Abdominal hysterectomy     No family history on file. History  Substance Use Topics  . Smoking status: Former Smoker    Types: Cigarettes  . Smokeless tobacco: Never Used  . Alcohol Use: No   OB History   Grav Para Term Preterm Abortions TAB SAB Ect Mult Living                 Review of Systems  Constitutional: Negative for fever.  Respiratory: Negative for cough and shortness of breath.   Cardiovascular: Positive for chest pain.  Gastrointestinal: Negative for vomiting and abdominal pain.  All other systems reviewed and are negative.    Allergies  Review of patient's allergies indicates no known allergies.  Home Medications   Current Outpatient Rx  Name  Route  Sig  Dispense  Refill  . alendronate  (FOSAMAX) 70 MG tablet   Oral   Take 70 mg by mouth once a week. Take with a full glass of water on an empty stomach. On wednesdays         . aspirin 325 MG tablet   Oral   Take 325 mg by mouth daily.         Marland Kitchen atorvastatin (LIPITOR) 40 MG tablet   Oral   Take 40 mg by mouth daily.         . celecoxib (CELEBREX) 200 MG capsule   Oral   Take 200 mg by mouth 2 (two) times daily.         . insulin glargine (LANTUS) 100 units/mL SOLN   Subcutaneous   Inject 10 Units into the skin at bedtime.         Marland Kitchen lisinopril (PRINIVIL,ZESTRIL) 20 MG tablet   Oral   Take 20 mg by mouth daily.         . propranolol (INDERAL) 10 MG tablet   Oral   Take 10 mg by mouth.         . cephALEXin (KEFLEX) 500 MG capsule   Oral   Take 500 mg by mouth 2 (two) times daily.         . chlorthalidone (HYGROTON) 25 MG tablet   Oral   Take 25  mg by mouth daily.          . fenofibrate 160 MG tablet   Oral   Take 160 mg by mouth daily.         . furosemide (LASIX) 20 MG tablet   Oral   Take 20 mg by mouth daily.         . Insulin Detemir (LEVEMIR FLEXTOUCH) 100 UNIT/ML SOPN   Subcutaneous   Inject 15 Units into the skin.         Marland Kitchen isosorbide mononitrate (IMDUR) 60 MG 24 hr tablet   Oral   Take 60 mg by mouth daily.         Marland Kitchen loratadine (CLARITIN) 10 MG tablet   Oral   Take 10 mg by mouth daily.           . Omega-3 Fatty Acids (FISH OIL) 1200 MG CAPS   Oral   Take 1 capsule by mouth daily.         . pantoprazole (PROTONIX) 40 MG tablet   Oral   Take 40 mg by mouth daily.         . potassium chloride SA (K-DUR,KLOR-CON) 20 MEQ tablet   Oral   Take 20 mEq by mouth daily.         . traMADol (ULTRAM) 50 MG tablet   Oral   Take 1 tablet (50 mg total) by mouth every 6 (six) hours as needed. For pain   120 tablet   3    BP 131/67  Temp(Src) 98 F (36.7 C) (Oral)  Resp 16  SpO2 97% Physical Exam  Nursing note and vitals reviewed. Constitutional:  She is oriented to person, place, and time. She appears well-developed and well-nourished. No distress.  HENT:  Head: Normocephalic and atraumatic.  Eyes: EOM are normal. Pupils are equal, round, and reactive to light.  Neck: Normal range of motion. Neck supple.  Cardiovascular: Normal rate and regular rhythm.  Exam reveals no friction rub.   No murmur heard. Pulmonary/Chest: Effort normal and breath sounds normal. No respiratory distress. She has no wheezes. She has no rales.  Abdominal: Soft. She exhibits no distension. There is no tenderness. There is no rebound.  Musculoskeletal: Normal range of motion. She exhibits no edema.  Neurological: She is alert and oriented to person, place, and time. No cranial nerve deficit. She exhibits normal muscle tone.  Skin: No rash noted. She is not diaphoretic.    ED Course  Procedures (including critical care time) Labs Review Labs Reviewed  BASIC METABOLIC PANEL - Abnormal; Notable for the following:    Sodium 127 (*)    Chloride 90 (*)    Glucose, Bld 510 (*)    BUN 25 (*)    Creatinine, Ser 1.22 (*)    Calcium 11.3 (*)    GFR calc non Af Amer 43 (*)    GFR calc Af Amer 50 (*)    All other components within normal limits  CBC  POCT I-STAT TROPONIN I   Imaging Review Dg Chest 1 View  12/24/2012   CLINICAL DATA:  Chest pain  EXAM: CHEST - 1 VIEW  COMPARISON:  07/11/2012  FINDINGS: Cardiomediastinal silhouette is stable. Dual lead cardiac pacemaker is unchanged in position. No segmental infiltrate or pulmonary edema. Mild basilar atelectasis.  IMPRESSION: No acute infiltrate or pulmonary edema. Dual lead cardiac pacemaker is unchanged in position. Mild basilar atelectasis.   Electronically Signed   By: Lanette Hampshire.D.  On: 12/24/2012 15:32    EKG Interpretation    Date/Time:  Wednesday December 24 2012 14:20:42 EST Ventricular Rate:  95 PR Interval:  194 QRS Duration: 138 QT Interval:  418 QTC Calculation: 525 R Axis:   -60 Text  Interpretation:  Sinus rhythm Atrial premature complexes Probable left atrial enlargement Left bundle branch block Confirmed by [name]  MD, Veronia Laprise ([id number]) on 12/24/2012 3:14:52 PM            MDM   1. Hyperglycemia   2. Chest pain    24F here with twisting chest pain that radiates to the back. Gone on my exam, had had some previous to my arrival in her room. Sent from clinic for EKG changes. Hx of dementia, CAD, pacer, DM. Also hyperglycemic. States compliance with insulin. Here troponin with LBBB, similar to previous. Patient has no acute ischemic findings. Hyperglycemic to 510, anion gap 14. Patient given IV insulin and fluids, no insulin gtt initiated. Admitted to medicine to stepdown for insulin gtt.   Dagmar Hait, MD 12/24/12 [id number]

## 2012-12-25 DIAGNOSIS — R7309 Other abnormal glucose: Secondary | ICD-10-CM

## 2012-12-25 LAB — BASIC METABOLIC PANEL
BUN: 23 mg/dL (ref 6–23)
CO2: 22 mEq/L (ref 19–32)
Chloride: 94 mEq/L — ABNORMAL LOW (ref 96–112)
Creatinine, Ser: 1.15 mg/dL — ABNORMAL HIGH (ref 0.50–1.10)
GFR calc Af Amer: 54 mL/min — ABNORMAL LOW (ref >90)
GFR calc non Af Amer: 46 mL/min — ABNORMAL LOW (ref >90)
Potassium: 3.8 mEq/L (ref 3.5–5.1)
Sodium: 130 mEq/L — ABNORMAL LOW (ref 135–145)

## 2012-12-25 LAB — GLUCOSE, CAPILLARY
Glucose-Capillary: 311 mg/dL — ABNORMAL HIGH (ref 70–99)
Glucose-Capillary: 361 mg/dL — ABNORMAL HIGH (ref 70–99)
Glucose-Capillary: 278 mg/dL — ABNORMAL HIGH (ref 70–99)

## 2012-12-25 LAB — HEMOGLOBIN A1C
Hgb A1c MFr Bld: 12.7 % — ABNORMAL HIGH (ref <5.7)
Mean Plasma Glucose: 318 mg/dL — ABNORMAL HIGH (ref <117)

## 2012-12-25 LAB — CBC
HCT: 41.6 % (ref 36.0–46.0)
MCHC: 33.7 g/dL (ref 30.0–36.0)
MCV: 87.2 fL (ref 78.0–100.0)
Platelets: 205 10*3/uL (ref 150–400)
RBC: 4.77 MIL/uL (ref 3.87–5.11)
RDW: 14.1 % (ref 11.5–15.5)
WBC: 5.4 10*3/uL (ref 4.0–10.5)

## 2012-12-25 LAB — TROPONIN I
Troponin I: <0.3 ng/mL (ref <0.30)
Troponin I: <0.3 ng/mL (ref <0.30)

## 2012-12-25 LAB — MRSA PCR SCREENING: MRSA by PCR: NEGATIVE

## 2012-12-25 MED ORDER — INSULIN GLARGINE 100 UNIT/ML ~~LOC~~ SOLN
20.0000 [IU] | Freq: Every day | SUBCUTANEOUS | Status: DC
  Administered 2012-12-25: 20 [IU] via SUBCUTANEOUS
  Filled 2012-12-25 (×2): qty 0.2

## 2012-12-25 MED ORDER — INSULIN ASPART 100 UNIT/ML ~~LOC~~ SOLN: 6.0000 [IU] | Freq: Three times a day (TID) | SUBCUTANEOUS | Status: DC

## 2012-12-25 MED ORDER — INSULIN ASPART 100 UNIT/ML ~~LOC~~ SOLN
6.0000 [IU] | Freq: Three times a day (TID) | SUBCUTANEOUS | Status: DC
  Administered 2012-12-25 – 2012-12-26 (×3): 6 [IU] via SUBCUTANEOUS

## 2012-12-25 NOTE — Progress Notes (Addendum)
[facility] Progress Note [place] TEAM 1 - Stepdown/ICU TEAM   CLYTEE HEINRICH ZOX:[id number] DOB: 08-05-1940 DOA: 12/24/2012 PCP: [name], MD  Admit HPI / Brief Narrative: 72 y.o. female with history of type II DM, hypertension, dementia, severe nonischemic cardiomyopathy/chronic systolic CHF, status post dual-chamber ICD implantation, who was referred from an urgent care for evaluation and management of significant hyperglycemia and EKG changes. As per patient, she started experiencing left-sided chest pain which was made worse by lying on that side. When she went to the urgent care center she reportedly was told she had an infection around the ICD chamber site and a UTI. She was started on antibiotics for same and her insulins were adjusted and she was asked to return for f/u evaluation. When she went for followup she was told she had an abnormal EKG and hyperglycemia and was transferred to [facility] ED for evaluation.   HPI/Subjective: The patient states she feels much better and is asking for Cheetos to eat.  She denies chest pain fevers chills nausea vomiting or abdominal pain.  Assessment/Plan:  Severe hyperglycemia in uncontrolled DM2 - Hyperosmolar state CBG not at goal, but improved - no acidosis/DKA - adjust tx and follow - educate on proper diet   Atypical chest pain Troponin negative x3 - sx have resolved   Nonischemic cardiomyopathy/chronic systolic CHF - s/p AICD 2-D echo in 2010 showed EF of 20-25% - no evidence of decompensation at this time - follow trend   Dementia Lives with her daughter   History of recent UTI Continue Keflex to complete prescribed course - f/u UA unremarkable   HTN BP well controlled  Code Status: FULL Family Communication: spoke w/ pt and granddaughter at bedside  Disposition Plan: SDU - possible d/c home in AM  Consultants: none  Procedures: none  Antibiotics: Keflex prior to admit >>  DVT  prophylaxis: lovenox  Objective: Blood pressure 116/67, pulse 85, temperature 97.8 F (36.6 C), temperature source Oral, resp. rate 19, height 5\' 5"  (1.651 m), weight 77.9 kg (171 lb 11.8 oz), SpO2 95.00%.  Intake/Output Summary (Last 24 hours) at 12/25/12 1532 Last data filed at 12/25/12 1215  Gross per 24 hour  Intake      0 ml  Output    900 ml  Net   -900 ml   Exam: General: No acute respiratory distress Lungs: Clear to auscultation bilaterally without wheezes or crackles Cardiovascular: Regular rate and rhythm without murmur gallop or rub normal S1 and S2 Abdomen: Nontender, nondistended, soft, bowel sounds positive, no rebound, no ascites, no appreciable mass Extremities: No significant cyanosis, clubbing, or edema bilateral lower extremities  Data Reviewed: Basic Metabolic Panel:  Recent Labs Lab 12/24/12 1441 12/24/12 2005 12/25/12 1002  NA 127* 128* 130*  K 3.7 3.3* 3.8  CL 90* 92* 94*  CO2 23 24 22   GLUCOSE 510* 251* 359*  BUN 25* 23 23  CREATININE 1.22* 1.13* 1.15*  CALCIUM 11.3* 11.3* 10.1   Liver Function Tests: No results found for this basename: AST, ALT, ALKPHOS, BILITOT, PROT, ALBUMIN,  in the last 168 hours  CBC:  Recent Labs Lab 12/24/12 1441 12/25/12 1002  WBC 5.3 5.4  HGB 14.5 14.0  HCT 42.6 41.6  MCV 87.1 87.2  PLT 228 205   Cardiac Enzymes:  Recent Labs Lab 12/24/12 2005 12/25/12 0117 12/25/12 1002  TROPONINI <0.30 <0.30 <0.30   BNP (last 3 results)  Recent Labs  07/11/12 1526  PROBNP 299.3*  CBG:  Recent Labs Lab 12/24/12 1804 12/24/12 2056 12/25/12 0738 12/25/12 1127  GLUCAP 369* 168* 311* 361*    Recent Results (from the past 240 hour(s))  MRSA PCR SCREENING     Status: None   Collection Time    12/24/12 10:20 PM      Result Value Range Status   MRSA by PCR NEGATIVE  NEGATIVE Final   Comment:            The GeneXpert MRSA Assay (FDA     approved for NASAL specimens     only), is one component of a      comprehensive MRSA colonization     surveillance program. It is not     intended to diagnose MRSA     infection nor to guide or     monitor treatment for     MRSA infections.     Studies:  Recent x-ray studies have been reviewed in detail by the Attending Physician  Scheduled Meds:  Scheduled Meds: . aspirin  325 mg Oral Daily  . atorvastatin  40 mg Oral Daily  . celecoxib  200 mg Oral BID  . cephALEXin  500 mg Oral BID  . enoxaparin (LOVENOX) injection  40 mg Subcutaneous Q24H  . insulin aspart  0-15 Units Subcutaneous TID WC  . insulin aspart  0-5 Units Subcutaneous QHS  . insulin glargine  10 Units Subcutaneous QHS  . lisinopril  20 mg Oral Daily  . pantoprazole  40 mg Oral Daily    Time spent on care of this patient: 35 mins   [facility] T  [facility] Office  [phone] Pager - Text Page per Loretha Stapler as per below:  On-Call/Text Page:      [email or web address]      password [email or web address]  If 7PM-7AM, please contact night-coverage [email or web address] Password [facility] 12/25/2012, 3:32 PM   LOS: 1 day

## 2012-12-26 DIAGNOSIS — I517 Cardiomegaly: Secondary | ICD-10-CM

## 2012-12-26 LAB — GLUCOSE, CAPILLARY
Glucose-Capillary: 181 mg/dL — ABNORMAL HIGH (ref 70–99)
Glucose-Capillary: 203 mg/dL — ABNORMAL HIGH (ref 70–99)

## 2012-12-26 LAB — BASIC METABOLIC PANEL
CO2: 26 mEq/L (ref 19–32)
Calcium: 10.4 mg/dL (ref 8.4–10.5)
Chloride: 97 mEq/L (ref 96–112)
Creatinine, Ser: 1.18 mg/dL — ABNORMAL HIGH (ref 0.50–1.10)
GFR calc non Af Amer: 45 mL/min — ABNORMAL LOW (ref >90)
Glucose, Bld: 207 mg/dL — ABNORMAL HIGH (ref 70–99)
Sodium: 134 mEq/L — ABNORMAL LOW (ref 135–145)

## 2012-12-26 MED ORDER — INSULIN ASPART 100 UNIT/ML ~~LOC~~ SOLN: 6.0000 [IU] | Freq: Three times a day (TID) | SUBCUTANEOUS | Status: DC

## 2012-12-26 MED ORDER — INSULIN DETEMIR 100 UNIT/ML FLEXPEN: 22.0000 [IU] | PEN_INJECTOR | Freq: Two times a day (BID) | SUBCUTANEOUS | Status: DC

## 2012-12-26 MED ORDER — CELECOXIB 200 MG PO CAPS: 200.0000 mg | ORAL_CAPSULE | Freq: Two times a day (BID) | ORAL | Status: DC | PRN

## 2012-12-26 NOTE — Progress Notes (Signed)
Discharge instructions given to pt and granddaughter.  Both verbalized understanding with all questions answered.  Pt discharged to home with granddaughter.  Roselie Awkward, RN

## 2012-12-26 NOTE — Progress Notes (Signed)
  Echocardiogram 2D Echocardiogram has been performed.  Arvil Chaco 12/26/2012, 9:44 AM

## 2012-12-26 NOTE — Progress Notes (Signed)
Utilization review completed.  

## 2012-12-26 NOTE — Care Management Note (Signed)
    Page 1 of 1   12/26/2012     3:03:06 PM   CARE MANAGEMENT NOTE 12/26/2012  Patient:  Paula [name], Paula [name]   Account Number:  [id number]  Date Initiated:  12/26/2012  Documentation initiated by:  Donn Pierini  Subjective/Objective Assessment:   Pt admitted with Uncontrolled type II DM with hyperosmolar state, c/p, weakness     Action/Plan:   PTA pt lived at home- has an aide with [employer] through [facility]   Anticipated DC Date:  12/26/2012   Anticipated DC Plan:  HOME/SELF CARE      DC Planning Services  CM consult      [facility] Choice  HOME HEALTH   Choice offered to / List presented to:  C-1 Patient        HH arranged  HH-1 RN      [place] agency  [facility]   Status of service:  Completed, signed off Medicare Important Message given?   (If response is "NO", the following Medicare IM given date fields will be blank) Date Medicare IM given:   Date Additional Medicare IM given:    Discharge Disposition:  HOME W HOME HEALTH SERVICES  Per UR Regulation:  Reviewed for med. necessity/level of care/duration of stay  If discussed at Long Length of Stay Meetings, dates discussed:    Comments:  12/26/12- 1500- Donn Pierini RN, BSN [phone] Referral for [facility] - RN- pt for d/c today-  per conversation with pt- she has an aide with [employer] that comes daily from 9-4- pt is agreeable to a nurse coming out- states that [facility] would be fine for services- referral for HH-RN called to Hilda Lias with [facility] - services to begin within 24-48 hr post discharge.

## 2012-12-26 NOTE — Discharge Summary (Signed)
DISCHARGE SUMMARY  Paula [name]  MR#: [id number]  DOB:11/14/1940  Date of Admission: 12/24/2012 Date of Discharge: 12/26/2012  Attending Physician:Janiaya Ryser T  Patient's NWG:[id number],MVHQIO M, MD  Consults: none  Disposition: D/C home w/ HH assistance   Follow-up Appts:     Follow-up Information   Follow up with Paula Flake, MD. Schedule an appointment as soon as possible for a visit in 3 days.   Specialty:  Emergency Medicine   Contact information:   [address] [place] [address] [phone]      Tests Needing Follow-up: Close monitoring of CBG is suggested as further adjustment in insulin dosing is likely to be required   Discharge Diagnoses: Severe hyperglycemia in uncontrolled DM2 - Hyperosmolar state  Atypical chest pain  CHRONIC LBBB Nonischemic cardiomyopathy/chronic systolic CHF - s/p AICD  Dementia  History of recent UTI  HTN   Initial presentation: 72 y.o. female with history of type II DM, hypertension, dementia, severe nonischemic cardiomyopathy/chronic systolic CHF, status post dual-chamber ICD implantation, who was referred from an urgent care for evaluation and management of significant hyperglycemia and "EKG changes." As per patient, she started experiencing left-sided chest pain which was made worse by lying on that side. When she went to the urgent care center she reportedly was told she had an infection around the ICD chamber site and a UTI. She was started on antibiotics for same and her insulins were adjusted and she was asked to return for f/u evaluation. When she went for followup she was told she had an "abnormal EKG" and hyperglycemia and was transferred to [facility] ED for evaluation.   Hospital Course:  Severe hyperglycemia in uncontrolled DM2 - Hyperosmolar state  CBG not at goal, but improved - no acidosis/DKA - educated on proper diet but pt non-compliant and w/ limited understanding due to cognitive  impairment - ordered HH aide and RN to assist with disease management - increased home insulin regimen significantly in anticipation of dietary indiscretion at home - advised close f/u w/ her PCP for further adjustment   Atypical chest pain  Troponin negative x3 - sx have resolved entirely - EKG revealed CHRONIC LBB   Nonischemic cardiomyopathy/chronic systolic CHF - s/p AICD  2-D echo in 2010 showed EF of 20-25% - no evidence of decompensation during this hospital stay  Dementia  Lives with her daughter - have asked for [facility] aide and RN to assist w/ care of DM   History of recent UTI  Continue Keflex to complete previously prescribed course - f/u UA unremarkable   HTN  BP well controlled     Medication List    STOP taking these medications       chlorthalidone 25 MG tablet  Commonly known as:  HYGROTON     insulin glargine 100 units/mL Soln  Commonly known as:  LANTUS      TAKE these medications       alendronate 70 MG tablet  Commonly known as:  FOSAMAX  Take 70 mg by mouth once a week. Take with a full glass of water on an empty stomach. On wednesdays     aspirin 325 MG tablet  Take 325 mg by mouth daily.     atorvastatin 40 MG tablet  Commonly known as:  LIPITOR  Take 40 mg by mouth daily.     celecoxib 200 MG capsule  Commonly known as:  CELEBREX  Take 1 capsule (200 mg total) by mouth 2 (two) times daily as  needed for moderate pain.     cephALEXin 500 MG capsule  Commonly known as:  KEFLEX  Take 500 mg by mouth 2 (two) times daily.     fenofibrate 160 MG tablet  Take 160 mg by mouth daily.     Fish Oil 1200 MG Caps  Take 1 capsule by mouth daily.     furosemide 20 MG tablet  Commonly known as:  LASIX  Take 20 mg by mouth daily.     insulin aspart 100 UNIT/ML injection  Commonly known as:  NOVOLOG  Inject 6 Units into the skin 3 (three) times daily before meals.     Insulin Detemir 100 UNIT/ML Sopn  Commonly known as:  LEVEMIR FLEXTOUCH  Inject 22  Units into the skin 2 (two) times daily before a meal.     isosorbide mononitrate 60 MG 24 hr tablet  Commonly known as:  IMDUR  Take 60 mg by mouth daily.     lisinopril 20 MG tablet  Commonly known as:  PRINIVIL,ZESTRIL  Take 20 mg by mouth daily.     loratadine-pseudoephedrine 5-120 MG per tablet  Commonly known as:  CLARITIN-D 12-hour  Take 1 tablet by mouth 2 (two) times daily as needed for allergies.     pantoprazole 40 MG tablet  Commonly known as:  PROTONIX  Take 40 mg by mouth daily.     potassium chloride SA 20 MEQ tablet  Commonly known as:  K-DUR,KLOR-CON  Take 20 mEq by mouth daily.     traMADol 50 MG tablet  Commonly known as:  ULTRAM  Take 1 tablet (50 mg total) by mouth every 6 (six) hours as needed. For pain       Day of Discharge BP 127/78  Pulse 80  Temp(Src) 97.9 F (36.6 C) (Oral)  Resp 23  Ht 5\' 5"  (1.651 m)  Wt 78.8 kg (173 lb 11.6 oz)  BMI 28.91 kg/m2  SpO2 98%  Physical Exam: General: No acute respiratory distress - alert  Lungs: Clear to auscultation bilaterally without wheezes or crackles Cardiovascular: Regular rate and rhythm without murmur gallop or rub normal S1 and S2 Abdomen: Nontender, nondistended, soft, bowel sounds positive, no rebound, no ascites, no appreciable mass Extremities: No significant cyanosis, clubbing, or edema bilateral lower extremities  Results for orders placed during the hospital encounter of 12/24/12 (from the past 24 hour(s))  GLUCOSE, CAPILLARY     Status: Abnormal   Collection Time    12/25/12  5:43 PM      Result Value Range   Glucose-Capillary 278 (*) 70 - 99 mg/dL  GLUCOSE, CAPILLARY     Status: Abnormal   Collection Time    12/25/12  9:31 PM      Result Value Range   Glucose-Capillary 181 (*) 70 - 99 mg/dL   Comment 1 Notify RN     Comment 2 Documented in Chart    BASIC METABOLIC PANEL     Status: Abnormal   Collection Time    12/26/12  4:45 AM      Result Value Range   Sodium 134 (*) 135 -  145 mEq/L   Potassium 3.5  3.5 - 5.1 mEq/L   Chloride 97  96 - 112 mEq/L   CO2 26  19 - 32 mEq/L   Glucose, Bld 207 (*) 70 - 99 mg/dL   BUN 22  6 - 23 mg/dL   Creatinine, Ser 1.61 (*) 0.50 - 1.10 mg/dL   Calcium 09.6  8.4 -  10.5 mg/dL   GFR calc non Af Amer 45 (*) >90 mL/min   GFR calc Af Amer 52 (*) >90 mL/min  GLUCOSE, CAPILLARY     Status: Abnormal   Collection Time    12/26/12  7:35 AM      Result Value Range   Glucose-Capillary 203 (*) 70 - 99 mg/dL   Comment 1 Notify RN     Comment 2 Documented in Chart    GLUCOSE, CAPILLARY     Status: Abnormal   Collection Time    12/26/12 12:42 PM      Result Value Range   Glucose-Capillary 287 (*) 70 - 99 mg/dL   Comment 1 Notify RN     Comment 2 Documented in Chart      Time spent in discharge (includes decision making & examination of pt): 30 minutes  12/26/2012, 2:16 PM   Lonia Blood, MD [facility] Office  [phone] Pager [phone]  On-Call/Text Page:      [email or web address]      password [facility]

## 2013-02-05 ENCOUNTER — Emergency Department (HOSPITAL_COMMUNITY): Payer: Medicare Other

## 2013-02-05 ENCOUNTER — Encounter (HOSPITAL_COMMUNITY): Payer: Self-pay | Admitting: Emergency Medicine

## 2013-02-05 ENCOUNTER — Observation Stay (HOSPITAL_BASED_OUTPATIENT_CLINIC_OR_DEPARTMENT_OTHER)
Admission: EM | Admit: 2013-02-05 | Discharge: 2013-02-07 | Disposition: A | Discharge status: Home / Self Care | Payer: Medicare Other | Source: Home / Self Care | Attending: Emergency Medicine | Admitting: Emergency Medicine

## 2013-02-05 DIAGNOSIS — I428 Other cardiomyopathies: Secondary | ICD-10-CM

## 2013-02-05 DIAGNOSIS — N183 Chronic kidney disease, stage 3 unspecified: Secondary | ICD-10-CM | POA: Insufficient documentation

## 2013-02-05 DIAGNOSIS — Z7983 Long term (current) use of bisphosphonates: Secondary | ICD-10-CM | POA: Insufficient documentation

## 2013-02-05 DIAGNOSIS — I509 Heart failure, unspecified: Secondary | ICD-10-CM

## 2013-02-05 DIAGNOSIS — N179 Acute kidney failure, unspecified: Secondary | ICD-10-CM | POA: Insufficient documentation

## 2013-02-05 DIAGNOSIS — Z7982 Long term (current) use of aspirin: Secondary | ICD-10-CM | POA: Insufficient documentation

## 2013-02-05 DIAGNOSIS — N3941 Urge incontinence: Secondary | ICD-10-CM | POA: Insufficient documentation

## 2013-02-05 DIAGNOSIS — M899 Disorder of bone, unspecified: Secondary | ICD-10-CM | POA: Insufficient documentation

## 2013-02-05 DIAGNOSIS — F0390 Unspecified dementia without behavioral disturbance: Secondary | ICD-10-CM | POA: Insufficient documentation

## 2013-02-05 DIAGNOSIS — Z87891 Personal history of nicotine dependence: Secondary | ICD-10-CM

## 2013-02-05 DIAGNOSIS — E119 Type 2 diabetes mellitus without complications: Secondary | ICD-10-CM

## 2013-02-05 DIAGNOSIS — I5021 Acute systolic (congestive) heart failure: Secondary | ICD-10-CM | POA: Diagnosis present

## 2013-02-05 DIAGNOSIS — I10 Essential (primary) hypertension: Secondary | ICD-10-CM

## 2013-02-05 DIAGNOSIS — I129 Hypertensive chronic kidney disease with stage 1 through stage 4 chronic kidney disease, or unspecified chronic kidney disease: Secondary | ICD-10-CM | POA: Insufficient documentation

## 2013-02-05 DIAGNOSIS — M949 Disorder of cartilage, unspecified: Secondary | ICD-10-CM

## 2013-02-05 DIAGNOSIS — R51 Headache: Secondary | ICD-10-CM | POA: Insufficient documentation

## 2013-02-05 DIAGNOSIS — E1165 Type 2 diabetes mellitus with hyperglycemia: Secondary | ICD-10-CM

## 2013-02-05 DIAGNOSIS — R079 Chest pain, unspecified: Secondary | ICD-10-CM

## 2013-02-05 DIAGNOSIS — Z95810 Presence of automatic (implantable) cardiac defibrillator: Secondary | ICD-10-CM

## 2013-02-05 DIAGNOSIS — I5023 Acute on chronic systolic (congestive) heart failure: Secondary | ICD-10-CM

## 2013-02-05 DIAGNOSIS — IMO0001 Reserved for inherently not codable concepts without codable children: Secondary | ICD-10-CM | POA: Insufficient documentation

## 2013-02-05 HISTORY — DX: Other specified disorders of bone density and structure, unspecified site: M85.80

## 2013-02-05 LAB — COMPREHENSIVE METABOLIC PANEL
ALBUMIN: 3.5 g/dL (ref 3.5–5.2)
ALK PHOS: 59 U/L (ref 39–117)
ALT: 76 U/L — ABNORMAL HIGH (ref 0–35)
AST: 47 U/L — AB (ref 0–37)
BILIRUBIN TOTAL: 0.3 mg/dL (ref 0.3–1.2)
BUN: 18 mg/dL (ref 6–23)
CHLORIDE: 108 meq/L (ref 96–112)
CO2: 20 mEq/L (ref 19–32)
Calcium: 9.7 mg/dL (ref 8.4–10.5)
Creatinine, Ser: 1.1 mg/dL (ref 0.50–1.10)
GFR calc Af Amer: 56 mL/min — ABNORMAL LOW (ref >90)
GFR calc non Af Amer: 49 mL/min — ABNORMAL LOW (ref >90)
Glucose, Bld: 116 mg/dL — ABNORMAL HIGH (ref 70–99)
POTASSIUM: 4.1 meq/L (ref 3.7–5.3)
Sodium: 142 mEq/L (ref 137–147)
Total Protein: 6.5 g/dL (ref 6.0–8.3)

## 2013-02-05 LAB — GLUCOSE, CAPILLARY
Glucose-Capillary: 133 mg/dL — ABNORMAL HIGH (ref 70–99)
Glucose-Capillary: 318 mg/dL — ABNORMAL HIGH (ref 70–99)
Glucose-Capillary: 86 mg/dL (ref 70–99)

## 2013-02-05 LAB — CBC
HEMATOCRIT: 36.1 % (ref 36.0–46.0)
Hemoglobin: 11.4 g/dL — ABNORMAL LOW (ref 12.0–15.0)
MCH: 28.4 pg (ref 26.0–34.0)
MCHC: 31.6 g/dL (ref 30.0–36.0)
MCV: 90 fL (ref 78.0–100.0)
PLATELETS: 228 10*3/uL (ref 150–400)
RBC: 4.01 MIL/uL (ref 3.87–5.11)
RDW: 16.8 % — AB (ref 11.5–15.5)
WBC: 6 10*3/uL (ref 4.0–10.5)

## 2013-02-05 LAB — PRO B NATRIURETIC PEPTIDE: Pro B Natriuretic peptide (BNP): 4211 pg/mL — ABNORMAL HIGH (ref 0–125)

## 2013-02-05 LAB — TROPONIN I: Troponin I: <0.3 ng/mL (ref <0.30)

## 2013-02-05 MED ORDER — LISINOPRIL 20 MG PO TABS
20.0000 mg | ORAL_TABLET | Freq: Every day | ORAL | Status: DC
  Administered 2013-02-05: 12:00:00 20 mg via ORAL
  Filled 2013-02-05: qty 1

## 2013-02-05 MED ORDER — FUROSEMIDE 10 MG/ML IJ SOLN
40.0000 mg | Freq: Once | INTRAMUSCULAR | Status: AC
  Administered 2013-02-05: 40 mg via INTRAVENOUS
  Filled 2013-02-05: qty 4

## 2013-02-05 MED ORDER — HEPARIN SODIUM (PORCINE) 5000 UNIT/ML IJ SOLN
5000.0000 [IU] | Freq: Three times a day (TID) | INTRAMUSCULAR | Status: DC
  Administered 2013-02-05 – 2013-02-07 (×6): 5000 [IU] via SUBCUTANEOUS
  Filled 2013-02-05 (×9): qty 1

## 2013-02-05 MED ORDER — ACETAMINOPHEN 650 MG RE SUPP: 650.0000 mg | Freq: Four times a day (QID) | RECTAL | Status: DC | PRN

## 2013-02-05 MED ORDER — FUROSEMIDE 10 MG/ML IJ SOLN
20.0000 mg | Freq: Every day | INTRAMUSCULAR | Status: DC
  Filled 2013-02-05: qty 2

## 2013-02-05 MED ORDER — ATORVASTATIN CALCIUM 40 MG PO TABS
40.0000 mg | ORAL_TABLET | Freq: Every day | ORAL | Status: DC
  Administered 2013-02-05 – 2013-02-07 (×3): 40 mg via ORAL
  Filled 2013-02-05 (×3): qty 1

## 2013-02-05 MED ORDER — SODIUM CHLORIDE 0.9 % IJ SOLN
3.0000 mL | Freq: Two times a day (BID) | INTRAMUSCULAR | Status: DC
  Administered 2013-02-06: 3 mL via INTRAVENOUS

## 2013-02-05 MED ORDER — INSULIN DETEMIR 100 UNIT/ML ~~LOC~~ SOLN
10.0000 [IU] | Freq: Two times a day (BID) | SUBCUTANEOUS | Status: DC
  Administered 2013-02-05 – 2013-02-06 (×3): 10 [IU] via SUBCUTANEOUS
  Filled 2013-02-05 (×5): qty 0.1

## 2013-02-05 MED ORDER — SODIUM CHLORIDE 0.9 % IV SOLN: 250.0000 mL | INTRAVENOUS | Status: DC | PRN

## 2013-02-05 MED ORDER — LISINOPRIL 40 MG PO TABS
40.0000 mg | ORAL_TABLET | Freq: Every day | ORAL | Status: DC
  Administered 2013-02-06 – 2013-02-07 (×2): 40 mg via ORAL
  Filled 2013-02-05 (×4): qty 1

## 2013-02-05 MED ORDER — ASPIRIN 325 MG PO TABS
325.0000 mg | ORAL_TABLET | Freq: Every day | ORAL | Status: DC
  Administered 2013-02-05 – 2013-02-07 (×3): 325 mg via ORAL
  Filled 2013-02-05 (×3): qty 1

## 2013-02-05 MED ORDER — SODIUM CHLORIDE 0.9 % IJ SOLN: 3.0000 mL | INTRAMUSCULAR | Status: DC | PRN

## 2013-02-05 MED ORDER — PANTOPRAZOLE SODIUM 40 MG PO TBEC
40.0000 mg | DELAYED_RELEASE_TABLET | Freq: Every day | ORAL | Status: DC
  Administered 2013-02-05 – 2013-02-07 (×3): 40 mg via ORAL
  Filled 2013-02-05: qty 1

## 2013-02-05 MED ORDER — ISOSORBIDE MONONITRATE ER 60 MG PO TB24
60.0000 mg | ORAL_TABLET | Freq: Every day | ORAL | Status: DC
  Administered 2013-02-05 – 2013-02-07 (×3): 60 mg via ORAL
  Filled 2013-02-05 (×3): qty 1

## 2013-02-05 MED ORDER — SODIUM CHLORIDE 0.9 % IJ SOLN
3.0000 mL | Freq: Two times a day (BID) | INTRAMUSCULAR | Status: DC
  Administered 2013-02-05 – 2013-02-06 (×2): 3 mL via INTRAVENOUS

## 2013-02-05 MED ORDER — ACETAMINOPHEN 325 MG PO TABS
650.0000 mg | ORAL_TABLET | Freq: Four times a day (QID) | ORAL | Status: DC | PRN
  Administered 2013-02-06 (×3): 650 mg via ORAL
  Filled 2013-02-05 (×3): qty 2

## 2013-02-05 MED ORDER — INSULIN ASPART 100 UNIT/ML ~~LOC~~ SOLN
0.0000 [IU] | Freq: Three times a day (TID) | SUBCUTANEOUS | Status: DC
  Administered 2013-02-05: 3 [IU] via SUBCUTANEOUS
  Administered 2013-02-05: 15 [IU] via SUBCUTANEOUS
  Administered 2013-02-06: 3 [IU] via SUBCUTANEOUS
  Administered 2013-02-06: 4 [IU] via SUBCUTANEOUS
  Administered 2013-02-06 – 2013-02-07 (×2): 3 [IU] via SUBCUTANEOUS

## 2013-02-05 NOTE — H&P (Signed)
Date: 02/05/2013               Patient Name:  Paula [name] MRN: [id number]  DOB: [birth year] Age / Sex: 73 y.o., female   PCP: [name], MD     [facility] Office: [phone] Cell: [phone]    Medical Service: Internal Medicine Teaching Service         Attending Physician: Dr. Josem Kaufmann    First Contact: Dr. Andrey Campanile Pager: [phone]  Second Contact: Dr. Sherrine Maples Pager: [phone]       After Hours (After 5p/  First Contact Pager: [phone]  weekends / holidays): Second Contact Pager: [phone]   Chief Complaint: Shortness of breath  History of Present Illness:  Ms. Paula [name] is a 73 yo woman with history of nonischemic sCHF (20%, 2010) with PM/ICD, DM, HTN and h/o CVA who presents with 2-3 days of increasing SOB at rest and on exertion, as well as a dry cough for 2-3 weeks.  She denies chest pain at present, but did have an episode of CP yesterday while she was preparing food in the kitchen.  For the last 2 months, she has been using 5-6 pillows to sleep. She reports palpitations, as well as intermittent subjective fevers/chills.  She feels lightheaded when walking, but no dizziness. She reports increased swelling in her legs, abdomen & b/l periorbital regions, and has a "so so" appetite and energy level.  She has not been sleeping well secondary to her "nerves" and waking up to breathe.    Regarding her activities of daily living (dementia on her PMH, lives with daughter, daughter not present), she can shower, dress and feed self.  She can go to the restroom on her own, but has urinary urge incontinence (no bowel incontinence).    Review of Systems: HEENT: Denies photophobia, eye pain, redness, hearing loss, ear pain, congestion, sore throat, rhinorrhea, sneezing, mouth sores, trouble swallowing, neck pain, neck stiffness and tinnitus.  Respiratory: per HPI Cardiovascular: per HPI Gastrointestinal: Denies nausea, vomiting, abdominal pain, diarrhea, constipation,blood in  stool and abdominal distention.  Genitourinary: Denies dysuria, urgency, frequency, hematuria, flank pain and difficulty urinating.  Skin: Denies pallor, rash and wound.  Neurological: Denies seizures, syncope, weakness, numbness and headaches.  Hematological: No obvious s/s of bleeding  Meds: Current Facility-Administered Medications  Medication Dose Route Frequency Provider Last Rate Last Dose  . furosemide (LASIX) injection 40 mg  40 mg Intravenous Once Suzi Roots, MD       Medication Sig   . aspirin 325 MG tablet Take 325 mg by mouth daily.    Marland Kitchen alendronate (FOSAMAX) 70 MG tablet Take 70 mg by mouth once a week. Take with a full glass of water on an empty stomach. On wednesdays    . atorvastatin (LIPITOR) 40 MG tablet Take 40 mg by mouth daily.    . celecoxib (CELEBREX) 200 MG capsule Take 1 capsule (200 mg total) by mouth 2 (two) times daily as needed for moderate pain.  Not Taking  . cephALEXin (KEFLEX) 500 MG capsule Take 500 mg by mouth 2 (two) times daily.  Not Taking  . fenofibrate 160 MG tablet Take 160 mg by mouth daily.    . furosemide (LASIX) 20 MG tablet Take 20 mg by mouth daily.    . insulin aspart (NOVOLOG) 100 UNIT/ML injection Inject 6 Units into the skin 3 (three) times daily before meals.   . Insulin Detemir (LEVEMIR FLEXTOUCH) 100 UNIT/ML SOPN Inject 22 Units into  the skin 2 (two) times daily before a meal.   . isosorbide mononitrate (IMDUR) 60 MG 24 hr tablet Take 60 mg by mouth daily.    Marland Kitchen. lisinopril (PRINIVIL,ZESTRIL) 20 MG tablet Take 20 mg by mouth daily.    Marland Kitchen. loratadine-pseudoephedrine (CLARITIN-D 12-HOUR) 5-120 MG per tablet Take 1 tablet by mouth 2 (two) times daily as needed for allergies.    . Omega-3 Fatty Acids (FISH OIL) 1200 MG CAPS Take 1 capsule by mouth daily.    . pantoprazole (PROTONIX) 40 MG tablet Take 40 mg by mouth daily.    . potassium chloride SA (K-DUR,KLOR-CON) 20 MEQ tablet Take 20 mEq by mouth daily.    . traMADol (ULTRAM) 50 MG  tablet Take 1 tablet (50 mg total) by mouth every 6 (six) hours as needed. For pain      Allergies: Allergies as of 02/05/2013  . (No Known Allergies)   Past Medical History  Diagnosis Date  . Systolic CHF     Non ischemic, last Cath 09/21/08 - normal left main, LAD, LCx, ramus intermedius, RCA  . Hypertension   . Stroke   . Headache(784.0)   . Diabetes mellitus   . Dementia 05/27/2012  . Osteopenia     DEXA 2012 : T score hip -2.3, femur -2.1   Past Surgical History  Procedure Laterality Date  . Insert / replace / remove pacemaker  12/15/2010  . Back surgery    . Abdominal hysterectomy     History reviewed. No pertinent family history.  History   Social History  . Marital Status: Single    Spouse Name: N/A    Number of Children: N/A  . Years of Education: N/A   Occupational History  . Not on file.   Social History Main Topics  . Smoking status: Former Smoker    Types: Cigarettes  . Smokeless tobacco: Never Used  . Alcohol Use: No  . Drug Use: No  . Sexual Activity: No   Other Topics Concern  . Not on file   Social History Narrative  . No narrative on file    Physical Exam: Blood pressure 161/100, pulse 101, temperature 98.2 F (36.8 C), temperature source Oral, resp. rate 14, SpO2 100.00%. on RA General: resting in bed HEENT: PERRL, EOMI, no scleral icterus, MMM, b/l periorbital edema Cardiac: tachycardic, no rubs, murmurs or gallops, PM on left chest, not tender, no fluctuance, no overlying erythema Pulm: few bibasilar crackles, tachypneic during exam (RR up to 30s) Abd: soft, nontender, nondistended, BS normoactive Ext: warm and well perfused, b/l LE pedal edema Neuro: alert and oriented X person, place, birthdate, day of week, year, cranial nerves II-XII grossly intact   Lab results: Basic Metabolic Panel:  Recent Labs  78/29/5600/05/15 0544  NA 142  K 4.1  CL 108  CO2 20  GLUCOSE 116*  BUN 18  CREATININE 1.10  CALCIUM 9.7  AG 14  Liver  Function Tests:  Recent Labs  02/05/13 0544  AST 47*  ALT 76*  ALKPHOS 59  BILITOT 0.3  PROT 6.5  ALBUMIN 3.5   CBC:  Recent Labs  02/05/13 0544  WBC 6.0  HGB 11.4*  HCT 36.1  MCV 90.0  PLT 228   Cardiac Enzymes:  Recent Labs  02/05/13 0544  TROPONINI <0.30   BNP:  Recent Labs  02/05/13 0544  PROBNP 4211.0*    Imaging results:  Dg Chest 2 View  02/05/2013   CLINICAL DATA:  Chest pain A air  EXAM: CHEST  2 VIEW  COMPARISON:  Prior radiograph 12/24/2012  FINDINGS: Left-sided pacemaker again noted.  Cardiomegaly is stable.  Lungs are mildly hypoinflated. There is diffuse pulmonary vascular congestion with scattered Kerley B-lines, compatible with mild pulmonary edema. Small bilateral pleural effusions are suspected. No definite focal infiltrates. No pneumothorax.  Osseous structures are unchanged.  IMPRESSION: Cardiomegaly with mild diffuse pulmonary edema. Small bilateral pleural effusions are suspected.   Electronically Signed   By: Rise Mu M.D.   On: 02/05/2013 06:20    Other results: EKG: sinus tachycardia, LAD with LBBB, mild elevation of V4-5 but likely secondary to lead placement when compared to prior EKG from 12/24/12  Assessment & Plan by Problem: Ms. Rhine is a 73 year old woman with history of sCHF, DM, uncontrolled HTN and dementia who presents with 2-3 days of worsening SOB on 02/05/13.   # Acute on chronic systolic heart failure: Hemodynamically stable. Evidenced by symptoms, CXR & elevated pBNP.  Exacerbation likely d/t uncontrolled HTN.  Patient is on low-mod dose of lisinopril, and not on a beta blocker.  At her 12/24/12 admission, she was taking propranolol 10mg  daily, but this medication was not on her discharge med list, and it seems she is not on a BB. ACS unlikely etiology given clean cath in 2010 and symptoms have been persistent for 2-3 days with negative troponin.  Revised geneva score 6 (mod prob) d/t age and HR, but Wells  criteria is 1.5 (low risk), given the more likely reason for exacerbation is uncontrolled afterload.  Dietary indiscretions are always considered.  Daughter reports medication compliance. -Admit to tele (obs, monitor for electrolyte changes) -Lasix 40 IV today given in the ED, then 20mg  IV tomorrow AM -Plan to increase lisinopril to 40mg  at discharge -Restart BB at discharge (consider coreg 3.125 BID, and titrate up as able) -I/Os, daily weights (weight today pending, d/c weight in Dec was 173 lbs) ADDENDUM: Weight at admission 180 lbs.  ALT & AST mildly elevated, secondary to hepatic congestion?)  # DM2 (diabetes mellitus, type 2): CBG controlled at admission, home regimen is Detemir 22u BID and Aspart 6u TIDWC. -During hospitalization, decrease determir to 10u BID, increase as needed (decreased PO during acute illness?) -Resistant SSI  # HTN (hypertension): Elevated at admission, stable. -Home regimen includes Imdur 60 daily, lisinopril 20 daily, and lasix 20 daily -Continue imdur and lisinopril, lasix as above, titration of meds as above  # Dementia: Patient is oriented x person, place (and why she is here), year and birthdate on exam. Daughter (not at bedside, I spoke to her on the phone), reports she has a "touch of dementia"; She requires help with med mgmt, but able to feed, shower and dress self, as well as go to the restroom.  -Daughter is Balinda Quails [phone]  #CKD 3: GFR 56, at baseline. Likely d/t h/o uncontrolled HTN. -Monitor  #Code Status: DNR/DNI, discussed with patient and daughter, corroborated with old notes  #VTE ppx: heparin TID   Dispo: Disposition is deferred at this time, awaiting improvement of current medical problems. Anticipated discharge in approximately 1-2 day(s).   The patient does have a current PCP [name], MD) and does not need an [facility] hospital follow-up appointment after discharge.  The patient does not have transportation  limitations that hinder transportation to clinic appointments.  Signed: Belia Heman, MD 02/05/2013, 7:32 AM

## 2013-02-05 NOTE — ED Notes (Signed)
IV team paged and responded.

## 2013-02-05 NOTE — ED Notes (Signed)
Lab called to add on bnp.

## 2013-02-05 NOTE — ED Notes (Signed)
Attempted to call report; RN unavailable at this time.   

## 2013-02-05 NOTE — H&P (Signed)
Internal Medicine Attending Admission Note Date: 02/05/2013  Patient name: Paula [name] Medical record number: [id number] Date of birth: 04-11-1940 Age: 73 y.o. Gender: female  I saw and evaluated the patient. I reviewed the resident's note and I agree with the resident's findings and plan as documented in the resident's note.  Paula [name] is a 73 year old woman with a history of a nonischemic systolic cardiomyopathy with ICD, hypertension, diabetes, and history of cerebrovascular accident who presents with 2-3 days of increasing shortness of breath at rest as well as 2-3 weeks of a dry cough. She does note an episode of chest pain yesterday while preparing food. She has had 5-6 pillow orthopnea for the last 2 months and associated paroxysmal nocturnal dyspnea with increased swelling in her legs, abdomen, and periorbital regions. She states she's been compliant with her medications. With her progressive symptoms which now include dyspnea at rest she presented to the emergency department for further evaluation. She was admitted to the internal medicine teaching service for further evaluation and care.  Examination reveals an elderly woman who is mildly dyspneic with talking, mildly diaphoretic, with periorbital edema, bibasilar inspiratory crackles, no appreciated gallop or murmurs, and 1+ lower extremity edema. Blood pressure is 161/100 with a pulse of 101. Chest x-ray is notable for cardiomegaly with Kerly B-lines in the lung fields. ECG is notable for a left bundle branch block which is old.  I am concerned that Paula [name] has a decompensated nonischemic systolic cardiomyopathy likely secondary to an elevated afterload. Her heart failure regimen can be escalated upon discharge. While an inpatient we will diurese with IV Lasix. We will also try to decrease the afterload by increasing the lisinopril to 40 mg by mouth daily. When she is well compensated we will reinstitute beta-blockade. Further  adjustments in her heart failure regimen can be made in the outpatient setting. Although we will admit her as an observation status patient I am concerned she may require more than 2 midnights to appropriately address her cardiomyopathy. We will continue to manage her other chronic medical problems with her outpatient regimen.

## 2013-02-05 NOTE — Progress Notes (Signed)
Pt arriven from ED per stretcher. Alert/orieted. Oriented to room and flow of day.

## 2013-02-05 NOTE — ED Notes (Signed)
Pt from home, c/o chest pain, Brought in by EMS, Per ems pt was c/o of chest pain upon calling 911, but on arrival pain was 0/10, Pt was given 1 Sublingual Nitro at home which corrected the pain issue, Pt rec'd 324 ASA in route, Pt has early stages of dementia, pt is alert and oriented with some struggle with time,

## 2013-02-05 NOTE — ED Provider Notes (Signed)
CSN: [id number]     Arrival date & time 02/05/13  0430 History   None    Chief Complaint  Patient presents with  . Chest Pain   (Consider location/radiation/quality/duration/timing/severity/associated sxs/prior Treatment) Patient is a 73 y.o. female presenting with chest pain. The history is provided by the patient. The history is limited by the condition of the patient.  Chest Pain Associated symptoms: shortness of breath   Associated symptoms: no abdominal pain, no back pain, no cough, no fever, no headache, no palpitations and not vomiting   pt with hx non ischemic cm, dementia, presents w cp for the past day. Was constant. Dull. Not pleuritic. No associated nv or diaphoresis. +mild sob, ?orthopnea, no pnd. Pt denies cough or uri c/o. No fever or chills. Denies heartburn. Pain did not radiate, was at rest. No specific exacerbating or alleviating factors.  No unusual fatigue or doe.     Past Medical History  Diagnosis Date  . Angina   . Shortness of breath   . DEMENTIA   . Hypertension   . Stroke   . Headache(784.0)   . Diabetes mellitus   . Dementia 05/27/2012   Past Surgical History  Procedure Laterality Date  . Insert / replace / remove pacemaker  12/15/2010  . Back surgery    . Abdominal hysterectomy     History reviewed. No pertinent family history. History  Substance Use Topics  . Smoking status: Former Smoker    Types: Cigarettes  . Smokeless tobacco: Never Used  . Alcohol Use: No   OB History   Grav Para Term Preterm Abortions TAB SAB Ect Mult Living                 Review of Systems  Constitutional: Negative for fever and chills.  HENT: Negative for sore throat.   Eyes: Negative for redness.  Respiratory: Positive for shortness of breath. Negative for cough.   Cardiovascular: Positive for chest pain. Negative for palpitations.  Gastrointestinal: Negative for vomiting, abdominal pain and diarrhea.  Genitourinary: Negative for flank pain.  Musculoskeletal:  Negative for back pain and neck pain.  Skin: Negative for rash.  Neurological: Negative for headaches.  Hematological: Does not bruise/bleed easily.  Psychiatric/Behavioral: Negative for confusion.    Allergies  Review of patient's allergies indicates no known allergies.  Home Medications   Current Outpatient Rx  Name  Route  Sig  Dispense  Refill  . alendronate (FOSAMAX) 70 MG tablet   Oral   Take 70 mg by mouth once a week. Take with a full glass of water on an empty stomach. On wednesdays         . aspirin 325 MG tablet   Oral   Take 325 mg by mouth daily.         Marland Kitchen atorvastatin (LIPITOR) 40 MG tablet   Oral   Take 40 mg by mouth daily.         . celecoxib (CELEBREX) 200 MG capsule   Oral   Take 1 capsule (200 mg total) by mouth 2 (two) times daily as needed for moderate pain.         . cephALEXin (KEFLEX) 500 MG capsule   Oral   Take 500 mg by mouth 2 (two) times daily.         . fenofibrate 160 MG tablet   Oral   Take 160 mg by mouth daily.         . furosemide (LASIX) 20 MG tablet  Oral   Take 20 mg by mouth daily.         . insulin aspart (NOVOLOG) 100 UNIT/ML injection   Subcutaneous   Inject 6 Units into the skin 3 (three) times daily before meals.   1 vial   0   . Insulin Detemir (LEVEMIR FLEXTOUCH) 100 UNIT/ML SOPN   Subcutaneous   Inject 22 Units into the skin 2 (two) times daily before a meal.   3 mL   0   . isosorbide mononitrate (IMDUR) 60 MG 24 hr tablet   Oral   Take 60 mg by mouth daily.         Marland Kitchen lisinopril (PRINIVIL,ZESTRIL) 20 MG tablet   Oral   Take 20 mg by mouth daily.         Marland Kitchen loratadine-pseudoephedrine (CLARITIN-D 12-HOUR) 5-120 MG per tablet   Oral   Take 1 tablet by mouth 2 (two) times daily as needed for allergies.         . Omega-3 Fatty Acids (FISH OIL) 1200 MG CAPS   Oral   Take 1 capsule by mouth daily.         . pantoprazole (PROTONIX) 40 MG tablet   Oral   Take 40 mg by mouth daily.          . potassium chloride SA (K-DUR,KLOR-CON) 20 MEQ tablet   Oral   Take 20 mEq by mouth daily.         . traMADol (ULTRAM) 50 MG tablet   Oral   Take 1 tablet (50 mg total) by mouth every 6 (six) hours as needed. For pain   120 tablet   3    BP 166/124  Pulse 95  Temp(Src) 98.2 F (36.8 C) (Oral)  Resp 16  SpO2 98% Physical Exam  Nursing note and vitals reviewed. Constitutional: She appears well-developed and well-nourished. No distress.  HENT:  Mouth/Throat: Oropharynx is clear and moist.  Eyes: Conjunctivae are normal. No scleral icterus.  Neck: Neck supple. No tracheal deviation present.  Cardiovascular: Normal rate, regular rhythm and intact distal pulses.   Murmur heard. Pulmonary/Chest: Effort normal. No respiratory distress. She exhibits no tenderness.  Few rales/bases  Abdominal: Soft. Normal appearance and bowel sounds are normal. She exhibits no distension and no mass. There is no tenderness. There is no rebound and no guarding.  Musculoskeletal: She exhibits no tenderness.  Symmetric bil lower leg/ankle edema (pt states 'always like that). No calf pain or tenderness, no asymmetric swelling.   Neurological: She is alert.  Skin: Skin is warm and dry. No rash noted.  Psychiatric: She has a normal mood and affect.    ED Course  Procedures (including critical care time)   Results for orders placed during the hospital encounter of 02/05/13  CBC      Result Value Range   WBC 6.0  4.0 - 10.5 K/uL   RBC 4.01  3.87 - 5.11 MIL/uL   Hemoglobin 11.4 (*) 12.0 - 15.0 g/dL   HCT 87.8  67.6 - 72.0 %   MCV 90.0  78.0 - 100.0 fL   MCH 28.4  26.0 - 34.0 pg   MCHC 31.6  30.0 - 36.0 g/dL   RDW 94.7 (*) 09.6 - 28.3 %   Platelets 228  150 - 400 K/uL  COMPREHENSIVE METABOLIC PANEL      Result Value Range   Sodium 142  137 - 147 mEq/L   Potassium 4.1  3.7 - 5.3 mEq/L  Chloride 108  96 - 112 mEq/L   CO2 20  19 - 32 mEq/L   Glucose, Bld 116 (*) 70 - 99 mg/dL   BUN  18  6 - 23 mg/dL   Creatinine, Ser 1.611.10  0.50 - 1.10 mg/dL   Calcium 9.7  8.4 - 09.610.5 mg/dL   Total Protein 6.5  6.0 - 8.3 g/dL   Albumin 3.5  3.5 - 5.2 g/dL   AST 47 (*) 0 - 37 U/L   ALT 76 (*) 0 - 35 U/L   Alkaline Phosphatase 59  39 - 117 U/L   Total Bilirubin 0.3  0.3 - 1.2 mg/dL   GFR calc non Af Amer 49 (*) >90 mL/min   GFR calc Af Amer 56 (*) >90 mL/min  TROPONIN I      Result Value Range   Troponin I <0.30  <0.30 ng/mL   Dg Chest 2 View  02/05/2013   CLINICAL DATA:  Chest pain A air  EXAM: CHEST  2 VIEW  COMPARISON:  Prior radiograph 12/24/2012  FINDINGS: Left-sided pacemaker again noted.  Cardiomegaly is stable.  Lungs are mildly hypoinflated. There is diffuse pulmonary vascular congestion with scattered Kerley B-lines, compatible with mild pulmonary edema. Small bilateral pleural effusions are suspected. No definite focal infiltrates. No pneumothorax.  Osseous structures are unchanged.  IMPRESSION: Cardiomegaly with mild diffuse pulmonary edema. Small bilateral pleural effusions are suspected.   Electronically Signed   By: Rise MuBenjamin  McClintock M.D.   On: 02/05/2013 06:20      Date: 02/05/2013  Rate: 94  Rhythm: normal sinus rhythm  QRS Axis: left  Intervals: normal  ST/T Wave abnormalities: nonspecific ST/T changes  Conduction Disutrbances:left bundle branch block  Narrative Interpretation:   Old EKG Reviewed: unchanged    MDM  Iv ns. Ecg. Labs. Cxr.  Reviewed nursing notes and prior charts for additional history.   cxr result noted. bnp pending.  Lasix iv.  Recheck bp improved mildly 165/100.   Given dyspnea, chest pain, uncontrolled htn, chf on cxr, called med service to admit.  Reviewed prior cardiac cath, no signif cad. Trop neg. Pt cp free.       Suzi RootsKevin E Verdis Koval, MD 02/05/13 [phone]

## 2013-02-05 NOTE — ED Notes (Signed)
Contact person Daughter  Theotis Barrio [phone]

## 2013-02-06 DIAGNOSIS — N179 Acute kidney failure, unspecified: Secondary | ICD-10-CM

## 2013-02-06 DIAGNOSIS — R51 Headache: Secondary | ICD-10-CM

## 2013-02-06 LAB — GLUCOSE, CAPILLARY
GLUCOSE-CAPILLARY: 157 mg/dL — AB (ref 70–99)
Glucose-Capillary: 138 mg/dL — ABNORMAL HIGH (ref 70–99)
Glucose-Capillary: 196 mg/dL — ABNORMAL HIGH (ref 70–99)
Glucose-Capillary: 138 mg/dL — ABNORMAL HIGH (ref 70–99)

## 2013-02-06 LAB — COMPREHENSIVE METABOLIC PANEL
ALT: 76 U/L — ABNORMAL HIGH (ref 0–35)
AST: 42 U/L — ABNORMAL HIGH (ref 0–37)
Albumin: 3.6 g/dL (ref 3.5–5.2)
Alkaline Phosphatase: 58 U/L (ref 39–117)
BUN: 24 mg/dL — AB (ref 6–23)
CALCIUM: 9.5 mg/dL (ref 8.4–10.5)
CO2: 17 meq/L — AB (ref 19–32)
Chloride: 104 mEq/L (ref 96–112)
Creatinine, Ser: 1.32 mg/dL — ABNORMAL HIGH (ref 0.50–1.10)
GFR calc Af Amer: 45 mL/min — ABNORMAL LOW (ref >90)
GFR calc non Af Amer: 39 mL/min — ABNORMAL LOW (ref >90)
Glucose, Bld: 157 mg/dL — ABNORMAL HIGH (ref 70–99)
Potassium: 4.3 mEq/L (ref 3.7–5.3)
Sodium: 137 mEq/L (ref 137–147)
Total Bilirubin: 0.4 mg/dL (ref 0.3–1.2)
Total Protein: 6.5 g/dL (ref 6.0–8.3)

## 2013-02-06 MED ORDER — CARVEDILOL 3.125 MG PO TABS: 3.1250 mg | ORAL_TABLET | Freq: Two times a day (BID) | ORAL | Status: DC

## 2013-02-06 NOTE — Discharge Summary (Signed)
Patient Name:  Paula [name]  MRN: [id number]  PCP: [name], MD  DOB:  10-29-1940       Date of Admission:  02/05/2013  Date of Discharge:  02/07/2013      Attending Physician: Doneen Poisson, MD         DISCHARGE DIAGNOSES: 1.   Acute on chronic systolic heart failure 2.   DM2 (diabetes mellitus, type 2) uncontrolled 3.   HTN (hypertension) 4.   Dementia 5.   AKI on CKD, stage 3 6.   Headache   DISPOSITION AND FOLLOW-UP: SHAKENA CALLARI is to follow-up with the listed providers as detailed below, at patient's visiting, please address following issues:  1) Evaluate the patient's volume status 2) F/u with BP control and medication compliance. Coreg was added to her blood pressure regimen this admission.  3) Monitor CBG control; her Levemir was reduced on admission from 22u to 15u.   Follow-up Information   Schedule an appointment as soon as possible for a visit with [name], MD. (Please make a hospital follow appointment with your primary care physician within 1 week of your discharge from the hospital.)    Specialty:  Emergency Medicine   Contact information:   [address] [place] [address] [phone]          Discharge Orders   Future Orders Complete By Expires   (HEART FAILURE PATIENTS) Call MD:  Anytime you have any of the following symptoms: 1) 3 pound weight gain in 24 hours or 5 pounds in 1 week 2) shortness of breath, with or without a dry hacking cough 3) swelling in the hands, feet or stomach 4) if you have to sleep on extra pillows at night in order to breathe.  As directed    Call MD for:  difficulty breathing, headache or visual disturbances  As directed    Call MD for:  extreme fatigue  As directed    Call MD for:  persistant dizziness or light-headedness  As directed    Call MD for:  persistant nausea and vomiting  As directed    Call MD for:  severe uncontrolled pain  As directed    Call MD for:  temperature >100.4   As directed    Diet - low sodium heart healthy  As directed    Increase activity slowly  As directed        DISCHARGE MEDICATIONS:   Medication List    STOP taking these medications       celecoxib 200 MG capsule  Commonly known as:  CELEBREX     cephALEXin 500 MG capsule  Commonly known as:  KEFLEX      TAKE these medications       alendronate 70 MG tablet  Commonly known as:  FOSAMAX  Take 70 mg by mouth once a week. Take with a full glass of water on an empty stomach. On wednesdays     aspirin 325 MG tablet  Take 325 mg by mouth daily.     atorvastatin 40 MG tablet  Commonly known as:  LIPITOR  Take 40 mg by mouth daily.     carvedilol 3.125 MG tablet  Commonly known as:  COREG  Take 1 tablet (3.125 mg total) by mouth 2 (two) times daily with a meal.     fenofibrate 160 MG tablet  Take 160 mg by mouth daily.     Fish Oil 1200 MG Caps  Take 1 capsule  by mouth daily.     furosemide 20 MG tablet  Commonly known as:  LASIX  Take 20 mg by mouth daily.     insulin aspart 100 UNIT/ML injection  Commonly known as:  NOVOLOG  Inject 6 Units into the skin 3 (three) times daily before meals.     isosorbide mononitrate 60 MG 24 hr tablet  Commonly known as:  IMDUR  Take 60 mg by mouth daily.     LEVEMIR FLEXPEN 100 UNIT/ML Pen  Generic drug:  Insulin Detemir  Inject 15 Units into the skin daily.     lisinopril 20 MG tablet  Commonly known as:  PRINIVIL,ZESTRIL  Take 20 mg by mouth daily.     loratadine-pseudoephedrine 5-120 MG per tablet  Commonly known as:  CLARITIN-D 12-hour  Take 1 tablet by mouth 2 (two) times daily as needed for allergies.     pantoprazole 40 MG tablet  Commonly known as:  PROTONIX  Take 40 mg by mouth daily.     potassium chloride SA 20 MEQ tablet  Commonly known as:  K-DUR,KLOR-CON  Take 20 mEq by mouth daily.     traMADol 50 MG tablet  Commonly known as:  ULTRAM  Take 1 tablet (50 mg total) by mouth every 6 (six) hours as  needed. For pain         CONSULTS:    None   PROCEDURES PERFORMED:  Dg Chest 2 View  02/05/2013   CLINICAL DATA:  Chest pain A air  EXAM: CHEST  2 VIEW  COMPARISON:  Prior radiograph 12/24/2012  FINDINGS: Left-sided pacemaker again noted.  Cardiomegaly is stable.  Lungs are mildly hypoinflated. There is diffuse pulmonary vascular congestion with scattered Kerley B-lines, compatible with mild pulmonary edema. Small bilateral pleural effusions are suspected. No definite focal infiltrates. No pneumothorax.  Osseous structures are unchanged.  IMPRESSION: Cardiomegaly with mild diffuse pulmonary edema. Small bilateral pleural effusions are suspected.   Electronically Signed   By: Rise Mu M.D.   On: 02/05/2013 06:20       ADMISSION DATA: History of Present Illness:  Paula [name] is a 73 yo woman with history of nonischemic sCHF (20%, 2010) with PM/ICD, DM, HTN and h/o CVA who presents with 2-3 days of increasing SOB at rest and on exertion, as well as a dry cough for 2-3 weeks. She denies chest pain at present, but did have an episode of CP yesterday while she was preparing food in the kitchen. For the last 2 months, she has been using 5-6 pillows to sleep. She reports palpitations, as well as intermittent subjective fevers/chills. She feels lightheaded when walking, but no dizziness. She reports increased swelling in her legs, abdomen & b/l periorbital regions, and has a "so so" appetite and energy level. She has not been sleeping well secondary to her "nerves" and waking up to breathe.  Regarding her activities of daily living (dementia on her PMH, lives with daughter, daughter not present), she can shower, dress and feed self. She can go to the restroom on her own, but has urinary urge incontinence (no bowel incontinence).  Review of Systems:  HEENT: Denies photophobia, eye pain, redness, hearing loss, ear pain, congestion, sore throat, rhinorrhea, sneezing, mouth sores, trouble  swallowing, neck pain, neck stiffness and tinnitus.  Respiratory: per HPI  Cardiovascular: per HPI  Gastrointestinal: Denies nausea, vomiting, abdominal pain, diarrhea, constipation,blood in stool and abdominal distention.  Genitourinary: Denies dysuria, urgency, frequency, hematuria, flank pain and difficulty urinating.  Skin:  Denies pallor, rash and wound.  Neurological: Denies seizures, syncope, weakness, numbness and headaches.  Hematological: No obvious s/s of bleeding  Physical Exam:  Blood pressure 161/100, pulse 101, temperature 98.2 F (36.8 C), temperature source Oral, resp. rate 14, SpO2 100.00%. on RA  General: resting in bed  HEENT: PERRL, EOMI, no scleral icterus, MMM, b/l periorbital edema  Cardiac: tachycardic, no rubs, murmurs or gallops, PM on left chest, not tender, no fluctuance, no overlying erythema  Pulm: few bibasilar crackles, tachypneic during exam (RR up to 30s)  Abd: soft, nontender, nondistended, BS normoactive  Ext: warm and well perfused, b/l LE pedal edema  Neuro: alert and oriented X person, place, birthdate, day of week, year, cranial nerves II-XII grossly intact    HOSPITAL COURSE: 73 year old woman with history of sCHF, DM, uncontrolled HTN and dementia who presents with 2-3 days of worsening SOB on 02/05/13.   Acute on chronic systolic heart failure: Dyspneic, elevated BNP and pulmonary edema on CXR. Likely secondary to HTN as she is on low dose lisinopril and not on a beta blocker. She diuresed well and is feeling better today. She is able to ambulate the bathroom without becoming dyspneic. She is saturating well on room air.  - Continue BB, Nitrate, and ACEi   DM2 (diabetes mellitus, type 2): CBG controlled at admission, home regimen is Detemir 22u BID and Aspart 6u TIDWC.  During hospitalization, her determir was decreased to 15u BID, but will likely need to be uptitrated at hospital d/c appt as she will likely resume a normal, non-carb modified diet  at discharge.  - Continue 15u Levemir BID, will likely need to be uptitrated at hospital follow up.  Recent Labs Lab 02/06/13 0534 02/06/13 1107 02/06/13 1608 02/06/13 2121 02/07/13 0544  GLUCAP 138* 196* 138* 157* 146*   HTN (hypertension): Elevated at admission but improved to 140/78.  - Continue home medications at discharge with lisinopril increased to 40mg  daily  - Added Coreg 3.125mg  BID today, which was continued at d/c  Dementia: She is alert and oriented and able to answer questions appropriately prior to d/c home.   AKI on CKD3: Cr. 1.10 on admission but bumped to 1.32 on 2/6. Likely 2/2 over diuresis with Lasix. Held Lasix on 2/6 and today.  - Will need BMP at follow-up appointment to assess renal function and electrolytes.   Recent Labs Lab 02/05/13 0544 02/06/13 0304  CREATININE 1.10 1.32*   Headache: Resolved. Appears chronic as she states it is no different from her usual headaches. No temporal tenderness. No change in vision. Tylenol provided PRN.   DISCHARGE DATA: Vital Signs: BP 144/90  Pulse 93  Temp(Src) 97.6 F (36.4 C) (Oral)  Resp 18  Ht 5\' 5"  (1.651 m)  Wt 181 lb (82.1 kg)  BMI 30.12 kg/m2  SpO2 100%  Labs: Results for orders placed during the hospital encounter of 02/05/13 (from the past 24 hour(s))  GLUCOSE, CAPILLARY     Status: Abnormal   Collection Time    02/06/13  9:21 PM      Result Value Range   Glucose-Capillary 157 (*) 70 - 99 mg/dL  GLUCOSE, CAPILLARY     Status: Abnormal   Collection Time    02/07/13  5:44 AM      Result Value Range   Glucose-Capillary 146 (*) 70 - 99 mg/dL     Services Ordered on Discharge: Y = Yes; Blank = No PT:   OT:   RN:  Equipment:   Other:      Time Spent on Discharge: 35 min   Signed: Genelle GatherGlenn, Keanu Lesniak F, MD Internal Medicine Resident, PGY II [facility] 02/07/2013 4:28 PM

## 2013-02-06 NOTE — Progress Notes (Signed)
Pt with high BP 135/106 c/o headache, Tylenol PO given for headache no PRN meds for BP ordered at this time. Internal med paged and notified of BP elevated.

## 2013-02-06 NOTE — Progress Notes (Signed)
Pt placed back on tele.  Verified with Daryl MS.  Stated tele in place.  Will continue to monitor.  Amanda Pea, Charity fundraiser.

## 2013-02-06 NOTE — Progress Notes (Signed)
Internal Medicine Attending  Date: 02/06/2013  Patient name: Paula [name] Medical record number: [id number] Date of birth: 1940/09/26 Age: 73 y.o. Gender: female  I saw and evaluated the patient. I reviewed the resident's note by Dr. Andrey Campanile and I agree with the resident's findings and plans as documented in her progress note.  [name] is much improved this morning from a breathing standpoint.  In fact, she feels so much improved she would like to go home.  I feel she is stable for discharge home.  We increased her lisinopril and started Coreg to maximize her heart failure regimen and reduce her afterload.  She will need outpatient follow-up with her PCP in 7-10 days to reassess her clinically and recheck a BNP.

## 2013-02-06 NOTE — Progress Notes (Signed)
Subjective: Paula [name] was seen and examined by me this morning.  She is feeling better, able to breath well on RA.  She feels ready to go home.   Later on in the day she complained of headache.  She says she has chronic headaches that [facility].  This headache feels the same and is located just above her left eye.  She denies change or loss of vision, N/V or diarrhea.  Her appetite has not changed and she has partially eaten meal tray at bedside.  She is able to ambulate to the bathroom unassisted and without becoming dyspneic.    Objective: Vital signs in last 24 hours: Filed Vitals:   02/06/13 1004 02/06/13 1500 02/06/13 1540 02/06/13 1731  BP: 145/91 140/78    Pulse:  90    Temp:  98 F (36.7 C)    TempSrc:  Oral    Resp: 18 18    Height:      Weight:      SpO2: 96% 96% 93% 97%   Weight change:   Intake/Output Summary (Last 24 hours) at 02/06/13 1753 Last data filed at 02/06/13 1500  Gross per 24 hour  Intake    938 ml  Output   1350 ml  Net   -412 ml   General: resting in bed in NAD HEENT: PERRL, EOMI, visual fields full Cardiac: RRR, no rubs, murmurs or gallops Pulm: clear to auscultation bilaterally, moving normal volumes of air Abd: soft, nontender, nondistended, BS present Ext: warm and well perfused, no pedal edema Neuro: alert and oriented X3, mentating appropriately  Lab Results: Basic Metabolic Panel:  Recent Labs Lab 02/05/13 0544 02/06/13 0304  NA 142 137  K 4.1 4.3  CL 108 104  CO2 20 17*  GLUCOSE 116* 157*  BUN 18 24*  CREATININE 1.10 1.32*  CALCIUM 9.7 9.5   Liver Function Tests:  Recent Labs Lab 02/05/13 0544 02/06/13 0304  AST 47* 42*  ALT 76* 76*  ALKPHOS 59 58  BILITOT 0.3 0.4  PROT 6.5 6.5  ALBUMIN 3.5 3.6   CBC:  Recent Labs Lab 02/05/13 0544  WBC 6.0  HGB 11.4*  HCT 36.1  MCV 90.0  PLT 228   Cardiac Enzymes:  Recent Labs Lab 02/05/13 0544  TROPONINI <0.30    BNP:  Recent Labs Lab 02/05/13 0544  PROBNP 4211.0*   CBG:  Recent Labs Lab 02/05/13 1108 02/05/13 1620 02/05/13 2123 02/06/13 0534 02/06/13 1107 02/06/13 1608  GLUCAP 133* 318* 86 138* 196* 138*   Studies/Results: Dg Chest 2 View  02/05/2013   CLINICAL DATA:  Chest pain A air  EXAM: CHEST  2 VIEW  COMPARISON:  Prior radiograph 12/24/2012  FINDINGS: Left-sided pacemaker again noted.  Cardiomegaly is stable.  Lungs are mildly hypoinflated. There is diffuse pulmonary vascular congestion with scattered Kerley B-lines, compatible with mild pulmonary edema. Small bilateral pleural effusions are suspected. No definite focal infiltrates. No pneumothorax.  Osseous structures are unchanged.  IMPRESSION: Cardiomegaly with mild diffuse pulmonary edema. Small bilateral pleural effusions are suspected.   Electronically Signed   By: Rise MuBenjamin  McClintock M.D.   On: 02/05/2013 06:20   Medications: I have reviewed the patient's current medications. Scheduled Meds: . aspirin  325 mg Oral Daily  . atorvastatin  40 mg Oral Daily  . heparin  5,000 Units Subcutaneous Q8H  . insulin aspart  0-20 Units Subcutaneous TID WC  . insulin detemir  10 Units  Subcutaneous BID  . isosorbide mononitrate  60 mg Oral Daily  . lisinopril  40 mg Oral Daily  . pantoprazole  40 mg Oral Daily  . sodium chloride  3 mL Intravenous Q12H  . sodium chloride  3 mL Intravenous Q12H   Continuous Infusions:  PRN Meds:.sodium chloride, acetaminophen, acetaminophen, sodium chloride  Assessment/Plan: 73 year old woman with history of sCHF, DM, uncontrolled HTN and dementia who presents with 2-3 days of worsening SOB on 02/05/13.   Acute on chronic systolic heart failure:  Dyspneic, elevated BNP and pulmonary edema on CXR.  Likely secondary to HTN as she is on low dose lisinopril and not on a beta blocker.  She diuresed well and is feeling better today.  She is able to ambulate the bathroom without becoming dyspneic.  She is  satting well on room air.    DM2 (diabetes mellitus, type 2): CBG controlled at admission, home regimen is Detemir 22u BID and Aspart 6u TIDWC.  During hospitalization, decrease determir to 10u BID and BGs 80s-300.  Will discharge on decreased dose of 16 units BID as she will likely resume a normal (non-carb modified diet) at discharge.  HTN (hypertension): Elevated at admission but improved to 140/78.  - continue home medications at discharge with lisinopril increased to 40mg  daily - add Coreg 3.125mg  BID at discharge  Dementia: She is alert and oriented and able to answer questions appropriately.    AKI on CKD3: Cr. 1.10  At admission but bumped to 1.32 overnight.  Likely due to Lasix.  Held Lasix today.  Will need BMP at follow-up appointment.   Headache:  Appears chronic as she states it is no different from her usual headaches.  No temporal tenderness.  No change in vision.  Tylenol provided.    Dispo: She is clinically stable for discharge home.  She should follow-up with Dr. Margretta Ditty in 1 week.  I will call his office to schedule the appointment for her.  The patient does have a current PCP [name], MD) and does need an [facility] hospital follow-up appointment after discharge.  The patient does not know have transportation limitations that hinder transportation to clinic appointments.  .Services Needed at time of discharge: Y = Yes, Blank = No PT:   OT:   RN:   Equipment:   Other:     LOS: 1 day   Evelena Peat, DO 02/06/2013, 5:53 PM

## 2013-02-06 NOTE — Discharge Instructions (Signed)
1. Please follow-up with your primary care doctor, Dr. Margretta Ditty in 1 week.   2. Please take all medications as prescribed.    3. If you have worsening of your symptoms or new symptoms arise, please call the clinic ([phone]), or go to the ER immediately if symptoms are severe.

## 2013-02-06 NOTE — Progress Notes (Signed)
Paged MD to make aware of BP 152/108-96-20 02 sat 96 % on RA denies headache alert/oriented. Has a tight cough -breath sounds clear with diminished bases. Dr Andrey Campanile returned page & notified says she will review meds make recommendations and or place orders.

## 2013-02-06 NOTE — Progress Notes (Signed)
Inpatient Diabetes Program Recommendations  AACE/ADA: New Consensus Statement on Inpatient Glycemic Control (2013)  Target Ranges:  Prepandial:   less than 140 mg/dL      Peak postprandial:   less than 180 mg/dL (1-2 hours)      Critically ill patients:  140 - 180 mg/dL   Reason for Visit:Hyperglycemia  Diabetes history: yes Outpatient Diabetes medications: Levemir 15 units daily and Novolog 6 units TID. Current orders for Inpatient glycemic control: Levemir 10 units BID and Novolog RESISTANT correction scale TID.   Note: Will continue to follow while in hospital.  If CBGs continue to increase and are above 180 mg/dl, may need to increase dosage. Smith Mince RN BSN CDE

## 2013-02-06 NOTE — Progress Notes (Signed)
DR Andrey Campanile returned call wants Lisinopril given ASAP notified for med from pharmacy

## 2013-02-07 LAB — GLUCOSE, CAPILLARY: Glucose-Capillary: 146 mg/dL — ABNORMAL HIGH (ref 70–99)

## 2013-02-07 MED ORDER — INSULIN DETEMIR 100 UNIT/ML ~~LOC~~ SOLN
12.0000 [IU] | Freq: Two times a day (BID) | SUBCUTANEOUS | Status: DC
  Administered 2013-02-07: 12 [IU] via SUBCUTANEOUS
  Filled 2013-02-07 (×2): qty 0.12

## 2013-02-07 MED ORDER — INSULIN DETEMIR 100 UNIT/ML ~~LOC~~ SOLN
15.0000 [IU] | Freq: Two times a day (BID) | SUBCUTANEOUS | Status: DC
  Filled 2013-02-07: qty 0.15

## 2013-02-07 MED ORDER — CARVEDILOL 3.125 MG PO TABS
3.1250 mg | ORAL_TABLET | Freq: Two times a day (BID) | ORAL | Status: DC
  Administered 2013-02-07: 3.125 mg via ORAL
  Filled 2013-02-07 (×3): qty 1

## 2013-02-07 NOTE — Progress Notes (Addendum)
Subjective: She is feeling better, able to breath well on RA.  She feels ready to go home.   Objective: Vital signs in last 24 hours: Filed Vitals:   02/06/13 1731 02/06/13 2120 02/07/13 0619 02/07/13 0933  BP:  141/88 153/96 144/90  Pulse:  86 94 93  Temp:  97.8 F (36.6 C) 98 F (36.7 C) 97.6 F (36.4 C)  TempSrc:  Oral Oral Oral  Resp:  18 16 18   Height:      Weight:   181 lb (82.1 kg)   SpO2: 97% 99% 100% 100%   Weight change: 10.6 oz (0.3 kg)  Intake/Output Summary (Last 24 hours) at 02/07/13 1103 Last data filed at 02/07/13 0841  Gross per 24 hour  Intake    958 ml  Output   1700 ml  Net   -742 ml   General: resting in bed in NAD HEENT: PERRL, EOMI Cardiac: RRR, no rubs, murmurs or gallops Pulm: clear to auscultation bilaterally, moving normal volumes of air Abd: soft, nontender, nondistended, BS present Ext: warm and well perfused Neuro: alert and oriented X3, mentating appropriately  Lab Results: Basic Metabolic Panel:  Recent Labs Lab 02/05/13 0544 02/06/13 0304  NA 142 137  K 4.1 4.3  CL 108 104  CO2 20 17*  GLUCOSE 116* 157*  BUN 18 24*  CREATININE 1.10 1.32*  CALCIUM 9.7 9.5   Liver Function Tests:  Recent Labs Lab 02/05/13 0544 02/06/13 0304  AST 47* 42*  ALT 76* 76*  ALKPHOS 59 58  BILITOT 0.3 0.4  PROT 6.5 6.5  ALBUMIN 3.5 3.6   CBC:  Recent Labs Lab 02/05/13 0544  WBC 6.0  HGB 11.4*  HCT 36.1  MCV 90.0  PLT 228   Cardiac Enzymes:  Recent Labs Lab 02/05/13 0544  TROPONINI <0.30   BNP:  Recent Labs Lab 02/05/13 0544  PROBNP 4211.0*   CBG:  Recent Labs Lab 02/05/13 2123 02/06/13 0534 02/06/13 1107 02/06/13 1608 02/06/13 2121 02/07/13 0544  GLUCAP 86 138* 196* 138* 157* 146*   Studies/Results: No results found.  Medications: I have reviewed the patient's current medications. Scheduled Meds: . aspirin  325 mg Oral Daily  . atorvastatin  40 mg Oral Daily  . carvedilol  3.125 mg Oral BID WC  .  heparin  5,000 Units Subcutaneous Q8H  . insulin aspart  0-20 Units Subcutaneous TID WC  . insulin detemir  12 Units Subcutaneous BID  . isosorbide mononitrate  60 mg Oral Daily  . lisinopril  40 mg Oral Daily  . pantoprazole  40 mg Oral Daily  . sodium chloride  3 mL Intravenous Q12H  . sodium chloride  3 mL Intravenous Q12H   Continuous Infusions:  PRN Meds:.sodium chloride, acetaminophen, acetaminophen, sodium chloride  Assessment/Plan: 73 year old woman with history of sCHF, DM, uncontrolled HTN and dementia who presents with 2-3 days of worsening SOB on 02/05/13.   Acute on chronic systolic heart failure:  Dyspneic, elevated BNP and pulmonary edema on CXR.  Likely secondary to HTN as she is on low dose lisinopril and not on a beta blocker.  She diuresed well and is feeling better today.  She is able to ambulate the bathroom without becoming dyspneic.  She is satting well on room air.   - Continue BB, Nitrate, and ACEi   DM2 (diabetes mellitus, type 2): CBG controlled at admission, home regimen is Detemir 22u BID and Aspart 6u TIDWC.  During hospitalization, her determir was decreased  to 15u BID, but will likely need to be uptitrated at hospital d/c appt as she will likely resume a normal, non-carb modified diet at discharge.  Recent Labs Lab 02/06/13 0534 02/06/13 1107 02/06/13 1608 02/06/13 2121 02/07/13 0544  GLUCAP 138* 196* 138* 157* 146*   HTN (hypertension): Elevated at admission but improved to 140/78.  - Continue home medications at discharge with lisinopril increased to 40mg  daily - Adding Coreg 3.125mg  BID today  Dementia: She is alert and oriented and able to answer questions appropriately.    AKI on CKD3: Cr. 1.10 on admission but bumped to 1.32 on 2/6.  Likely 2/2 over diuresis with Lasix.  Held Lasix 2/6 and today.  Will need BMP at follow-up appointment to assess renal function and electrolytes.  Recent Labs Lab 02/05/13 0544 02/06/13 0304  CREATININE 1.10  1.32*    Headache:  Resolved. Appears chronic as she states it is no different from her usual headaches.  No temporal tenderness.  No change in vision.  Tylenol provided PRN    Dispo: She is clinically stable for discharge home.  She should follow-up with Dr. Margretta Ditty in 1 week.   The patient does have a current PCP [name], MD) and does not need an [facility] hospital follow-up appointment after discharge.  The patient does not know have transportation limitations that hinder transportation to clinic appointments.  .Services Needed at time of discharge: Y = Yes, Blank = No PT:   OT:   RN:   Equipment:   Other:     LOS: 2 days   Genelle Gather, MD 02/07/2013, 11:03 AM

## 2013-02-07 NOTE — Progress Notes (Signed)
Utilization Review completed.  

## 2013-02-08 ENCOUNTER — Other Ambulatory Visit: Payer: Self-pay | Admitting: Internal Medicine

## 2013-02-08 MED ORDER — LISINOPRIL 40 MG PO TABS: 20.0000 mg | ORAL_TABLET | Freq: Every day | ORAL | Status: DC

## 2013-02-08 NOTE — Discharge Summary (Signed)
Please note the lisinopril dose at discharge is 40 mg by mouth daily.  Will make sure the patient/daughter know this and they have 40 mg tablets available.

## 2013-02-09 ENCOUNTER — Emergency Department (HOSPITAL_COMMUNITY): Payer: Medicare Other

## 2013-02-09 ENCOUNTER — Encounter (HOSPITAL_COMMUNITY): Payer: Self-pay | Admitting: *Deleted

## 2013-02-09 ENCOUNTER — Inpatient Hospital Stay (HOSPITAL_COMMUNITY)
Admission: EM | Admit: 2013-02-09 | Discharge: 2013-02-14 | DRG: 064 | Disposition: A | Discharge status: Home / Self Care | Payer: Medicare Other | Attending: Internal Medicine | Admitting: Internal Medicine

## 2013-02-09 DIAGNOSIS — N183 Chronic kidney disease, stage 3 unspecified: Secondary | ICD-10-CM | POA: Diagnosis present

## 2013-02-09 DIAGNOSIS — R079 Chest pain, unspecified: Secondary | ICD-10-CM

## 2013-02-09 DIAGNOSIS — E119 Type 2 diabetes mellitus without complications: Secondary | ICD-10-CM

## 2013-02-09 DIAGNOSIS — Z794 Long term (current) use of insulin: Secondary | ICD-10-CM

## 2013-02-09 DIAGNOSIS — G9340 Encephalopathy, unspecified: Secondary | ICD-10-CM | POA: Diagnosis present

## 2013-02-09 DIAGNOSIS — E1169 Type 2 diabetes mellitus with other specified complication: Secondary | ICD-10-CM

## 2013-02-09 DIAGNOSIS — I509 Heart failure, unspecified: Secondary | ICD-10-CM | POA: Diagnosis present

## 2013-02-09 DIAGNOSIS — I129 Hypertensive chronic kidney disease with stage 1 through stage 4 chronic kidney disease, or unspecified chronic kidney disease: Secondary | ICD-10-CM | POA: Diagnosis present

## 2013-02-09 DIAGNOSIS — F17200 Nicotine dependence, unspecified, uncomplicated: Secondary | ICD-10-CM | POA: Diagnosis present

## 2013-02-09 DIAGNOSIS — F0390 Unspecified dementia without behavioral disturbance: Secondary | ICD-10-CM | POA: Diagnosis present

## 2013-02-09 DIAGNOSIS — I5023 Acute on chronic systolic (congestive) heart failure: Secondary | ICD-10-CM

## 2013-02-09 DIAGNOSIS — I639 Cerebral infarction, unspecified: Secondary | ICD-10-CM | POA: Diagnosis present

## 2013-02-09 DIAGNOSIS — R4789 Other speech disturbances: Secondary | ICD-10-CM | POA: Diagnosis present

## 2013-02-09 DIAGNOSIS — I6350 Cerebral infarction due to unspecified occlusion or stenosis of unspecified cerebral artery: Principal | ICD-10-CM | POA: Diagnosis present

## 2013-02-09 DIAGNOSIS — I5043 Acute on chronic combined systolic (congestive) and diastolic (congestive) heart failure: Secondary | ICD-10-CM | POA: Diagnosis present

## 2013-02-09 DIAGNOSIS — Z79899 Other long term (current) drug therapy: Secondary | ICD-10-CM

## 2013-02-09 DIAGNOSIS — IMO0001 Reserved for inherently not codable concepts without codable children: Secondary | ICD-10-CM

## 2013-02-09 DIAGNOSIS — Z950 Presence of cardiac pacemaker: Secondary | ICD-10-CM

## 2013-02-09 DIAGNOSIS — I428 Other cardiomyopathies: Secondary | ICD-10-CM | POA: Diagnosis present

## 2013-02-09 DIAGNOSIS — E162 Hypoglycemia, unspecified: Secondary | ICD-10-CM | POA: Diagnosis present

## 2013-02-09 DIAGNOSIS — IMO0002 Reserved for concepts with insufficient information to code with codable children: Secondary | ICD-10-CM | POA: Diagnosis present

## 2013-02-09 DIAGNOSIS — Z8673 Personal history of transient ischemic attack (TIA), and cerebral infarction without residual deficits: Secondary | ICD-10-CM

## 2013-02-09 DIAGNOSIS — Z7982 Long term (current) use of aspirin: Secondary | ICD-10-CM

## 2013-02-09 DIAGNOSIS — I10 Essential (primary) hypertension: Secondary | ICD-10-CM | POA: Diagnosis present

## 2013-02-09 DIAGNOSIS — E785 Hyperlipidemia, unspecified: Secondary | ICD-10-CM | POA: Diagnosis present

## 2013-02-09 DIAGNOSIS — E1165 Type 2 diabetes mellitus with hyperglycemia: Secondary | ICD-10-CM | POA: Diagnosis present

## 2013-02-09 LAB — URINALYSIS, ROUTINE W REFLEX MICROSCOPIC
Bilirubin Urine: NEGATIVE
Glucose, UA: NEGATIVE mg/dL
Hgb urine dipstick: NEGATIVE
Ketones, ur: NEGATIVE mg/dL
LEUKOCYTES UA: NEGATIVE
NITRITE: NEGATIVE
PH: 5 (ref 5.0–8.0)
Specific Gravity, Urine: 1.03 (ref 1.005–1.030)
Urobilinogen, UA: 1 mg/dL (ref 0.0–1.0)

## 2013-02-09 LAB — POCT I-STAT TROPONIN I: Troponin i, poc: 0.07 ng/mL (ref 0.00–0.08)

## 2013-02-09 LAB — PRO B NATRIURETIC PEPTIDE: PRO B NATRI PEPTIDE: 5031 pg/mL — AB (ref 0–125)

## 2013-02-09 LAB — CBC WITH DIFFERENTIAL/PLATELET
BASOS ABS: 0.1 10*3/uL (ref 0.0–0.1)
Basophils Relative: 1 % (ref 0–1)
Eosinophils Absolute: 0 10*3/uL (ref 0.0–0.7)
Eosinophils Relative: 0 % (ref 0–5)
HEMATOCRIT: 34 % — AB (ref 36.0–46.0)
HEMOGLOBIN: 11.1 g/dL — AB (ref 12.0–15.0)
LYMPHS ABS: 3.2 10*3/uL (ref 0.7–4.0)
LYMPHS PCT: 37 % (ref 12–46)
MCH: 29.2 pg (ref 26.0–34.0)
MCHC: 32.6 g/dL (ref 30.0–36.0)
MCV: 89.5 fL (ref 78.0–100.0)
MONOS PCT: 13 % — AB (ref 3–12)
Monocytes Absolute: 1.1 10*3/uL — ABNORMAL HIGH (ref 0.1–1.0)
Neutro Abs: 4.2 10*3/uL (ref 1.7–7.7)
Neutrophils Relative %: 49 % (ref 43–77)
Platelets: 286 10*3/uL (ref 150–400)
RBC: 3.8 MIL/uL — ABNORMAL LOW (ref 3.87–5.11)
RDW: 17.3 % — AB (ref 11.5–15.5)
WBC: 8.6 10*3/uL (ref 4.0–10.5)

## 2013-02-09 LAB — GLUCOSE, CAPILLARY
Glucose-Capillary: 72 mg/dL (ref 70–99)
Glucose-Capillary: 123 mg/dL — ABNORMAL HIGH (ref 70–99)

## 2013-02-09 LAB — URINALYSIS W MICROSCOPIC + REFLEX CULTURE
Bilirubin Urine: NEGATIVE
GLUCOSE, UA: NEGATIVE mg/dL
HGB URINE DIPSTICK: NEGATIVE
Ketones, ur: NEGATIVE mg/dL
Leukocytes, UA: NEGATIVE
Nitrite: NEGATIVE
Protein, ur: 100 mg/dL — AB
SPECIFIC GRAVITY, URINE: 1.018 (ref 1.005–1.030)
UROBILINOGEN UA: 1 mg/dL (ref 0.0–1.0)
pH: 5 (ref 5.0–8.0)

## 2013-02-09 LAB — BASIC METABOLIC PANEL
BUN: 25 mg/dL — ABNORMAL HIGH (ref 6–23)
CALCIUM: 9.6 mg/dL (ref 8.4–10.5)
CO2: 20 meq/L (ref 19–32)
Chloride: 104 mEq/L (ref 96–112)
Creatinine, Ser: 1.1 mg/dL (ref 0.50–1.10)
GFR calc Af Amer: 56 mL/min — ABNORMAL LOW (ref >90)
GFR, EST NON AFRICAN AMERICAN: 49 mL/min — AB (ref >90)
Glucose, Bld: 65 mg/dL — ABNORMAL LOW (ref 70–99)
POTASSIUM: 4.7 meq/L (ref 3.7–5.3)
SODIUM: 139 meq/L (ref 137–147)

## 2013-02-09 LAB — RAPID URINE DRUG SCREEN, HOSP PERFORMED
Amphetamines: NOT DETECTED
BARBITURATES: NOT DETECTED
Benzodiazepines: NOT DETECTED
COCAINE: NOT DETECTED
OPIATES: NOT DETECTED
Tetrahydrocannabinol: NOT DETECTED

## 2013-02-09 LAB — URINE MICROSCOPIC-ADD ON

## 2013-02-09 LAB — PROTIME-INR
INR: 1.09 (ref 0.00–1.49)
Prothrombin Time: 13.9 seconds (ref 11.6–15.2)

## 2013-02-09 LAB — APTT: aPTT: 30 seconds (ref 24–37)

## 2013-02-09 MED ORDER — LISINOPRIL 20 MG PO TABS
20.0000 mg | ORAL_TABLET | Freq: Every day | ORAL | Status: DC
  Administered 2013-02-09 – 2013-02-14 (×6): 20 mg via ORAL
  Filled 2013-02-09 (×6): qty 1

## 2013-02-09 MED ORDER — CLOPIDOGREL BISULFATE 75 MG PO TABS
75.0000 mg | ORAL_TABLET | Freq: Every day | ORAL | Status: DC
  Administered 2013-02-10 – 2013-02-11 (×2): 75 mg via ORAL
  Filled 2013-02-09 (×3): qty 1

## 2013-02-09 MED ORDER — FUROSEMIDE 10 MG/ML IJ SOLN
40.0000 mg | Freq: Every day | INTRAMUSCULAR | Status: DC
  Administered 2013-02-09 – 2013-02-11 (×3): 40 mg via INTRAVENOUS
  Filled 2013-02-09 (×3): qty 4

## 2013-02-09 MED ORDER — FISH OIL 1200 MG PO CAPS: 1.0000 | ORAL_CAPSULE | Freq: Every day | ORAL | Status: DC

## 2013-02-09 MED ORDER — POTASSIUM CHLORIDE CRYS ER 20 MEQ PO TBCR
20.0000 meq | EXTENDED_RELEASE_TABLET | Freq: Every day | ORAL | Status: DC
  Administered 2013-02-09 – 2013-02-11 (×3): 20 meq via ORAL
  Filled 2013-02-09 (×3): qty 1

## 2013-02-09 MED ORDER — CARVEDILOL 3.125 MG PO TABS
3.1250 mg | ORAL_TABLET | Freq: Two times a day (BID) | ORAL | Status: DC
  Administered 2013-02-10 – 2013-02-14 (×9): 3.125 mg via ORAL
  Filled 2013-02-09 (×11): qty 1

## 2013-02-09 MED ORDER — ACETAMINOPHEN 650 MG RE SUPP: 650.0000 mg | RECTAL | Status: DC | PRN

## 2013-02-09 MED ORDER — SENNOSIDES-DOCUSATE SODIUM 8.6-50 MG PO TABS: 1.0000 | ORAL_TABLET | Freq: Every evening | ORAL | Status: DC | PRN

## 2013-02-09 MED ORDER — INSULIN ASPART 100 UNIT/ML ~~LOC~~ SOLN
0.0000 [IU] | Freq: Three times a day (TID) | SUBCUTANEOUS | Status: DC
  Administered 2013-02-10: 2 [IU] via SUBCUTANEOUS
  Administered 2013-02-10: 3 [IU] via SUBCUTANEOUS
  Administered 2013-02-11 (×2): 2 [IU] via SUBCUTANEOUS
  Administered 2013-02-11: 3 [IU] via SUBCUTANEOUS
  Administered 2013-02-12 (×2): 2 [IU] via SUBCUTANEOUS
  Administered 2013-02-12: 3 [IU] via SUBCUTANEOUS
  Administered 2013-02-13 – 2013-02-14 (×5): 2 [IU] via SUBCUTANEOUS

## 2013-02-09 MED ORDER — ENOXAPARIN SODIUM 40 MG/0.4ML ~~LOC~~ SOLN
40.0000 mg | SUBCUTANEOUS | Status: DC
  Administered 2013-02-09 – 2013-02-13 (×5): 40 mg via SUBCUTANEOUS
  Filled 2013-02-09 (×6): qty 0.4

## 2013-02-09 MED ORDER — NITROGLYCERIN 0.4 MG SL SUBL: 0.4000 mg | SUBLINGUAL_TABLET | SUBLINGUAL | Status: DC | PRN

## 2013-02-09 MED ORDER — ISOSORBIDE MONONITRATE ER 60 MG PO TB24
60.0000 mg | ORAL_TABLET | Freq: Every day | ORAL | Status: DC
  Administered 2013-02-09 – 2013-02-14 (×6): 60 mg via ORAL
  Filled 2013-02-09 (×6): qty 1

## 2013-02-09 MED ORDER — ATORVASTATIN CALCIUM 40 MG PO TABS
40.0000 mg | ORAL_TABLET | Freq: Every day | ORAL | Status: DC
  Administered 2013-02-09 – 2013-02-14 (×6): 40 mg via ORAL
  Filled 2013-02-09 (×6): qty 1

## 2013-02-09 MED ORDER — ACETAMINOPHEN 325 MG PO TABS
650.0000 mg | ORAL_TABLET | ORAL | Status: DC | PRN
Start: 2013-02-09 — End: 2013-02-14
  Administered 2013-02-13: 650 mg via ORAL
  Filled 2013-02-09: qty 2

## 2013-02-09 MED ORDER — ASPIRIN 81 MG PO CHEW
324.0000 mg | CHEWABLE_TABLET | Freq: Once | ORAL | Status: AC
  Administered 2013-02-09: 324 mg via ORAL
  Filled 2013-02-09: qty 4

## 2013-02-09 MED ORDER — OMEGA-3-ACID ETHYL ESTERS 1 G PO CAPS
1.0000 g | ORAL_CAPSULE | Freq: Every day | ORAL | Status: DC
  Administered 2013-02-10 – 2013-02-14 (×5): 1 g via ORAL
  Filled 2013-02-09 (×5): qty 1

## 2013-02-09 MED ORDER — FENOFIBRATE 160 MG PO TABS
160.0000 mg | ORAL_TABLET | Freq: Every day | ORAL | Status: DC
  Administered 2013-02-09 – 2013-02-14 (×6): 160 mg via ORAL
  Filled 2013-02-09 (×6): qty 1

## 2013-02-09 NOTE — H&P (Signed)
[facility] History and Physical  Paula [name]:[id number] DOB: Dec 09, 1940 DOA: 02/09/2013   PCP: [name], MD   Chief Complaint:  Chest pain, shortness of breath, mental status change HPI:  73 year old female with a history of combined systolic and diastolic heart failure, hypertension, stroke, nonischemic cardiomyopathy presents with a two-day history of chest pain and shortness of breath. The patient is a poor historian. She is unable to clarify in any detail the quality, duration, or circumstances surrounding her chest pain. She was able to tell me that her chest pain has been occurring at rest. The patient's daughter is at the bedside, but unfortunately she is unable to provide any significant history to assist. The patient is unable to provide any clearity regarding the circumstances surrounding her shortness of breath other than the fact that her breathing has improved since arrival to the emergency department. Her daughter was able to tell me that the patient is a little more confused than usual, but she has not seen the patient for over one month. The patient has not able to tell me if she has been taking her medications, but the patient's daughter states that her home health aide helps g the patient's medications to take on a daily basis. The patient was recently discharged from the hospital on 02/07/2013 after a short stay for acute on chronic CHF. Currently, the patient is chest pain-free and denies any shortness of breath, nausea, vomiting, dizziness, abdominal pain, dysuria, hematuria.  In the emergency department, workup including CT of the brain revealed an area of concern for acute infarction in the right cerebellum. Chest x-ray showed interstitial prominence with bibasilar atelectasis. The patient was afebrile and hemodynamically stable. She was given 4 baby aspirin in the ED. BMP showed serum creatinine 1.10. Serum glucose was 65. ProBNP was 5031. A stat troponin  was 0.07. Assessment/Plan: Acute encephalopathy -Multifactorial including stroke, hypoglycemia, tramadol, and decompensated heart failure -Discontinue tramadol -Urinalysis with reflex to urine culture -UDS Abnormal CT brain -Concerned about acute stroke -Neurology has been consulted -Cannot obtain MRI secondary to patient's AICD - echocardiogram  -Carotid duplex  -Lipid panel  -Start Plavix  acute on chronic combined systolic and diastolic CHF -ProBNP 5031 -The patient is clinically volume overloaded -She does not weigh herself daily at home -Start furosemide 40 mg IV daily -Daily weights -Fluid restrict Atypical chest pain  -Cycle troponins  -EKG was unchanged left bundle branch block  Diabetes mellitus type 2, poorly controlled  -12/24/2012 hemoglobin A1c 12.7  -Will off on Levemir at this time as the patient is hypoglycemic  -She is unable to clarify for me when she last had insulin and what type  -NovoLog sliding scale  -serum glucose was 65 on BMP Hypertension  - continue lisinopril and carvedilol  -Continue Imdur        Past Medical History  Diagnosis Date  . Systolic CHF     Non ischemic, last Cath 09/21/08 - normal left main, LAD, LCx, ramus intermedius, RCA  . Hypertension   . Stroke   . Headache(784.0)   . Diabetes mellitus   . Dementia 05/27/2012  . Osteopenia     DEXA 2012 : T score hip -2.3, femur -2.1  . CHF (congestive heart failure)   . Shortness of breath   . Pacemaker    Past Surgical History  Procedure Laterality Date  . Insert / replace / remove pacemaker  12/15/2010  . Back surgery    . Abdominal hysterectomy  Social History:  reports that she has been smoking Cigarettes.  She has been smoking about 0.00 packs per day. She has never used smokeless tobacco. She reports that she does not drink alcohol or use illicit drugs.  Family History Very limited and essentially  unobtainable secondary to patient's mental confusion  No Known  Allergies    Prior to Admission medications   Medication Sig Start Date End Date Taking? Authorizing Provider  alendronate (FOSAMAX) 70 MG tablet Take 70 mg by mouth once a week. Take with a full glass of water on an empty stomach. On wednesdays   Yes Historical Provider, MD  aspirin 325 MG tablet Take 325 mg by mouth daily.   Yes Historical Provider, MD  atorvastatin (LIPITOR) 40 MG tablet Take 40 mg by mouth daily.   Yes Historical Provider, MD  carvedilol (COREG) 3.125 MG tablet Take 1 tablet (3.125 mg total) by mouth 2 (two) times daily with a meal. 02/06/13  Yes Evelena PeatAlex Wilson, DO  fenofibrate 160 MG tablet Take 160 mg by mouth daily.   Yes Historical Provider, MD  furosemide (LASIX) 20 MG tablet Take 20 mg by mouth daily.   Yes Historical Provider, MD  insulin aspart (NOVOLOG) 100 UNIT/ML injection Inject 6 Units into the skin 3 (three) times daily before meals. 12/26/12  Yes Lonia BloodJeffrey T McClung, MD  Insulin Detemir (LEVEMIR FLEXPEN) 100 UNIT/ML Pen Inject 15 Units into the skin daily.   Yes Historical Provider, MD  isosorbide mononitrate (IMDUR) 60 MG 24 hr tablet Take 60 mg by mouth daily.   Yes Historical Provider, MD  lisinopril (PRINIVIL,ZESTRIL) 40 MG tablet Take 0.5 tablets (20 mg total) by mouth daily. 02/08/13  Yes Genelle GatherKathryn F Glenn, MD  loratadine-pseudoephedrine (CLARITIN-D 12-HOUR) 5-120 MG per tablet Take 1 tablet by mouth 2 (two) times daily as needed for allergies.   Yes Historical Provider, MD  Omega-3 Fatty Acids (FISH OIL) 1200 MG CAPS Take 1 capsule by mouth daily.   Yes Historical Provider, MD  pantoprazole (PROTONIX) 40 MG tablet Take 40 mg by mouth daily.   Yes Historical Provider, MD  potassium chloride SA (K-DUR,KLOR-CON) 20 MEQ tablet Take 20 mEq by mouth daily.   Yes Historical Provider, MD  traMADol (ULTRAM) 50 MG tablet Take 1 tablet (50 mg total) by mouth every 6 (six) hours as needed. For pain 07/10/12  Yes Tiffany L Reed, DO    Review of Systems:  Very limited and  essentially unobtainable secondary to patient's mental confusion   Physical Exam: Filed Vitals:   02/09/13 1202 02/09/13 1326 02/09/13 1629  BP: 150/100 151/104 152/109  Pulse: 80 85 81  Temp: 98 F (36.7 C) 98.2 F (36.8 C)   TempSrc: Oral Oral   Resp: 19 26 17   SpO2: 98% 100% 100%   General:  Alert and awake, NAD, nontoxic, pleasant/cooperative Head/Eye: No conjunctival hemorrhage, no icterus, [place]/AT, No nystagmus ENT:  No icterus,  No thrush, , no pharyngeal exudate Neck:  No masses, no lymphadenpathy, no bruits CV:  RRR, no rub, no gallop, no S3 Lung:  Bibasilar crackles. No wheezing. Good air movement. Abdomen: soft/NT, +BS, nondistended, no peritoneal signs Ext: No cyanosis, No rashes, No petechiae, No lymphangitis, 2+ LE edema Neuro: CNII-XII intact, strength 4/5 in bilateral upper ext and 4-/5 in right LE, 4/5 LLR, no dysmetria  Labs on Admission:  Basic Metabolic Panel:  Recent Labs Lab 02/05/13 0544 02/06/13 0304 02/09/13 1459  NA 142 137 139  K 4.1 4.3 4.7  CL 108 104 104  CO2 20 17* 20  GLUCOSE 116* 157* 65*  BUN 18 24* 25*  CREATININE 1.10 1.32* 1.10  CALCIUM 9.7 9.5 9.6   Liver Function Tests:  Recent Labs Lab 02/05/13 0544 02/06/13 0304  AST 47* 42*  ALT 76* 76*  ALKPHOS 59 58  BILITOT 0.3 0.4  PROT 6.5 6.5  ALBUMIN 3.5 3.6   No results found for this basename: LIPASE, AMYLASE,  in the last 168 hours No results found for this basename: AMMONIA,  in the last 168 hours CBC:  Recent Labs Lab 02/05/13 0544 02/09/13 1459  WBC 6.0 8.6  NEUTROABS  --  4.2  HGB 11.4* 11.1*  HCT 36.1 34.0*  MCV 90.0 89.5  PLT 228 286   Cardiac Enzymes:  Recent Labs Lab 02/05/13 0544  TROPONINI <0.30   BNP: No components found with this basename: POCBNP,  CBG:  Recent Labs Lab 02/06/13 1107 02/06/13 1608 02/06/13 2121 02/07/13 0544 02/09/13 1238  GLUCAP 196* 138* 157* 146* 72    Radiological Exams on Admission: Dg Chest 2 View  02/09/2013    CLINICAL DATA:  Shortness of breath and weakness  EXAM: CHEST  2 VIEW  COMPARISON:  02/05/2013  FINDINGS: Cardiac shadow remains enlarged. A defibrillator is again seen. Lungs are well aerated with a mild bibasilar atelectatic changes. The overall appearance is stable from the previous exam. No focal confluent infiltrate is seen. Marland Kitchen  IMPRESSION: Stable bibasilar atelectatic changes.   Electronically Signed   By: Alcide Clever M.D.   On: 02/09/2013 13:00   Ct Head Wo Contrast  02/09/2013   CLINICAL DATA:  Altered mental status  EXAM: CT HEAD WITHOUT CONTRAST  TECHNIQUE: Contiguous axial images were obtained from the base of the skull through the vertex without intravenous contrast. Study was obtained within 24 hr of patient's arrival at the emergency department.  COMPARISON:  July 11, 2012  FINDINGS: There is moderate diffuse atrophy. There is no demonstrable mass, hemorrhage, extra-axial fluid collection, or midline shift. There is extensive small vessel disease throughout the centra semiovale bilaterally. There is evidence of a prior small infarct in the globus pallidum on the right, a stable finding.  There is subtle decreased attenuation in the mid right cerebellum, and area concerning for recent infarct. No other findings suspicious for acute infarct.  The bony calvarium appears intact. The mastoid air cells are clear. There is rightward deviation of the nasal septum.  IMPRESSION: Area concerning for acute infarct in the mid right cerebellum in the region of the right dentate nucleus. No other evidence suspicious for potential acute infarct. No hemorrhage or mass effect. There is atrophy with extensive supratentorial small vessel disease. There is a prior small lacunar infarct in the right globus pallidum, stable.   Electronically Signed   By: Bretta Bang M.D.   On: 02/09/2013 13:29    EKG: Independently reviewed. Sinus rhythm, left bundle branch block    Time spent:70 minutes Code Status:   FULL  after discussion with daughter at bedside Family Communication:   Family at bedside   Kimmie Berggren, DO  [facility] Pager [phone]  If 7PM-7AM, please contact night-coverage [email or web address] Password [email or web address] 02/09/2013, 5:08 PM

## 2013-02-09 NOTE — Progress Notes (Signed)
ED called to report at 1810; pt arrived on unit at 1835 via stretcher with 2 people at side. Pt oriented to room, denies any pain, call light within reach, on 3L oxygen from the ED. Incoming RN reported off to.

## 2013-02-09 NOTE — ED Notes (Signed)
States that she came to the ED because of pain.  Patient has an AICD/Pacer implanted in her left chest.  States that it hurt her all night but denies having pain at this time. States that she does not know if the AICD shocked her last night.  AICD site is WDL.

## 2013-02-09 NOTE — Consult Note (Signed)
Referring Physician: D. Tat    Chief Complaint: Dizziness early this morning and speech change noticed by patient's daughter.  HPI: Paula [name] is an 73 y.o. female blisters systolic congestive heart failure, diabetes mellitus, hypertension and hyperlipidemia who was brought to the hospital with a complaint of chest pain and shortness of breath and an uncontrollable coughing. Her daughter also noted that speech was slurred. Patient indicated that she had vertigo when she woke up this morning. She was last known well at about 9 PM last night. CT scan of her head showed low-density area involving the left cerebellum indicative of probable acute ischemic stroke. Patient has a history of previous lacunar stroke involving right globus pallidus. NIH stroke score was 1 for slightly slurred speech. She's been taking aspirin daily 325 mg.  LSN: 9 PM on 02/08/2013 tPA Given: No: Minimal deficits in beyond time window for treatment consideration MRankin: 1  Past Medical History  Diagnosis Date  . Systolic CHF     Non ischemic, last Cath 09/21/08 - normal left main, LAD, LCx, ramus intermedius, RCA  . Hypertension   . Stroke   . Headache(784.0)   . Diabetes mellitus   . Dementia 05/27/2012  . Osteopenia     DEXA 2012 : T score hip -2.3, femur -2.1  . CHF (congestive heart failure)   . Shortness of breath   . Pacemaker     No family history on file.   Medications: I have reviewed the patient's current medications.  ROS: History obtained from child and the patient  General ROS: negative for - chills, fatigue, fever, night sweats, weight gain or weight loss Psychological ROS: negative for - behavioral disorder, hallucinations, memory difficulties, mood swings or suicidal ideation Ophthalmic ROS: negative for - blurry vision, double vision, eye pain or loss of vision ENT ROS: negative for - epistaxis, nasal discharge, oral lesions, sore throat, tinnitus or vertigo Allergy and Immunology  ROS: negative for - hives or itchy/watery eyes Hematological and Lymphatic ROS: negative for - bleeding problems, bruising or swollen lymph nodes Endocrine ROS: negative for - galactorrhea, hair pattern changes, polydipsia/polyuria or temperature intolerance Respiratory ROS: See history of present illness Cardiovascular ROS: negative for - chest pain, dyspnea on exertion, edema or irregular heartbeat Gastrointestinal ROS: negative for - abdominal pain, diarrhea, hematemesis, nausea/vomiting or stool incontinence Genito-Urinary ROS: negative for - dysuria, hematuria, incontinence or urinary frequency/urgency Musculoskeletal ROS: negative for - joint swelling or muscular weakness Neurological ROS: as noted in HPI Dermatological ROS: negative for rash and skin lesion changes  Physical Examination: Blood pressure 152/109, pulse 81, temperature 98.2 F (36.8 C), temperature source Oral, resp. rate 17, SpO2 100.00%.  Neurologic Examination: Mental Status: Alert, oriented, thought content appropriate.  Speech slightly slurred without evidence of aphasia. Able to follow commands without difficulty. Cranial Nerves: II-Visual fields were normal. III/IV/VI-Pupils were equal and reacted. Extraocular movements were full and conjugate, no nystagmus.    V/VII-no facial numbness and no facial weakness. VIII-normal. X-mild dysarthria; symmetrical palatal movement. Motor: 5/5 bilaterally with normal tone and bulk Sensory: Normal throughout. Deep Tendon Reflexes: Trace to 1+ and symmetric. Plantars: Flexor bilaterally Cerebellar: Normal finger-to-nose testing. Carotid auscultation: Normal  Dg Chest 2 View  02/09/2013   CLINICAL DATA:  Shortness of breath and weakness  EXAM: CHEST  2 VIEW  COMPARISON:  02/05/2013  FINDINGS: Cardiac shadow remains enlarged. A defibrillator is again seen. Lungs are well aerated with a mild bibasilar atelectatic changes. The overall appearance is stable from  the previous  exam. No focal confluent infiltrate is seen. Marland Kitchen.  IMPRESSION: Stable bibasilar atelectatic changes.   Electronically Signed   By: Alcide CleverMark  Lukens M.D.   On: 02/09/2013 13:00   Ct Head Wo Contrast  02/09/2013   CLINICAL DATA:  Altered mental status  EXAM: CT HEAD WITHOUT CONTRAST  TECHNIQUE: Contiguous axial images were obtained from the base of the skull through the vertex without intravenous contrast. Study was obtained within 24 hr of patient's arrival at the emergency department.  COMPARISON:  July 11, 2012  FINDINGS: There is moderate diffuse atrophy. There is no demonstrable mass, hemorrhage, extra-axial fluid collection, or midline shift. There is extensive small vessel disease throughout the centra semiovale bilaterally. There is evidence of a prior small infarct in the globus pallidum on the right, a stable finding.  There is subtle decreased attenuation in the mid right cerebellum, and area concerning for recent infarct. No other findings suspicious for acute infarct.  The bony calvarium appears intact. The mastoid air cells are clear. There is rightward deviation of the nasal septum.  IMPRESSION: Area concerning for acute infarct in the mid right cerebellum in the region of the right dentate nucleus. No other evidence suspicious for potential acute infarct. No hemorrhage or mass effect. There is atrophy with extensive supratentorial small vessel disease. There is a prior small lacunar infarct in the right globus pallidum, stable.   Electronically Signed   By: Bretta BangWilliam  Woodruff M.D.   On: 02/09/2013 13:29    Assessment: 73 y.o. 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  Stroke Risk Factors - diabetes mellitus, family history, hyperlipidemia and hypertension  Plan: 1. HgbA1c, fasting lipid panel 2. CTA of head and neck with contrast 3. PT consult, OT consult, Speech consult 4.  Echocardiogram 5. Prophylactic therapy-Antiplatelet med: Aspirin 6. Risk factor modification 7. Telemetry monitoring   C.R. Roseanne RenoStewart, MD [facility] [phone]  02/09/2013, 5:20 PM

## 2013-02-09 NOTE — ED Provider Notes (Signed)
TIME SEEN: 12:34 PM  CHIEF COMPLAINT: Altered mental status, chest pain    HPI: Pt is a 73 y.o. F with history of nonischemic cardiomyopathy, hypertension, diabetes, prior stroke, pacemaker who presents the emergency department with concerns for altered mental status per EMS. Patient has no family at bedside and reports that she is here because she is having left-sided aching chest pain, shortness of breath and diaphoresis that started last night. She states she has had some dry cough and believes she has had a fever as well. She denies any vomiting or diarrhea. Denies any numbness, tingling or focal weakness. Denies any headache. Patient was recently admitted to the hospital for acute on chronic CHF.  ROS: See HPI Constitutional:  fever  Eyes: no drainage  ENT: no runny nose   Cardiovascular:   chest pain  Resp: SOB  GI: no vomiting GU: no dysuria Integumentary: no rash  Allergy: no hives  Musculoskeletal: no leg swelling  Neurological: no slurred speech ROS otherwise negative  PAST MEDICAL HISTORY/PAST SURGICAL HISTORY:  Past Medical History  Diagnosis Date  . Systolic CHF     Non ischemic, last Cath 09/21/08 - normal left main, LAD, LCx, ramus intermedius, RCA  . Hypertension   . Stroke   . Headache(784.0)   . Diabetes mellitus   . Dementia 05/27/2012  . Osteopenia     DEXA 2012 : T score hip -2.3, femur -2.1  . CHF (congestive heart failure)   . Shortness of breath   . Pacemaker     MEDICATIONS:  Prior to Admission medications   Medication Sig Start Date End Date Taking? Authorizing Provider  alendronate (FOSAMAX) 70 MG tablet Take 70 mg by mouth once a week. Take with a full glass of water on an empty stomach. On wednesdays    Historical Provider, MD  aspirin 325 MG tablet Take 325 mg by mouth daily.    Historical Provider, MD  atorvastatin (LIPITOR) 40 MG tablet Take 40 mg by mouth daily.    Historical Provider, MD  carvedilol (COREG) 3.125 MG tablet Take 1 tablet  (3.125 mg total) by mouth 2 (two) times daily with a meal. 02/06/13   Evelena Peat, DO  fenofibrate 160 MG tablet Take 160 mg by mouth daily.    Historical Provider, MD  furosemide (LASIX) 20 MG tablet Take 20 mg by mouth daily.    Historical Provider, MD  insulin aspart (NOVOLOG) 100 UNIT/ML injection Inject 6 Units into the skin 3 (three) times daily before meals. 12/26/12   Lonia Blood, MD  Insulin Detemir (LEVEMIR FLEXPEN) 100 UNIT/ML Pen Inject 15 Units into the skin daily.    Historical Provider, MD  isosorbide mononitrate (IMDUR) 60 MG 24 hr tablet Take 60 mg by mouth daily.    Historical Provider, MD  lisinopril (PRINIVIL,ZESTRIL) 40 MG tablet Take 0.5 tablets (20 mg total) by mouth daily. 02/08/13   Genelle Gather, MD  loratadine-pseudoephedrine (CLARITIN-D 12-HOUR) 5-120 MG per tablet Take 1 tablet by mouth 2 (two) times daily as needed for allergies.    Historical Provider, MD  Omega-3 Fatty Acids (FISH OIL) 1200 MG CAPS Take 1 capsule by mouth daily.    Historical Provider, MD  pantoprazole (PROTONIX) 40 MG tablet Take 40 mg by mouth daily.    Historical Provider, MD  potassium chloride SA (K-DUR,KLOR-CON) 20 MEQ tablet Take 20 mEq by mouth daily.    Historical Provider, MD  traMADol (ULTRAM) 50 MG tablet Take 1 tablet (50 mg total)  by mouth every 6 (six) hours as needed. For pain 07/10/12   Kermit Baloiffany L Reed, DO    ALLERGIES:  No Known Allergies  SOCIAL HISTORY:  History  Substance Use Topics  . Smoking status: Current Every Day Smoker    Types: Cigarettes  . Smokeless tobacco: Never Used  . Alcohol Use: No    FAMILY HISTORY: No family history on file.  EXAM: BP 150/100  Pulse 80  Temp(Src) 98 F (36.7 C) (Oral)  Resp 19  SpO2 98% CONSTITUTIONAL: Alert and oriented  x3 nd responds appropriately to questions. Well-appearing; well-nourished HEAD: Normocephalic EYES: Conjunctivae clear, PERRL ENT: normal nose; no rhinorrhea; moist mucous membranes; pharynx without  lesions noted NECK: Supple, no meningismus, no LAD  CARD: RRR; S1 and S2 appreciated; no murmurs, no clicks, no rubs, no gallops RESP: Normal chest excursion without splinting or tachypnea; breath sounds equal bilaterally, no wheezing or rhonchi the patient does have bilateral rales that are worse on the left ABD/GI: Normal bowel sounds; non-distended; soft, non-tender, no rebound, no guarding BACK:  The back appears normal and is non-tender to palpation, there is no CVA tenderness EXT: Normal ROM in all joints; non-tender to palpation; no edema; normal capillary refill; no cyanosis    SKIN: Normal color for age and race; warm NEURO: Moves all extremities equally PSYCH: The patient's mood and manner are appropriate. Grooming and personal hygiene are appropriate.  MEDICAL DECISION MAKING:  Patient here with complaints of chest pain. She was recently admitted to the hospital for acute on chronic CHF. Per EMS however patient was sent to the emergency department by family for altered mental status. On exam, patient is neurologically intact and oriented x3.  Will obtain labs, troponin, chest x-ray, urine, CT head.   ED PROGRESS: Head CT shows acute right cerebellar infarct. Patient is neurologically intact on exam. Will attempt to walk the patient. Her other labs unremarkable. Troponin is negative. BNP is elevated at greater than 5000 her chest x-ray shows no edema she does not appear volume overload on exam. Discussed with hospitalist for admission for stroke workup, ACS rule out. Discussed with Dr. Roseanne RenoStewart, neuro hospitalist who will see the patient in consult.   EKG Interpretation    Date/Time:  Monday February 09 2013 11:57:25 EST Ventricular Rate:  84 PR Interval:  190 QRS Duration: 144 QT Interval:  442 QTC Calculation: 522 R Axis:   -50 Text Interpretation:  Sinus rhythm Ventricular bigeminy Probable left atrial enlargement Left bundle branch block No significant change since last tracing  Confirmed by WARD  DO, KRISTEN ([id number]) on 02/09/2013 12:11:29 PM             Layla MawKristen N Ward, DO 02/09/13 1623

## 2013-02-09 NOTE — ED Notes (Signed)
Patient cbg was 72.

## 2013-02-09 NOTE — ED Notes (Signed)
Patient  D/C from the hospital last week.  Family states that she has been lethargic with and AMS since D/C/

## 2013-02-09 NOTE — ED Notes (Signed)
Phlebotomy sts to this RN unable to get blood from pt. RN notified Dr. Elesa Massed. Dr. Elesa Massed completed Fem stick.

## 2013-02-10 ENCOUNTER — Inpatient Hospital Stay (HOSPITAL_COMMUNITY): Payer: Medicare Other

## 2013-02-10 DIAGNOSIS — F0390 Unspecified dementia without behavioral disturbance: Secondary | ICD-10-CM

## 2013-02-10 DIAGNOSIS — R079 Chest pain, unspecified: Secondary | ICD-10-CM

## 2013-02-10 DIAGNOSIS — I059 Rheumatic mitral valve disease, unspecified: Secondary | ICD-10-CM

## 2013-02-10 DIAGNOSIS — I6350 Cerebral infarction due to unspecified occlusion or stenosis of unspecified cerebral artery: Secondary | ICD-10-CM

## 2013-02-10 DIAGNOSIS — I10 Essential (primary) hypertension: Secondary | ICD-10-CM

## 2013-02-10 HISTORY — PX: TRANSTHORACIC ECHOCARDIOGRAM: SHX275

## 2013-02-10 LAB — LIPID PANEL
CHOLESTEROL: 111 mg/dL (ref 0–200)
HDL: 31 mg/dL — AB (ref >39)
LDL Cholesterol: 59 mg/dL (ref 0–99)
Total CHOL/HDL Ratio: 3.6 RATIO
Triglycerides: 105 mg/dL (ref <150)
VLDL: 21 mg/dL (ref 0–40)

## 2013-02-10 LAB — COMPREHENSIVE METABOLIC PANEL
ALK PHOS: 62 U/L (ref 39–117)
ALT: 83 U/L — ABNORMAL HIGH (ref 0–35)
AST: 36 U/L (ref 0–37)
Albumin: 3.2 g/dL — ABNORMAL LOW (ref 3.5–5.2)
BUN: 32 mg/dL — AB (ref 6–23)
CALCIUM: 9.2 mg/dL (ref 8.4–10.5)
CO2: 19 mEq/L (ref 19–32)
Chloride: 100 mEq/L (ref 96–112)
Creatinine, Ser: 1.5 mg/dL — ABNORMAL HIGH (ref 0.50–1.10)
GFR calc non Af Amer: 33 mL/min — ABNORMAL LOW (ref >90)
GFR, EST AFRICAN AMERICAN: 39 mL/min — AB (ref >90)
Glucose, Bld: 219 mg/dL — ABNORMAL HIGH (ref 70–99)
Potassium: 4.6 mEq/L (ref 3.7–5.3)
Sodium: 133 mEq/L — ABNORMAL LOW (ref 137–147)
TOTAL PROTEIN: 6 g/dL (ref 6.0–8.3)
Total Bilirubin: 0.4 mg/dL (ref 0.3–1.2)

## 2013-02-10 LAB — GLUCOSE, CAPILLARY
GLUCOSE-CAPILLARY: 73 mg/dL (ref 70–99)
GLUCOSE-CAPILLARY: 124 mg/dL — AB (ref 70–99)
GLUCOSE-CAPILLARY: 172 mg/dL — AB (ref 70–99)
Glucose-Capillary: 159 mg/dL — ABNORMAL HIGH (ref 70–99)

## 2013-02-10 LAB — TROPONIN I
Troponin I: <0.3 ng/mL (ref <0.30)
Troponin I: <0.3 ng/mL (ref <0.30)

## 2013-02-10 LAB — MAGNESIUM: MAGNESIUM: 2.1 mg/dL (ref 1.5–2.5)

## 2013-02-10 LAB — HEMOGLOBIN A1C
Hgb A1c MFr Bld: 8.5 % — ABNORMAL HIGH (ref <5.7)
MEAN PLASMA GLUCOSE: 197 mg/dL — AB (ref <117)

## 2013-02-10 MED ORDER — IOHEXOL 350 MG/ML SOLN
50.0000 mL | Freq: Once | INTRAVENOUS | Status: AC | PRN
  Administered 2013-02-10: 50 mL via INTRAVENOUS

## 2013-02-10 NOTE — Progress Notes (Signed)
Noted HgbA1C of 12.7% Pt takes levemir 15 units at ome and novolog 6 units tidwc. Will talk with pt to assess problems with glucose control. Thank you, Lenor Coffin, RN, CNS, Diabetes Coordinator [phone])

## 2013-02-10 NOTE — Progress Notes (Signed)
Stroke Team Progress Note  HISTORY Paula [name] is an 73 y.o. Female who present with blisters systolic congestive heart failure, diabetes mellitus, hypertension and hyperlipidemia who was brought to the hospital with a complaint of chest pain and shortness of breath and an uncontrollable coughing. Her daughter also noted that speech was slurred. Patient indicated that she had vertigo when she woke up this morning. She was last known well at about 9 PM last night. CT scan of her head showed low-density area involving the left cerebellum indicative of probable acute ischemic stroke. Patient has a history of previous lacunar stroke involving right globus pallidus. NIH stroke score was 1 for slightly slurred speech. She's been taking aspirin daily 325 mg.  Patient was not administerd TPA secondary to Minimal deficits in beyond time window for treatment consideration. She was admitted for further evaluation and treatment.  SUBJECTIVE Her RN is at the bedside.  Overall she feels her condition is unchanged. She says she is still tired and short of breath sometimes.  OBJECTIVE Most recent Vital Signs: Filed Vitals:   02/10/13 0212 02/10/13 0417 02/10/13 0546 02/10/13 1030  BP: 127/80 143/84 124/80 125/71  Pulse: 72 92 83 74  Temp: 97.8 F (36.6 C) 98.7 F (37.1 C) 97.6 F (36.4 C) 98 F (36.7 C)  TempSrc: Axillary Oral Oral Oral  Resp: 24 20 20 20   Height:      Weight:   82.781 kg (182 lb 8 oz)   SpO2: 100% 99% 99% 97%   CBG (last 3)   Recent Labs  02/09/13 2111 02/10/13 0714 02/10/13 1135  GLUCAP 123* 73 124*    IV Fluid Intake:     MEDICATIONS  . atorvastatin  40 mg Oral Daily  . carvedilol  3.125 mg Oral BID WC  . clopidogrel  75 mg Oral Q breakfast  . enoxaparin (LOVENOX) injection  40 mg Subcutaneous Q24H  . fenofibrate  160 mg Oral Daily  . furosemide  40 mg Intravenous Daily  . insulin aspart  0-15 Units Subcutaneous TID WC  . isosorbide mononitrate  60 mg Oral Daily   . lisinopril  20 mg Oral Daily  . omega-3 acid ethyl esters  1 g Oral Daily  . potassium chloride SA  20 mEq Oral Daily   PRN:  acetaminophen, acetaminophen, nitroGLYCERIN, senna-docusate  Diet:  Carb Control thin liquids Activity:   DVT Prophylaxis:  Lovenox 40 mg sq daily  CLINICALLY SIGNIFICANT STUDIES Basic Metabolic Panel:   Recent Labs Lab 02/06/13 0304 02/09/13 1459  NA 137 139  K 4.3 4.7  CL 104 104  CO2 17* 20  GLUCOSE 157* 65*  BUN 24* 25*  CREATININE 1.32* 1.10  CALCIUM 9.5 9.6   Liver Function Tests:   Recent Labs Lab 02/05/13 0544 02/06/13 0304  AST 47* 42*  ALT 76* 76*  ALKPHOS 59 58  BILITOT 0.3 0.4  PROT 6.5 6.5  ALBUMIN 3.5 3.6   CBC:   Recent Labs Lab 02/05/13 0544 02/09/13 1459  WBC 6.0 8.6  NEUTROABS  --  4.2  HGB 11.4* 11.1*  HCT 36.1 34.0*  MCV 90.0 89.5  PLT 228 286   Coagulation:   Recent Labs Lab 02/09/13 1439  LABPROT 13.9  INR 1.09   Cardiac Enzymes:   Recent Labs Lab 02/05/13 0544 02/10/13 0005 02/10/13 0721  TROPONINI <0.30 <0.30 <0.30   Urinalysis:   Recent Labs Lab 02/09/13 1628 02/09/13 2123  COLORURINE YELLOW YELLOW  LABSPEC 1.030 1.018  PHURINE  5.0 5.0  GLUCOSEU NEGATIVE NEGATIVE  HGBUR NEGATIVE NEGATIVE  BILIRUBINUR NEGATIVE NEGATIVE  KETONESUR NEGATIVE NEGATIVE  PROTEINUR >300* 100*  UROBILINOGEN 1.0 1.0  NITRITE NEGATIVE NEGATIVE  LEUKOCYTESUR NEGATIVE NEGATIVE   Lipid Panel    Component Value Date/Time   CHOL 111 02/10/2013 0721   TRIG 105 02/10/2013 0721   HDL 31* 02/10/2013 0721   CHOLHDL 3.6 02/10/2013 0721   VLDL 21 02/10/2013 0721   LDLCALC 59 02/10/2013 0721   HgbA1C  Lab Results  Component Value Date   HGBA1C 12.7* 12/24/2012    Urine Drug Screen:     Component Value Date/Time   LABOPIA NONE DETECTED 02/09/2013 2123   COCAINSCRNUR NONE DETECTED 02/09/2013 2123   LABBENZ NONE DETECTED 02/09/2013 2123   AMPHETMU NONE DETECTED 02/09/2013 2123   THCU NONE DETECTED 02/09/2013 2123    LABBARB NONE DETECTED 02/09/2013 2123    Alcohol Level: No results found for this basename: ETH,  in the last 168 hours   CT of the brain  02/09/2013  Area concerning for acute infarct in the mid right cerebellum in the region of the right dentate nucleus. No other evidence suspicious for potential acute infarct. No hemorrhage or mass effect. There is atrophy with extensive supratentorial small vessel disease. There is a prior small lacunar infarct in the right globus pallidum, stable.   MRI/A of the brain  pacemaker  CTA of the head and neck     2D Echocardiogram    Carotid Doppler  No evidence of hemodynamically significant internal carotid artery stenosis. Vertebral artery flow is antegrade.   CXR  02/09/2013    Stable bibasilar atelectatic changes.  EKG  normal sinus rhythm. For complete results please see formal report.   Therapy Recommendations   Physical Exam   Short of breath at rest with minimal exertion. On oxygen nasal canula. t. Afebrile. Head is nontraumatic. Neck is supple without bruit. Hearing is normal. Cardiac exam no murmur or gallop. Lungs are clear to auscultation. Distal pulses are well felt. Neurological Exam ;  Awake  Alert oriented x 3. Normal speech and language.eye movements full without nystagmus.fundi were not visualized. Vision acuity and fields appear normal. Hearing is normal. Palatal movements are normal. Face symmetric. Tongue midline. Normal strength, tone, reflexes and coordination. Normal sensation. Gait deferred. ASSESSMENT Ms. Paula [name] is a 73 y.o. female presenting with dizziness and speech change. Imaging pending. Suspect a stroke vs medical illness (CHF w/ exacerbation). On aspirin 325 mg orally every day prior to admission. Now on clopidogrel 75 mg orally every day for secondary stroke prevention. Patient with no resultant focal weakness. Work up underway.  hypertension  Diabetes, HgbA1c 12.7 in Dec, goal < 7.0 Hyperlipidemia, LDL 59, on  lipitor 40 daily PTA, now on lipitor 40 mg daily, goal LDL < 100 (< 70 for diabetics) Acute on chronic systolic heart failure - becomes fatigued with only minimal movement Atypical chest pain Baseline dementia Chronic kidney disease, stage 3, Cr 1.1 pacemaker  Hospital day # 1  TREATMENT/PLAN  Continue* clopidogrel 75 mg orally every day for secondary stroke prevention.  CT angio of head and neck to confirm stroke  Annie MainSHARON BIBY, MSN, RN, ANVP-BC, ANP-BC, GNP-BC [facility] Pager: [phone] 02/10/2013 12:01 PM  I have personally obtained a history, examined the patient, evaluated imaging results, and formulated the assessment and plan of care. I agree with the above. Delia HeadyPramod Sethi, MD

## 2013-02-10 NOTE — Progress Notes (Signed)
   CARE MANAGEMENT NOTE 02/10/2013  Patient:  Paula [name], Paula [name]   Account Number:  [id number]  Date Initiated:  02/10/2013  Documentation initiated by:  Jiles Crocker  Subjective/Objective Assessment:   ADMITTED WITH CVA     Action/Plan:   CM FOLLOWING FOR DCP   Anticipated DC Date:  02/14/2013   Anticipated DC Plan:  HOME W HOME HEALTH SERVICES    DC Planning Services  CM consult          Status of service:  In process, will continue to follow Medicare Important Message given?  NA - LOS <3 / Initial given by admissions (If response is "NO", the following Medicare IM given date fields will be blank)  Per UR Regulation:  Reviewed for med. necessity/level of care/duration of stay  Comments:  2/10/2015Abelino Derrick RN,BSN,MHA [phone]

## 2013-02-10 NOTE — Progress Notes (Signed)
[facility] PROGRESS NOTE  Paula [name] ZOX:[id number] DOB: Oct 06, 1940 DOA: 02/09/2013 PCP: [name], MD  Assessment/Plan:  Acute encephalopathy  -Multifactorial including stroke, hypoglycemia, tramadol, and decompensated heart failure  -Discontinue tramadol  -Urinalysis with reflex to urine culture  -UDS; negative   Abnormal CT brain  -Concerned about acute stroke  -Neurology has been consulted  -Cannot obtain MRI secondary to patient's AICD  - echocardiogram results pending  -Carotid duplex; see below for result -Lipid panel; low HDL however LDL well below NCEP guideline  -Start Plavix 75 mg daily  acute on chronic combined systolic and diastolic CHF  -ProBNP =5031  -The patient is clinically volume overloaded  -She does not weigh herself daily at home (noncompliance)  -Start furosemide 40 mg IV daily  -2/10 weights = 82.7 kg(How weighed?) -Fluid restrict    Atypical chest pain  -Cycle troponins  -EKG was unchanged left bundle branch block  -Consulted cardiology secondary to patient reporting implantable cardioverter/defibrillator fired several times prior to admission will see patient on 2/11  Diabetes mellitus type 2, poorly controlled  - 02/10/2013 hemoglobin A1c= 8.5  -DC Levemir at this time as the patient is hypoglycemic  -She is unable to clarify for me when she last had insulin and what type  -NovoLog sliding scale  -serum glucose was 65 on BMP   Hypertension  - continue lisinopril and carvedilol  -Continue Imdur -W/I AHA guidelines     Code Status: Full Family Communication:  Disposition Plan:    Consultants: Dr. Noel Christmas (neurology)   Procedures: Echocardiogram 02/10/2013 results pending  Carotid artery duplex 02/10/2013 Carotid duplex completed.  Preliminary report: Bilateral: 1-39% ICA stenosis. Vertebral artery flow is antegrade.     CT angiogram head with/without contrast 02/10/2013 1. No evidence of cervical  carotid stenosis.  2. 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 Dominant  left vertebral artery without stenosis.  3. Mild intracranial arterial irregularity suggestive of  atherosclerosis with mild to moderate narrowing of the left P2  segment.  4. Unchanged appearance of the brain.  CT head without contrast 02/09/2013 Area concerning for acute infarct in the mid right cerebellum in the  region of the right dentate nucleus. No other evidence suspicious  for potential acute infarct. No hemorrhage or mass effect. There is  atrophy with extensive supratentorial small vessel disease. There is  a prior small lacunar infarct in the right globus pallidum, stable.     Antibiotics:     HPI/Subjective: 73 year old female BF PMHx combined systolic and diastolic heart failure, hypertension, stroke, nonischemic cardiomyopathy (with pacer) presents with a two-day history of chest pain and shortness of breath. The patient is a poor historian. She is unable to clarify in any detail the quality, duration, or circumstances surrounding her chest pain. She was able to tell me that her chest pain has been occurring at rest. The patient's daughter is at the bedside, but unfortunately she is unable to provide any significant history to assist. The patient is unable to provide any clearity regarding the circumstances surrounding her shortness of breath other than the fact that her breathing has improved since arrival to the emergency department. Her daughter was able to tell me that the patient is a little more confused than usual, but she has not seen the patient for over one month. The patient has not able to tell me if she has been taking her medications, but the patient's daughter states that her home health aide helps g the patient's  medications to take on a daily basis. The patient was recently discharged from the hospital on 02/07/2013 after a short stay for acute on chronic CHF. Currently, the  patient is chest pain-free and denies any shortness of breath, nausea, vomiting, dizziness, abdominal pain, dysuria, hematuria.  In the emergency department, workup including CT of the brain revealed an area of concern for acute infarction in the right cerebellum. Chest x-ray showed interstitial prominence with bibasilar atelectasis. The patient was afebrile and hemodynamically stable. She was given 4 baby aspirin in the ED. BMP showed serum creatinine 1.10. Serum glucose was 65. ProBNP was 5031. A stat troponin was 0.07. 2/10 patient states she recalls on 12/14 2012 implantable cardioverter defibrillator implanted by Dr. Rachelle Hora. Patient states cardio defibrillator firing multiple times prior to her admission. States still having some SOB, negative CP, negative additional firing of ACID. States does not weigh herself daily at home but believes her base weight is 180 pounds  Objective: Filed Vitals:   02/10/13 0417 02/10/13 0546 02/10/13 1030 02/10/13 1434  BP: 143/84 124/80 125/71 126/80  Pulse: 92 83 74 72  Temp: 98.7 F (37.1 C) 97.6 F (36.4 C) 98 F (36.7 C) 98 F (36.7 C)  TempSrc: Oral Oral Oral Oral  Resp: 20 20 20 20   Height:      Weight:  82.781 kg (182 lb 8 oz)    SpO2: 99% 99% 97% 100%    Intake/Output Summary (Last 24 hours) at 02/10/13 1641 Last data filed at 02/10/13 1249  Gross per 24 hour  Intake    240 ml  Output      0 ml  Net    240 ml   Filed Weights   02/09/13 1843 02/10/13 0546  Weight: 82.9 kg (182 lb 12.2 oz) 82.781 kg (182 lb 8 oz)    Exam:   General:  A./O. x4, NAD  Cardiovascular: Regular rhythm and rate, negative murmurs rubs gallops, DP/PT pulse one plus bilateral  Respiratory: Clear to off the patient bilaterally except for mild crackles left lower lung  Abdomen: Soft, nontender, nondistended, plus bowel sounds  Musculoskeletal: Positive 2-3+ pedal edema bilateral to knee      Data Reviewed: Basic Metabolic Panel:  Recent Labs Lab  02/05/13 0544 02/06/13 0304 02/09/13 1459  NA 142 137 139  K 4.1 4.3 4.7  CL 108 104 104  CO2 20 17* 20  GLUCOSE 116* 157* 65*  BUN 18 24* 25*  CREATININE 1.10 1.32* 1.10  CALCIUM 9.7 9.5 9.6   Liver Function Tests:  Recent Labs Lab 02/05/13 0544 02/06/13 0304  AST 47* 42*  ALT 76* 76*  ALKPHOS 59 58  BILITOT 0.3 0.4  PROT 6.5 6.5  ALBUMIN 3.5 3.6   No results found for this basename: LIPASE, AMYLASE,  in the last 168 hours No results found for this basename: AMMONIA,  in the last 168 hours CBC:  Recent Labs Lab 02/05/13 0544 02/09/13 1459  WBC 6.0 8.6  NEUTROABS  --  4.2  HGB 11.4* 11.1*  HCT 36.1 34.0*  MCV 90.0 89.5  PLT 228 286   Cardiac Enzymes:  Recent Labs Lab 02/05/13 0544 02/10/13 0005 02/10/13 0721  TROPONINI <0.30 <0.30 <0.30   BNP (last 3 results)  Recent Labs  07/11/12 1526 02/05/13 0544 02/09/13 1439  PROBNP 299.3* 4211.0* 5031.0*   CBG:  Recent Labs Lab 02/07/13 0544 02/09/13 1238 02/09/13 2111 02/10/13 0714 02/10/13 1135  GLUCAP 146* 72 123* 73 124*    No  results found for this or any previous visit (from the past 240 hour(s)).   Studies: Ct Angio Head W/cm &/or Wo Cm  02/10/2013   CLINICAL DATA:  Vertigo and slurred speech. Concern for acute stroke.  EXAM: CT ANGIOGRAPHY HEAD AND NECK  TECHNIQUE: Multidetector CT imaging of the head and neck was performed using the standard protocol during bolus administration of intravenous contrast. Multiplanar CT image reconstructions and MIPs were obtained to evaluate the vascular anatomy. Carotid stenosis measurements (when applicable) are obtained utilizing NASCET criteria, using the distal internal carotid diameter as the denominator.  CONTRAST:  50mL OMNIPAQUE IOHEXOL 350 MG/ML SOLN  COMPARISON:  Head CT 02/09/2013.  Head MRI and MRA 11/17/2002.  FINDINGS: CTA HEAD FINDINGS  The distal right vertebral artery is hypoplastic and ends in PICA. Distal left vertebral artery is patent and  contains a fenestration in its proximal V4 segment. Left PICA origin is patent. Basilar artery is mildly small in caliber diffusely but patent and without evidence of significant focal stenosis. SCA origins are patent bilaterally. There are hypoplastic P1 segments bilaterally with prominent bilateral posterior communicating arteries. There is very mild irregularity of the right P2 segment. There is mild irregularity and mild to moderate narrowing of the proximal to mid left P2 segment. The internal carotid arteries are patent from skullbase to carotid termini. ACA and MCA origins are patent. Right A1 segment appears mildly hypoplastic. There is very mild irregularity of ACA and MCA branches bilaterally without evidence of significant proximal stenosis. No intracranial aneurysm is identified.  Hypoattenuation in the inferior right cerebellum is unchanged from recent noncontrast head CT. Remote right basal ganglia lacunar infarct is unchanged. Cerebral atrophy is unchanged. Confluent periventricular white matter hypodensities with greater involvement of the left cerebellar hemisphere compared to the right are unchanged and compatible with moderate chronic small vessel ischemic disease. There is no evidence of mass, midline shift, intracranial hemorrhage, or extra-axial fluid collection. No abnormal parenchymal or meningeal enhancement is identified after the IV administration of contrast material. Orbits are unremarkable. Visualized mastoid air cells and paranasal sinuses are clear.  Review of the MIP images confirms the above findings.  CTA NECK FINDINGS  Incidental note is made of a common origin of the innominate and left common carotid arteries. Mild aortic arch calcification is noted including involving the proximal left subclavian artery. Evaluation of the proximal arch vessels is mildly limited by streak artifact from patient's pacemaker generator and leads. The common carotid arteries are patent without  stenosis. There is minimal atherosclerotic calcification at the left carotid bifurcation without stenosis. Cervical internal carotid arteries are patent and unremarkable.  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 The proximal aspect of the right vertebral artery is difficult to fully evaluate due to its small caliber and artifact in this region, however the remainder of the right vertebral artery appears patent. The left vertebral artery is patent and dominant without significant stenosis identified.  The soft tissues of the neck are grossly unremarkable. Mild to moderate disc space narrowing and endplate spurring are present in the mid and lower cervical spine.  Review of the MIP images confirms the above findings.  IMPRESSION: 1. No evidence of cervical carotid stenosis. 2. 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 Dominant left vertebral artery without stenosis. 3. Mild intracranial arterial irregularity suggestive of atherosclerosis with mild to moderate narrowing of the left P2 segment. 4. Unchanged appearance of the brain.   Electronically Signed  By: Sebastian Ache   On: 02/10/2013 16:28   Dg Chest 2 View  02/09/2013   CLINICAL DATA:  Shortness of breath and weakness  EXAM: CHEST  2 VIEW  COMPARISON:  02/05/2013  FINDINGS: Cardiac shadow remains enlarged. A defibrillator is again seen. Lungs are well aerated with a mild bibasilar atelectatic changes. The overall appearance is stable from the previous exam. No focal confluent infiltrate is seen. Marland Kitchen  IMPRESSION: Stable bibasilar atelectatic changes.   Electronically Signed   By: Alcide Clever M.D.   On: 02/09/2013 13:00   Ct Head Wo Contrast  02/09/2013   CLINICAL DATA:  Altered mental status  EXAM: CT HEAD WITHOUT CONTRAST  TECHNIQUE: Contiguous axial images were obtained from the base of the skull through the vertex without intravenous contrast. Study was obtained within 24 hr of  patient's arrival at the emergency department.  COMPARISON:  July 11, 2012  FINDINGS: There is moderate diffuse atrophy. There is no demonstrable mass, hemorrhage, extra-axial fluid collection, or midline shift. There is extensive small vessel disease throughout the centra semiovale bilaterally. There is evidence of a prior small infarct in the globus pallidum on the right, a stable finding.  There is subtle decreased attenuation in the mid right cerebellum, and area concerning for recent infarct. No other findings suspicious for acute infarct.  The bony calvarium appears intact. The mastoid air cells are clear. There is rightward deviation of the nasal septum.  IMPRESSION: Area concerning for acute infarct in the mid right cerebellum in the region of the right dentate nucleus. No other evidence suspicious for potential acute infarct. No hemorrhage or mass effect. There is atrophy with extensive supratentorial small vessel disease. There is a prior small lacunar infarct in the right globus pallidum, stable.   Electronically Signed   By: Bretta Bang M.D.   On: 02/09/2013 13:29   Ct Angio Neck W/cm &/or Wo/cm  02/10/2013   CLINICAL DATA:  Vertigo and slurred speech. Concern for acute stroke.  EXAM: CT ANGIOGRAPHY HEAD AND NECK  TECHNIQUE: Multidetector CT imaging of the head and neck was performed using the standard protocol during bolus administration of intravenous contrast. Multiplanar CT image reconstructions and MIPs were obtained to evaluate the vascular anatomy. Carotid stenosis measurements (when applicable) are obtained utilizing NASCET criteria, using the distal internal carotid diameter as the denominator.  CONTRAST:  22mL OMNIPAQUE IOHEXOL 350 MG/ML SOLN  COMPARISON:  Head CT 02/09/2013.  Head MRI and MRA 11/17/2002.  FINDINGS: CTA HEAD FINDINGS  The distal right vertebral artery is hypoplastic and ends in PICA. Distal left vertebral artery is patent and contains a fenestration in its proximal V4  segment. Left PICA origin is patent. Basilar artery is mildly small in caliber diffusely but patent and without evidence of significant focal stenosis. SCA origins are patent bilaterally. There are hypoplastic P1 segments bilaterally with prominent bilateral posterior communicating arteries. There is very mild irregularity of the right P2 segment. There is mild irregularity and mild to moderate narrowing of the proximal to mid left P2 segment. The internal carotid arteries are patent from skullbase to carotid termini. ACA and MCA origins are patent. Right A1 segment appears mildly hypoplastic. There is very mild irregularity of ACA and MCA branches bilaterally without evidence of significant proximal stenosis. No intracranial aneurysm is identified.  Hypoattenuation in the inferior right cerebellum is unchanged from recent noncontrast head CT. Remote right basal ganglia lacunar infarct is unchanged. Cerebral atrophy is unchanged. Confluent periventricular white matter hypodensities  with greater involvement of the left cerebellar hemisphere compared to the right are unchanged and compatible with moderate chronic small vessel ischemic disease. There is no evidence of mass, midline shift, intracranial hemorrhage, or extra-axial fluid collection. No abnormal parenchymal or meningeal enhancement is identified after the IV administration of contrast material. Orbits are unremarkable. Visualized mastoid air cells and paranasal sinuses are clear.  Review of the MIP images confirms the above findings.  CTA NECK FINDINGS  Incidental note is made of a common origin of the innominate and left common carotid arteries. Mild aortic arch calcification is noted including involving the proximal left subclavian artery. Evaluation of the proximal arch vessels is mildly limited by streak artifact from patient's pacemaker generator and leads. The common carotid arteries are patent without stenosis. There is minimal atherosclerotic  calcification at the left carotid bifurcation without stenosis. Cervical internal carotid arteries are patent and unremarkable.  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 The proximal aspect of the right vertebral artery is difficult to fully evaluate due to its small caliber and artifact in this region, however the remainder of the right vertebral artery appears patent. The left vertebral artery is patent and dominant without significant stenosis identified.  The soft tissues of the neck are grossly unremarkable. Mild to moderate disc space narrowing and endplate spurring are present in the mid and lower cervical spine.  Review of the MIP images confirms the above findings.  IMPRESSION: 1. No evidence of cervical carotid stenosis. 2. 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 Dominant left vertebral artery without stenosis. 3. Mild intracranial arterial irregularity suggestive of atherosclerosis with mild to moderate narrowing of the left P2 segment. 4. Unchanged appearance of the brain.   Electronically Signed   By: Sebastian Ache   On: 02/10/2013 16:28    Scheduled Meds: . atorvastatin  40 mg Oral Daily  . carvedilol  3.125 mg Oral BID WC  . clopidogrel  75 mg Oral Q breakfast  . enoxaparin (LOVENOX) injection  40 mg Subcutaneous Q24H  . fenofibrate  160 mg Oral Daily  . furosemide  40 mg Intravenous Daily  . insulin aspart  0-15 Units Subcutaneous TID WC  . isosorbide mononitrate  60 mg Oral Daily  . lisinopril  20 mg Oral Daily  . omega-3 acid ethyl esters  1 g Oral Daily  . potassium chloride SA  20 mEq Oral Daily   Continuous Infusions:   Active Problems:   HTN (hypertension)   Acute ischemic stroke   Diabetes type 2, uncontrolled   Systolic and diastolic CHF, acute on chronic   Acute encephalopathy   Hypoglycemia    Time spent: 40 minutes    Dajon Rowe, J  [facility] Pager [phone]. If 7PM-7AM,  please contact night-coverage at [email or web address], password [facility] 02/10/2013, 4:41 PM  LOS: 1 day

## 2013-02-10 NOTE — Progress Notes (Signed)
PT Cancellation Note  Patient Details Name: Paula [name] MRN: [id number] DOB: [birth year]   Cancelled Treatment:    Reason Eval/Treat Not Completed: Patient at procedure or test/unavailable--gone for head CT.   [name] 02/10/2013, 3:31 PM Pager [phone]

## 2013-02-10 NOTE — Progress Notes (Addendum)
  Echocardiogram 2D Echocardiogram has been performed.  Farrel Demark, RDMS, RVT  02/10/2013, 10:14 AM

## 2013-02-10 NOTE — Progress Notes (Signed)
VASCULAR LAB PRELIMINARY  PRELIMINARY  PRELIMINARY  PRELIMINARY  Carotid duplex  completed.    Preliminary report:  Bilateral:  1-39% ICA stenosis.  Vertebral artery flow is antegrade.      Jovani Flury, RVT 02/10/2013, 11:25 AM

## 2013-02-10 NOTE — Evaluation (Addendum)
Occupational Therapy Evaluation Patient Details Name: Paula [name] MRN: [id number] DOB: 26-Mar-1940 Today's Date: 02/10/2013 Time: 3005-1102 OT Time Calculation (min): 21 min  OT Assessment / Plan / Recommendation History of present illness 73 year old female with a history of combined systolic and diastolic heart failure, hypertension, stroke, nonischemic cardiomyopathy presents with a two-day history of chest pain and shortness of breath. CT of the brain revealed an area of concern for acute infarction in the right cerebellum.    Clinical Impression   Pt presents with below problem list. Feel pt will benefit from acute OT to increase independence and safety prior to d/c. Recommending HHOT and 24/7 supervision/assistance.    OT Assessment  Patient needs continued OT Services    Follow Up Recommendations  Home health OT;Supervision/Assistance - 24 hour    Barriers to Discharge      Equipment Recommendations  None recommended by OT    Recommendations for Other Services    Frequency  Min 2X/week    Precautions / Restrictions Precautions Precautions: Fall Restrictions Weight Bearing Restrictions: No   Pertinent Vitals/Pain No pain reported.    ADL  Grooming: Wash/dry face;Teeth care;Wash/dry hands;Min guard Where Assessed - Grooming: Supported standing;Supported sitting Upper Body Dressing: Set up Where Assessed - Upper Body Dressing: Supported sitting Lower Body Dressing: Min guard Where Assessed - Lower Body Dressing: Unsupported sit to stand Toilet Transfer: Min Pension scheme manager Method: Sit to stand;Stand pivot Acupuncturist: Materials engineer and Hygiene: Min guard Where Assessed - Engineer, mining and Hygiene: Standing;Sit on 3-in-1 or toilet Tub/Shower Transfer Method: Not assessed Equipment Used: Gait belt Transfers/Ambulation Related to ADLs: Min guard ADL Comments: Educated on energy  conservation techniques. Recommended sitting for dressing (as much as she can).  Pt breathing heavily during session-educated on deep breathing technique. Pt performed part of session without O2.    OT Diagnosis: Generalized weakness;Cognitive deficits  OT Problem List: Decreased strength;Decreased activity tolerance;Impaired balance (sitting and/or standing);Decreased knowledge of use of DME or AE;Decreased cognition;Decreased knowledge of precautions;Cardiopulmonary status limiting activity OT Treatment Interventions: Self-care/ADL training;DME and/or AE instruction;Therapeutic activities;Patient/family education;Balance training;Cognitive remediation/compensation;Therapeutic exercise;Energy conservation   OT Goals(Current goals can be found in the care plan section) Acute Rehab OT Goals Patient Stated Goal: not stated OT Goal Formulation: With patient Time For Goal Achievement: 02/17/13 Potential to Achieve Goals: Good ADL Goals Pt Will Perform Grooming: with modified independence;standing Pt Will Transfer to Toilet: with modified independence;ambulating Pt Will Perform Toileting - Clothing Manipulation and hygiene: with modified independence;sit to/from stand Additional ADL Goal #1: Pt will independently utilize 3/3 energy conservation techniques.  Visit Information  Last OT Received On: 02/10/13 Assistance Needed: +1 History of Present Illness: 73 year old female with a history of combined systolic and diastolic heart failure, hypertension, stroke, nonischemic cardiomyopathy presents with a two-day history of chest pain and shortness of breath. CT of the brain revealed an area of concern for acute infarction in the right cerebellum.        Prior Functioning     Home Living Family/patient expects to be discharged to:: Private residence Living Arrangements: Alone Available Help at Discharge: Family Type of Home: House Home Access: Stairs to enter Secretary/administrator of Steps:  3 Entrance Stairs-Rails: None Home Layout: One level Home Equipment: Shower seat;Bedside commode Additional Comments: unsure of accuracy of home setup due to pt's cognition  Lives With: Daughter;Family Prior Function Level of Independence: Needs assistance ADL's / Homemaking Assistance Needed: pt states she needed Min  A for bathing and dressing Communication Communication: No difficulties Dominant Hand: Right         Vision/Perception Vision - History Baseline Vision: Wears glasses only for reading Patient Visual Report:  (burning of eyes last few weeks) Vision - Assessment Vision Assessment: Vision tested Visual Fields: No apparent deficits   Cognition  Cognition Arousal/Alertness: Awake/alert Behavior During Therapy: WFL for tasks assessed/performed Overall Cognitive Status: History of cognitive impairments - at baseline    Extremity/Trunk Assessment Upper Extremity Assessment Upper Extremity Assessment: Generalized weakness Lower Extremity Assessment Lower Extremity Assessment: Defer to PT evaluation     Mobility Bed Mobility Overal bed mobility: Needs Assistance Bed Mobility: Sit to Supine;Supine to Sit Supine to sit: Supervision Sit to supine: Supervision Transfers Overall transfer level: Needs assistance Equipment used: None Transfers: Sit to/from UGI CorporationStand;Stand Pivot Transfers Sit to Stand: Min guard Stand pivot transfers: Min guard General transfer comment: Cues for hand placement.     Exercise     Balance     End of Session OT - End of Session Equipment Utilized During Treatment: Gait belt;Oxygen Activity Tolerance: Patient limited by fatigue Patient left: in bed;with call bell/phone within reach;with bed alarm set Nurse Communication: Mobility status  GO     Earlie RavelingStraub, Bobbye Reinitz L OTR/L [phone] 02/10/2013, 3:52 PM

## 2013-02-10 NOTE — Evaluation (Signed)
Speech Language Pathology Evaluation Patient Details Name: Paula [name] MRN: [id number] DOB: [birth year] Today's Date: 02/10/2013 Time: [id number] SLP Time Calculation (min): 25 min  Problem List:  Patient Active Problem List   Diagnosis Date Noted  . Acute ischemic stroke 02/09/2013  . Diabetes type 2, uncontrolled 02/09/2013  . Systolic and diastolic CHF, acute on chronic 02/09/2013  . Acute encephalopathy 02/09/2013  . Hypoglycemia 02/09/2013  . Acute on chronic systolic heart failure 02/05/2013  . Acute systolic heart failure 02/05/2013  . Hyperglycemia 12/24/2012  . Malnutrition of moderate degree 07/12/2012  . Hyperkalemia, diminished renal excretion 05/27/2012  . AKI (acute kidney injury) 05/27/2012  . DM2 (diabetes mellitus, type 2) 05/27/2012  . HTN (hypertension) 05/27/2012  . Dementia 05/27/2012  . Adult failure to thrive 05/27/2012   Past Medical History:  Past Medical History  Diagnosis Date  . Systolic CHF     Non ischemic, last Cath 09/21/08 - normal left main, LAD, LCx, ramus intermedius, RCA  . Hypertension   . Stroke   . Headache(784.0)   . Diabetes mellitus   . Dementia 05/27/2012  . Osteopenia     DEXA 2012 : T score hip -2.3, femur -2.1  . CHF (congestive heart failure)   . Shortness of breath   . Pacemaker    Past Surgical History:  Past Surgical History  Procedure Laterality Date  . Insert / replace / remove pacemaker  12/15/2010  . Back surgery    . Abdominal hysterectomy     HPI:  73 [facility] with shortness of breath and speech changes. Pt CT head concerning for mid right cerebellum CVA -  Pt for speech and language evaluation.  She reports she lives with her daughter and her daughter is home with her nearly 24/7.  Pt states she enjoys sleeping during her free time and she does not participate in household chores.     Assessment / Plan / Recommendation Clinical Impression  Pt presents with functional cognitive linguistic  skills for current environment and 24/7 supervision.  She wanted to get oob upon slp entrance to room to use restroom - stating she had not been told to use call bell.  Question some memory deficits, ? baseline given neuro hx and age.  Pt was oriented x3 - unable to read clock on wall for actual date - able to state month and year correctly.  Pt reports family does all housework and she enjoys sleeping in her free time.  She was able to verbalize need to use call bell for assist to get up after slp initially demonstrated- after approx 15 minutes.  Reading at sentence level functional and no dysarthria/aphasia noted.  No further SLP indicated as pt will have level of support needed at home per her report- Also review of chart from 7 months ago indicated pt had 24/7 assist.  Please reorder if desire.      SLP Assessment  Patient does not need any further Speech Lanaguage Pathology Services    Follow Up Recommendations  None    Frequency and Duration        Pertinent Vitals/Pain Afebrile, decreased   SLP Goals     SLP Evaluation Prior Functioning  Type of Home: House  Lives With: Daughter;Family Available Help at Discharge: Family;Personal care attendant;Available 24 hours/day Vocation: Retired (worked at a [employer] before retiring)   IT consultantCognition  Overall Cognitive Status: No family/caregiver present to determine baseline cognitive functioning Arousal/Alertness: Awake/alert Orientation Level: Oriented X4  Attention: Sustained Sustained Attention: Appears intact Memory: Appears intact Awareness: Appears intact Problem Solving: Appears intact Safety/Judgment: Appears intact    Comprehension  Auditory Comprehension Overall Auditory Comprehension: Appears within functional limits for tasks assessed Yes/No Questions: Within Functional Limits Commands: Within Functional Limits Conversation: Complex Visual Recognition/Discrimination Discrimination: Within Function Limits Reading  Comprehension Reading Status: Within funtional limits    Expression Written Expression Dominant Hand: Right Written Expression: Not tested   Oral / Motor Oral Motor/Sensory Function Overall Oral Motor/Sensory Function: Impaired Motor Speech Overall Motor Speech: Appears within functional limits for tasks assessed Respiration: Impaired (due to pt's baseline respiratory issues) Level of Impairment: Conversation Phonation: Low vocal intensity Resonance: Within functional limits Articulation: Within functional limitis Intelligibility: Intelligible Motor Planning: Witnin functional limits Motor Speech Errors: Not applicable   GO     Mills Koller, MS [facility] SLP [phone]

## 2013-02-11 DIAGNOSIS — I5023 Acute on chronic systolic (congestive) heart failure: Secondary | ICD-10-CM

## 2013-02-11 LAB — CBC WITH DIFFERENTIAL/PLATELET
Basophils Absolute: 0 10*3/uL (ref 0.0–0.1)
Basophils Relative: 1 % (ref 0–1)
EOS PCT: 1 % (ref 0–5)
Eosinophils Absolute: 0.1 10*3/uL (ref 0.0–0.7)
HCT: 35.7 % — ABNORMAL LOW (ref 36.0–46.0)
Hemoglobin: 11.3 g/dL — ABNORMAL LOW (ref 12.0–15.0)
LYMPHS ABS: 2.2 10*3/uL (ref 0.7–4.0)
LYMPHS PCT: 34 % (ref 12–46)
MCH: 28.9 pg (ref 26.0–34.0)
MCHC: 31.7 g/dL (ref 30.0–36.0)
MCV: 91.3 fL (ref 78.0–100.0)
Monocytes Absolute: 0.6 10*3/uL (ref 0.1–1.0)
Monocytes Relative: 9 % (ref 3–12)
Neutro Abs: 3.5 10*3/uL (ref 1.7–7.7)
Neutrophils Relative %: 56 % (ref 43–77)
Platelets: 255 10*3/uL (ref 150–400)
RBC: 3.91 MIL/uL (ref 3.87–5.11)
RDW: 18.1 % — ABNORMAL HIGH (ref 11.5–15.5)
WBC: 6.4 10*3/uL (ref 4.0–10.5)

## 2013-02-11 LAB — GLUCOSE, CAPILLARY
GLUCOSE-CAPILLARY: 147 mg/dL — AB (ref 70–99)
GLUCOSE-CAPILLARY: 164 mg/dL — AB (ref 70–99)
Glucose-Capillary: 191 mg/dL — ABNORMAL HIGH (ref 70–99)
Glucose-Capillary: 146 mg/dL — ABNORMAL HIGH (ref 70–99)

## 2013-02-11 LAB — COMPREHENSIVE METABOLIC PANEL
ALBUMIN: 3.6 g/dL (ref 3.5–5.2)
ALT: 80 U/L — AB (ref 0–35)
AST: 32 U/L (ref 0–37)
Alkaline Phosphatase: 66 U/L (ref 39–117)
BILIRUBIN TOTAL: 0.5 mg/dL (ref 0.3–1.2)
BUN: 29 mg/dL — ABNORMAL HIGH (ref 6–23)
CHLORIDE: 103 meq/L (ref 96–112)
CO2: 20 mEq/L (ref 19–32)
CREATININE: 1.34 mg/dL — AB (ref 0.50–1.10)
Calcium: 9.4 mg/dL (ref 8.4–10.5)
GFR calc Af Amer: 44 mL/min — ABNORMAL LOW (ref >90)
GFR calc non Af Amer: 38 mL/min — ABNORMAL LOW (ref >90)
Glucose, Bld: 164 mg/dL — ABNORMAL HIGH (ref 70–99)
POTASSIUM: 5.3 meq/L (ref 3.7–5.3)
SODIUM: 137 meq/L (ref 137–147)
Total Protein: 6.6 g/dL (ref 6.0–8.3)

## 2013-02-11 LAB — MAGNESIUM: Magnesium: 2.2 mg/dL (ref 1.5–2.5)

## 2013-02-11 MED ORDER — ONDANSETRON HCL 4 MG/2ML IJ SOLN: 4.0000 mg | Freq: Four times a day (QID) | INTRAMUSCULAR | Status: DC | PRN

## 2013-02-11 MED ORDER — ONDANSETRON HCL 4 MG/2ML IJ SOLN
INTRAMUSCULAR | Status: AC
  Administered 2013-02-11: 4 mg
  Filled 2013-02-11: qty 2

## 2013-02-11 MED ORDER — ASPIRIN 325 MG PO TABS
325.0000 mg | ORAL_TABLET | Freq: Every day | ORAL | Status: DC
  Administered 2013-02-11 – 2013-02-14 (×4): 325 mg via ORAL
  Filled 2013-02-11 (×4): qty 1

## 2013-02-11 MED ORDER — FUROSEMIDE 10 MG/ML IJ SOLN
40.0000 mg | Freq: Two times a day (BID) | INTRAMUSCULAR | Status: DC
  Administered 2013-02-11 – 2013-02-12 (×3): 40 mg via INTRAVENOUS
  Filled 2013-02-11 (×6): qty 4

## 2013-02-11 NOTE — Progress Notes (Signed)
[facility] PROGRESS NOTE  Paula [name] ZOX:[id number] DOB: 1940-02-12 DOA: 02/09/2013 PCP: [name], MD  Assessment/Plan: #1 acute encephalopathy Likely multifactorial secondary to medical issues such as acute CHF exacerbation. Patient with resolution of symptoms in the setting of negative imaging. CT angiogram of the head and neck was negative for any acute abnormalities. Patient was on aspirin prior to admission was subsequently changed to Plavix. Patient to change back to aspirin per neurology recommendations. Clinical improvement. Continue diuresis with IV Lasix today. Follow.  #2 acute on chronic systolic heart failure Patient presented with shortness of breath on exertion.pro BNP was elevated at 5031. Patient's current weight is 82.78 kg. Doubt if patient is being weighed accurately. I/O = -70. Strict I.'s and O. Daily weights. Cardiac enzymes are negative x2. LDL is a 59. 2-D echo with EF of 25% with diffuse hypokinesis which is unchanged from prior 2-D echo.. Continue IV Lasix, aspirin, Coreg, Lipitor, Imdur, lisinopril. Will likely need outpatient followup.  #3 hyperlipidemia Continue Lipitor.  #4 atypical chest pain Resolved. Cardiac enzymes negative x2. EKG is unchanged with a left bundle branch block. 2-D echo with EF of 25% with diffuse hypokinesis which is unchanged from prior 2-D echo. Cardiology was consulted secondary to patient reporting ICD fired several times prior to admission and patient to be seen by cardiology today.  #5 diabetes mellitus Patient noted to be hypoglycemic and a such Levemir was discontinued. Hemoglobin A1c is 8.5. CBGs have ranged from 147-191. Continue sliding scale insulin.  #6. Hypertension Stable. Continue lisinopril, Coreg, Imdur.  #7 prophylaxis Lovenox for DVT prophylaxis.  Code Status: full Family Communication: updated patient no family at bedside. Disposition Plan: home when medically  stable.   Consultants:  Neurology: Dr.Stewart 02/09/2013  Procedures:  CT angiogram head and neck 02/10/2013  CT of the head 2/9/ 2015  Chest x-ray 02/09/2013  Carotid Dopplers 02/10/2013  2-D echo 02/10/2013  Antibiotics:  none  HPI/Subjective: Patient states she's feeling much better. Patient states breathing is better. Patient alert and oriented x3.  Objective: Filed Vitals:   02/11/13 1058  BP: 124/93  Pulse: 70  Temp: 97.6 F (36.4 C)  Resp: 20    Intake/Output Summary (Last 24 hours) at 02/11/13 1241 Last data filed at 02/11/13 0850  Gross per 24 hour  Intake    740 ml  Output    550 ml  Net    190 ml   Filed Weights   02/09/13 1843 02/10/13 0546  Weight: 82.9 kg (182 lb 12.2 oz) 82.781 kg (182 lb 8 oz)    Exam:   General:  NAD  Cardiovascular: RRR  Respiratory: DECREASED BREATH SOUNDS IN THE BASES.  Abdomen: soft, nontender, nondistended, positive bowel sounds.  Musculoskeletal: no clubbing cyanosis. Trace bilateral lower extremity edema  Data Reviewed: Basic Metabolic Panel:  Recent Labs Lab 02/05/13 0544 02/06/13 0304 02/09/13 1459 02/10/13 1815 02/11/13 0430  NA 142 137 139 133* 137  K 4.1 4.3 4.7 4.6 5.3  CL 108 104 104 100 103  CO2 20 17* 20 19 20   GLUCOSE 116* 157* 65* 219* 164*  BUN 18 24* 25* 32* 29*  CREATININE 1.10 1.32* 1.10 1.50* 1.34*  CALCIUM 9.7 9.5 9.6 9.2 9.4  MG  --   --   --  2.1 2.2   Liver Function Tests:  Recent Labs Lab 02/05/13 0544 02/06/13 0304 02/10/13 1815 02/11/13 0430  AST 47* 42* 36 32  ALT 76* 76* 83* 80*  ALKPHOS 59 58 62  66  BILITOT 0.3 0.4 0.4 0.5  PROT 6.5 6.5 6.0 6.6  ALBUMIN 3.5 3.6 3.2* 3.6   No results found for this basename: LIPASE, AMYLASE,  in the last 168 hours No results found for this basename: AMMONIA,  in the last 168 hours CBC:  Recent Labs Lab 02/05/13 0544 02/09/13 1459 02/11/13 0430  WBC 6.0 8.6 6.4  NEUTROABS  --  4.2 3.5  HGB 11.4* 11.1* 11.3*  HCT  36.1 34.0* 35.7*  MCV 90.0 89.5 91.3  PLT 228 286 255   Cardiac Enzymes:  Recent Labs Lab 02/05/13 0544 02/10/13 0005 02/10/13 0721  TROPONINI <0.30 <0.30 <0.30   BNP (last 3 results)  Recent Labs  07/11/12 1526 02/05/13 0544 02/09/13 1439  PROBNP 299.3* 4211.0* 5031.0*   CBG:  Recent Labs Lab 02/10/13 0714 02/10/13 1135 02/10/13 1645 02/10/13 2121 02/11/13 0640  GLUCAP 73 124* 159* 172* 147*    No results found for this or any previous visit (from the past 240 hour(s)).   Studies: Ct Angio Head W/cm &/or Wo Cm  02/10/2013   CLINICAL DATA:  Vertigo and slurred speech. Concern for acute stroke.  EXAM: CT ANGIOGRAPHY HEAD AND NECK  TECHNIQUE: Multidetector CT imaging of the head and neck was performed using the standard protocol during bolus administration of intravenous contrast. Multiplanar CT image reconstructions and MIPs were obtained to evaluate the vascular anatomy. Carotid stenosis measurements (when applicable) are obtained utilizing NASCET criteria, using the distal internal carotid diameter as the denominator.  CONTRAST:  50mL OMNIPAQUE IOHEXOL 350 MG/ML SOLN  COMPARISON:  Head CT 02/09/2013.  Head MRI and MRA 11/17/2002.  FINDINGS: CTA HEAD FINDINGS  The distal right vertebral artery is hypoplastic and ends in PICA. Distal left vertebral artery is patent and contains a fenestration in its proximal V4 segment. Left PICA origin is patent. Basilar artery is mildly small in caliber diffusely but patent and without evidence of significant focal stenosis. SCA origins are patent bilaterally. There are hypoplastic P1 segments bilaterally with prominent bilateral posterior communicating arteries. There is very mild irregularity of the right P2 segment. There is mild irregularity and mild to moderate narrowing of the proximal to mid left P2 segment. The internal carotid arteries are patent from skullbase to carotid termini. ACA and MCA origins are patent. Right A1 segment  appears mildly hypoplastic. There is very mild irregularity of ACA and MCA branches bilaterally without evidence of significant proximal stenosis. No intracranial aneurysm is identified.  Hypoattenuation in the inferior right cerebellum is unchanged from recent noncontrast head CT. Remote right basal ganglia lacunar infarct is unchanged. Cerebral atrophy is unchanged. Confluent periventricular white matter hypodensities with greater involvement of the left cerebellar hemisphere compared to the right are unchanged and compatible with moderate chronic small vessel ischemic disease. There is no evidence of mass, midline shift, intracranial hemorrhage, or extra-axial fluid collection. No abnormal parenchymal or meningeal enhancement is identified after the IV administration of contrast material. Orbits are unremarkable. Visualized mastoid air cells and paranasal sinuses are clear.  Review of the MIP images confirms the above findings.  CTA NECK FINDINGS  Incidental note is made of a common origin of the innominate and left common carotid arteries. Mild aortic arch calcification is noted including involving the proximal left subclavian artery. Evaluation of the proximal arch vessels is mildly limited by streak artifact from patient's pacemaker generator and leads. The common carotid arteries are patent without stenosis. There is minimal atherosclerotic calcification at the left carotid bifurcation without  stenosis. Cervical internal carotid arteries are patent and unremarkable.  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 The proximal aspect of the right vertebral artery is difficult to fully evaluate due to its small caliber and artifact in this region, however the remainder of the right vertebral artery appears patent. The left vertebral artery is patent and dominant without significant stenosis identified.  The soft tissues of the neck are grossly unremarkable.  Mild to moderate disc space narrowing and endplate spurring are present in the mid and lower cervical spine.  Review of the MIP images confirms the above findings.  IMPRESSION: 1. No evidence of cervical carotid stenosis. 2. 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 Dominant left vertebral artery without stenosis. 3. Mild intracranial arterial irregularity suggestive of atherosclerosis with mild to moderate narrowing of the left P2 segment. 4. Unchanged appearance of the brain.   Electronically Signed   By: Sebastian Ache   On: 02/10/2013 16:28   Dg Chest 2 View  02/09/2013   CLINICAL DATA:  Shortness of breath and weakness  EXAM: CHEST  2 VIEW  COMPARISON:  02/05/2013  FINDINGS: Cardiac shadow remains enlarged. A defibrillator is again seen. Lungs are well aerated with a mild bibasilar atelectatic changes. The overall appearance is stable from the previous exam. No focal confluent infiltrate is seen. Marland Kitchen  IMPRESSION: Stable bibasilar atelectatic changes.   Electronically Signed   By: Alcide Clever M.D.   On: 02/09/2013 13:00   Ct Head Wo Contrast  02/09/2013   CLINICAL DATA:  Altered mental status  EXAM: CT HEAD WITHOUT CONTRAST  TECHNIQUE: Contiguous axial images were obtained from the base of the skull through the vertex without intravenous contrast. Study was obtained within 24 hr of patient's arrival at the emergency department.  COMPARISON:  July 11, 2012  FINDINGS: There is moderate diffuse atrophy. There is no demonstrable mass, hemorrhage, extra-axial fluid collection, or midline shift. There is extensive small vessel disease throughout the centra semiovale bilaterally. There is evidence of a prior small infarct in the globus pallidum on the right, a stable finding.  There is subtle decreased attenuation in the mid right cerebellum, and area concerning for recent infarct. No other findings suspicious for acute infarct.  The bony calvarium appears intact. The mastoid air cells are clear. There is  rightward deviation of the nasal septum.  IMPRESSION: Area concerning for acute infarct in the mid right cerebellum in the region of the right dentate nucleus. No other evidence suspicious for potential acute infarct. No hemorrhage or mass effect. There is atrophy with extensive supratentorial small vessel disease. There is a prior small lacunar infarct in the right globus pallidum, stable.   Electronically Signed   By: Bretta Bang M.D.   On: 02/09/2013 13:29   Ct Angio Neck W/cm &/or Wo/cm  02/10/2013   CLINICAL DATA:  Vertigo and slurred speech. Concern for acute stroke.  EXAM: CT ANGIOGRAPHY HEAD AND NECK  TECHNIQUE: Multidetector CT imaging of the head and neck was performed using the standard protocol during bolus administration of intravenous contrast. Multiplanar CT image reconstructions and MIPs were obtained to evaluate the vascular anatomy. Carotid stenosis measurements (when applicable) are obtained utilizing NASCET criteria, using the distal internal carotid diameter as the denominator.  CONTRAST:  1mL OMNIPAQUE IOHEXOL 350 MG/ML SOLN  COMPARISON:  Head CT 02/09/2013.  Head MRI and MRA 11/17/2002.  FINDINGS: CTA HEAD FINDINGS  The distal right vertebral artery is hypoplastic and  ends in PICA. Distal left vertebral artery is patent and contains a fenestration in its proximal V4 segment. Left PICA origin is patent. Basilar artery is mildly small in caliber diffusely but patent and without evidence of significant focal stenosis. SCA origins are patent bilaterally. There are hypoplastic P1 segments bilaterally with prominent bilateral posterior communicating arteries. There is very mild irregularity of the right P2 segment. There is mild irregularity and mild to moderate narrowing of the proximal to mid left P2 segment. The internal carotid arteries are patent from skullbase to carotid termini. ACA and MCA origins are patent. Right A1 segment appears mildly hypoplastic. There is very mild  irregularity of ACA and MCA branches bilaterally without evidence of significant proximal stenosis. No intracranial aneurysm is identified.  Hypoattenuation in the inferior right cerebellum is unchanged from recent noncontrast head CT. Remote right basal ganglia lacunar infarct is unchanged. Cerebral atrophy is unchanged. Confluent periventricular white matter hypodensities with greater involvement of the left cerebellar hemisphere compared to the right are unchanged and compatible with moderate chronic small vessel ischemic disease. There is no evidence of mass, midline shift, intracranial hemorrhage, or extra-axial fluid collection. No abnormal parenchymal or meningeal enhancement is identified after the IV administration of contrast material. Orbits are unremarkable. Visualized mastoid air cells and paranasal sinuses are clear.  Review of the MIP images confirms the above findings.  CTA NECK FINDINGS  Incidental note is made of a common origin of the innominate and left common carotid arteries. Mild aortic arch calcification is noted including involving the proximal left subclavian artery. Evaluation of the proximal arch vessels is mildly limited by streak artifact from patient's pacemaker generator and leads. The common carotid arteries are patent without stenosis. There is minimal atherosclerotic calcification at the left carotid bifurcation without stenosis. Cervical internal carotid arteries are patent and unremarkable.  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 The proximal aspect of the right vertebral artery is difficult to fully evaluate due to its small caliber and artifact in this region, however the remainder of the right vertebral artery appears patent. The left vertebral artery is patent and dominant without significant stenosis identified.  The soft tissues of the neck are grossly unremarkable. Mild to moderate disc space narrowing and  endplate spurring are present in the mid and lower cervical spine.  Review of the MIP images confirms the above findings.  IMPRESSION: 1. No evidence of cervical carotid stenosis. 2. 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 Dominant left vertebral artery without stenosis. 3. Mild intracranial arterial irregularity suggestive of atherosclerosis with mild to moderate narrowing of the left P2 segment. 4. Unchanged appearance of the brain.   Electronically Signed   By: Sebastian Ache   On: 02/10/2013 16:28    Scheduled Meds: . aspirin  325 mg Oral Daily  . atorvastatin  40 mg Oral Daily  . carvedilol  3.125 mg Oral BID WC  . enoxaparin (LOVENOX) injection  40 mg Subcutaneous Q24H  . fenofibrate  160 mg Oral Daily  . furosemide  40 mg Intravenous Daily  . insulin aspart  0-15 Units Subcutaneous TID WC  . isosorbide mononitrate  60 mg Oral Daily  . lisinopril  20 mg Oral Daily  . omega-3 acid ethyl esters  1 g Oral Daily  . potassium chloride SA  20 mEq Oral Daily   Continuous Infusions:   Active Problems:   HTN (hypertension)   Acute ischemic stroke   Diabetes type  2, uncontrolled   Systolic and diastolic CHF, acute on chronic   Acute encephalopathy   Hypoglycemia    Time spent: 40 mins    Rishika Mccollom M.D. [facility] Pager [phone]. If 7PM-7AM, please contact night-coverage at [email or web address], password [facility] 02/11/2013, 12:41 PM  LOS: 2 days

## 2013-02-11 NOTE — Consult Note (Signed)
Signing Out [place], [place] , [place]

## 2013-02-11 NOTE — Evaluation (Signed)
Physical Therapy Evaluation Patient Details Name: Paula [name] MRN: [id number] DOB: Oct 08, 1940 Today's Date: 02/11/2013 Time: [id number] PT Time Calculation (min): 28 min  PT Assessment / Plan / Recommendation History of Present Illness  73 year old female with a history of combined systolic and diastolic heart failure, hypertension, stroke, nonischemic cardiomyopathy presents with a two-day history of chest pain and shortness of breath. CT of the brain revealed an area of concern for acute infarction in the right cerebellum.   Clinical Impression  Patient demonstrates deficits in functional mobility and activity tolerance as indicated below. Will benefit from skilled PT to address deficits and maximize function.  Will see as indicated and progress as tolerated.  OF NOTE: Patient does present with consistent cerebellar dysfunction reporting "drunken headed" feeling during ambulation with poor activity tolerance. Will need supervision upon discharge (pt states that she has someone that stays with her all the time)    PT Assessment  Patient needs continued PT services    Follow Up Recommendations  Home health PT;Supervision/Assistance - 24 hour          Equipment Recommendations  None recommended by PT    Recommendations for Other Services     Frequency Min 4X/week    Precautions / Restrictions Precautions Precautions: Fall Restrictions Weight Bearing Restrictions: No   Pertinent Vitals/Pain VSS, NAD      Mobility  Bed Mobility Overal bed mobility: Needs Assistance Bed Mobility: Sit to Supine;Supine to Sit Supine to sit: Supervision Sit to supine: Supervision Transfers Overall transfer level: Needs assistance Equipment used: None Transfers: Sit to/from Stand Sit to Stand: Supervision Ambulation/Gait Ambulation/Gait assistance: Supervision Ambulation Distance (Feet): 210 Feet Assistive device: None Gait Pattern/deviations: Drifts right/left Gait velocity: modestly  decreased Gait velocity interpretation: Below normal speed for age/gender General Gait Details: steady with ambulation, complains of "drunked headed" feeling consistent with cerebellar infarct Modified Rankin (Stroke Patients Only) Pre-Morbid Rankin Score: No significant disability Modified Rankin: Moderate disability    Exercises     PT Diagnosis: Difficulty walking;Abnormality of gait;Generalized weakness  PT Problem List: Decreased strength;Decreased activity tolerance;Decreased balance;Decreased mobility;Pain PT Treatment Interventions: DME instruction;Gait training;Stair training;Functional mobility training;Therapeutic activities;Therapeutic exercise;Balance training;Patient/family education     PT Goals(Current goals can be found in the care plan section) Acute Rehab PT Goals Patient Stated Goal: to go home PT Goal Formulation: With patient Time For Goal Achievement: 02/25/13 Potential to Achieve Goals: Good  Visit Information  Last PT Received On: 02/11/13 Assistance Needed: +1 History of Present Illness: 73 year old female with a history of combined systolic and diastolic heart failure, hypertension, stroke, nonischemic cardiomyopathy presents with a two-day history of chest pain and shortness of breath. CT of the brain revealed an area of concern for acute infarction in the right cerebellum.        Prior Functioning  Home Living Family/patient expects to be discharged to:: Private residence Living Arrangements: Alone Available Help at Discharge: Family Type of Home: House Home Access: Stairs to enter Secretary/administratorntrance Stairs-Number of Steps: 3 Entrance Stairs-Rails: None Home Layout: One level Home Equipment: Shower seat;Bedside commode Additional Comments: unsure of accuracy of home setup due to pt's cognition  Lives With: Daughter;Family (patient states she has a nurse that stays with her) Prior Function Level of Independence: Needs assistance ADL's / Homemaking  Assistance Needed: pt states she needed Min A for bathing and dressing Communication Communication: No difficulties Dominant Hand: Right    Cognition  Cognition Arousal/Alertness: Awake/alert Behavior During Therapy: WFL for tasks assessed/performed Overall Cognitive Status:  History of cognitive impairments - at baseline    Extremity/Trunk Assessment Lower Extremity Assessment Lower Extremity Assessment: Generalized weakness   Balance General Comments General comments (skin integrity, edema, etc.): some modest instability noted, patient able to self correct, no physical need for assist  End of Session PT - End of Session Equipment Utilized During Treatment: Gait belt Activity Tolerance: Patient tolerated treatment well;Other (comment) (limited by drunken headed feeling) Patient left: in bed;with call bell/phone within reach;with bed alarm set Nurse Communication: Mobility status  GP     Fabio Asa 02/11/2013, 10:01 AM Charlotte Crumb, PT DPT  [phone]

## 2013-02-11 NOTE — Progress Notes (Signed)
Stroke Team Progress Note  HISTORY Paula [name] is an 73 y.o. Female who present with blisters systolic congestive heart failure, diabetes mellitus, hypertension and hyperlipidemia who was brought to the hospital with a complaint of chest pain and shortness of breath and an uncontrollable coughing. Her daughter also noted that speech was slurred. Patient indicated that she had vertigo when she woke up this morning. She was last known well at about 9 PM last night. CT scan of her head showed low-density area involving the left cerebellum indicative of probable acute ischemic stroke. Patient has a history of previous lacunar stroke involving right globus pallidus. NIH stroke score was 1 for slightly slurred speech. She's been taking aspirin daily 325 mg.  Patient was not administerd TPA secondary to Minimal deficits in beyond time window for treatment consideration. She was admitted for further evaluation and treatment.  SUBJECTIVE Patient sitting up in bed. States she is feeling better today.  OBJECTIVE Most recent Vital Signs: Filed Vitals:   02/11/13 0130 02/11/13 0516 02/11/13 0729 02/11/13 0945  BP: 127/73 115/59 134/92 106/67  Pulse: 90 71 79   Temp: 97.5 F (36.4 C) 98.1 F (36.7 C) 98.1 F (36.7 C)   TempSrc: Oral Oral Oral   Resp:  18 19   Height:      Weight:      SpO2: 99% 100% 97%    CBG (last 3)   Recent Labs  02/10/13 1645 02/10/13 2121 02/11/13 0640  GLUCAP 159* 172* 147*    IV Fluid Intake:     MEDICATIONS  . atorvastatin  40 mg Oral Daily  . carvedilol  3.125 mg Oral BID WC  . clopidogrel  75 mg Oral Q breakfast  . enoxaparin (LOVENOX) injection  40 mg Subcutaneous Q24H  . fenofibrate  160 mg Oral Daily  . furosemide  40 mg Intravenous Daily  . insulin aspart  0-15 Units Subcutaneous TID WC  . isosorbide mononitrate  60 mg Oral Daily  . lisinopril  20 mg Oral Daily  . omega-3 acid ethyl esters  1 g Oral Daily  . potassium chloride SA  20 mEq Oral  Daily   PRN:  acetaminophen, acetaminophen, nitroGLYCERIN, ondansetron (ZOFRAN) IV, senna-docusate  Diet:  Carb Control thin liquids Activity:   DVT Prophylaxis:  Lovenox 40 mg sq daily  CLINICALLY SIGNIFICANT STUDIES Basic Metabolic Panel:   Recent Labs Lab 02/10/13 1815 02/11/13 0430  NA 133* 137  K 4.6 5.3  CL 100 103  CO2 19 20  GLUCOSE 219* 164*  BUN 32* 29*  CREATININE 1.50* 1.34*  CALCIUM 9.2 9.4  MG 2.1 2.2   Liver Function Tests:   Recent Labs Lab 02/10/13 1815 02/11/13 0430  AST 36 32  ALT 83* 80*  ALKPHOS 62 66  BILITOT 0.4 0.5  PROT 6.0 6.6  ALBUMIN 3.2* 3.6   CBC:   Recent Labs Lab 02/09/13 1459 02/11/13 0430  WBC 8.6 6.4  NEUTROABS 4.2 3.5  HGB 11.1* 11.3*  HCT 34.0* 35.7*  MCV 89.5 91.3  PLT 286 255   Coagulation:   Recent Labs Lab 02/09/13 1439  LABPROT 13.9  INR 1.09   Cardiac Enzymes:   Recent Labs Lab 02/05/13 0544 02/10/13 0005 02/10/13 0721  TROPONINI <0.30 <0.30 <0.30   Urinalysis:   Recent Labs Lab 02/09/13 1628 02/09/13 2123  COLORURINE YELLOW YELLOW  LABSPEC 1.030 1.018  PHURINE 5.0 5.0  GLUCOSEU NEGATIVE NEGATIVE  HGBUR NEGATIVE NEGATIVE  BILIRUBINUR NEGATIVE NEGATIVE  KETONESUR NEGATIVE  NEGATIVE  PROTEINUR >300* 100*  UROBILINOGEN 1.0 1.0  NITRITE NEGATIVE NEGATIVE  LEUKOCYTESUR NEGATIVE NEGATIVE   Lipid Panel    Component Value Date/Time   CHOL 111 02/10/2013 0721   TRIG 105 02/10/2013 0721   HDL 31* 02/10/2013 0721   CHOLHDL 3.6 02/10/2013 0721   VLDL 21 02/10/2013 0721   LDLCALC 59 02/10/2013 0721   HgbA1C  Lab Results  Component Value Date   HGBA1C 8.5* 02/10/2013    Urine Drug Screen:     Component Value Date/Time   LABOPIA NONE DETECTED 02/09/2013 2123   COCAINSCRNUR NONE DETECTED 02/09/2013 2123   LABBENZ NONE DETECTED 02/09/2013 2123   AMPHETMU NONE DETECTED 02/09/2013 2123   THCU NONE DETECTED 02/09/2013 2123   LABBARB NONE DETECTED 02/09/2013 2123    Alcohol Level: No results found for this  basename: ETH,  in the last 168 hours   CT of the brain  02/09/2013  Area concerning for acute infarct in the mid right cerebellum in the region of the right dentate nucleus. No other evidence suspicious for potential acute infarct. No hemorrhage or mass effect. There is atrophy with extensive supratentorial small vessel disease. There is a prior small lacunar infarct in the right globus pallidum, stable.   MRI/A of the brain  pacemaker  CTA of the head and neck   1. No evidence of cervical carotid stenosis. 2. 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 Dominant left vertebral artery without stenosis. 3. Mild intracranial arterial irregularity suggestive of atherosclerosis with mild to moderate narrowing of the left P2 segment. 4. Unchanged  appearance of the brain.  2D Echocardiogram  EF 25%, diffuse hypokinesis with no source of embolus.   Carotid Doppler  No evidence of hemodynamically significant internal carotid artery stenosis. Vertebral artery flow is antegrade.   CXR  02/09/2013    Stable bibasilar atelectatic changes.  EKG  normal sinus rhythm. For complete results please see formal report.   Therapy Recommendations   Physical Exam   Short of breath at rest with minimal exertion. On oxygen nasal canula. t. Afebrile. Head is nontraumatic. Neck is supple without bruit. Hearing is normal. Cardiac exam no murmur or gallop. Lungs are clear to auscultation. Distal pulses are well felt. Neurological Exam ;  Awake  Alert oriented x 3. Normal speech and language.eye movements full without nystagmus.fundi were not visualized. Vision acuity and fields appear normal. Hearing is normal. Palatal movements are normal. Face symmetric. Tongue midline. Normal strength, tone, reflexes and coordination. Normal sensation. Gait deferred.  ASSESSMENT Paula [name] is a 73 y.o. female presenting with dizziness and speech change. Imaging negative for acute stroke. Given resolution of  symptoms in setting of negative imaging, suspect symptoms related to medical illness (CHF w/ exacerbation). No stroke or TIA diagnoses. On aspirin 325 mg orally every day prior to admission. Now on clopidogrel 75 mg orally every day for secondary stroke prevention. Patient with no resultant focal weakness. Work up underway.  hypertension  Diabetes, HgbA1c 12.7 in Dec, goal < 7.0 Hyperlipidemia, LDL 59, on lipitor 40 daily PTA, now on lipitor 40 mg daily, goal LDL < 100 (< 70 for diabetics) Acute on chronic systolic heart failure - becomes fatigued with only minimal movement Atypical chest pain Baseline dementia Chronic kidney disease, stage 3, Cr 1.1 pacemaker  Hospital day # 2  TREATMENT/PLAN  change  clopidogrel 75 mg orally every day back to aspirin 325 mg daily as prior to admission for secondary stroke prevention.  Home health PT and OT recommended  No further stroke workup indicated. Ongoing risk factor control by Primary Care Physician Stroke Service will sign off. Please call should any needs arise. Neuro/stroke follow up no necessary.  Annie MainSHARON BIBY, MSN, RN, ANVP-BC, ANP-BC, Lawernce IonGNP-BC [facility] Pager: [phone] 02/11/2013 10:03 AM  I have personally obtained a history, examined the patient, evaluated imaging results, and formulated the assessment and plan of care. I agree with the above.  Elspeth ChoPeter Jaxsun Ciampi, DO Neurology-Stroke

## 2013-02-12 LAB — COMPREHENSIVE METABOLIC PANEL
ALT: 58 U/L — ABNORMAL HIGH (ref 0–35)
AST: 17 U/L (ref 0–37)
Albumin: 3.6 g/dL (ref 3.5–5.2)
Alkaline Phosphatase: 58 U/L (ref 39–117)
BUN: 29 mg/dL — AB (ref 6–23)
CALCIUM: 9.2 mg/dL (ref 8.4–10.5)
CO2: 24 meq/L (ref 19–32)
Chloride: 99 mEq/L (ref 96–112)
Creatinine, Ser: 1.29 mg/dL — ABNORMAL HIGH (ref 0.50–1.10)
GFR, EST AFRICAN AMERICAN: 46 mL/min — AB (ref >90)
GFR, EST NON AFRICAN AMERICAN: 40 mL/min — AB (ref >90)
GLUCOSE: 168 mg/dL — AB (ref 70–99)
Potassium: 4.8 mEq/L (ref 3.7–5.3)
Sodium: 136 mEq/L — ABNORMAL LOW (ref 137–147)
Total Bilirubin: 0.3 mg/dL (ref 0.3–1.2)
Total Protein: 6.6 g/dL (ref 6.0–8.3)

## 2013-02-12 LAB — GLUCOSE, CAPILLARY
GLUCOSE-CAPILLARY: 194 mg/dL — AB (ref 70–99)
GLUCOSE-CAPILLARY: 126 mg/dL — AB (ref 70–99)
Glucose-Capillary: 131 mg/dL — ABNORMAL HIGH (ref 70–99)
Glucose-Capillary: 132 mg/dL — ABNORMAL HIGH (ref 70–99)
Glucose-Capillary: 140 mg/dL — ABNORMAL HIGH (ref 70–99)

## 2013-02-12 LAB — CBC WITH DIFFERENTIAL/PLATELET
BASOS ABS: 0 10*3/uL (ref 0.0–0.1)
Basophils Relative: 0 % (ref 0–1)
Eosinophils Absolute: 0.1 10*3/uL (ref 0.0–0.7)
Eosinophils Relative: 2 % (ref 0–5)
HCT: 36.1 % (ref 36.0–46.0)
Hemoglobin: 11.2 g/dL — ABNORMAL LOW (ref 12.0–15.0)
LYMPHS PCT: 39 % (ref 12–46)
Lymphs Abs: 2.1 10*3/uL (ref 0.7–4.0)
MCH: 28.3 pg (ref 26.0–34.0)
MCHC: 31 g/dL (ref 30.0–36.0)
MCV: 91.2 fL (ref 78.0–100.0)
Monocytes Absolute: 0.6 10*3/uL (ref 0.1–1.0)
Monocytes Relative: 10 % (ref 3–12)
NEUTROS PCT: 48 % (ref 43–77)
Neutro Abs: 2.6 10*3/uL (ref 1.7–7.7)
PLATELETS: 268 10*3/uL (ref 150–400)
RBC: 3.96 MIL/uL (ref 3.87–5.11)
RDW: 17.9 % — AB (ref 11.5–15.5)
WBC: 5.4 10*3/uL (ref 4.0–10.5)

## 2013-02-12 LAB — PRO B NATRIURETIC PEPTIDE: Pro B Natriuretic peptide (BNP): 1839 pg/mL — ABNORMAL HIGH (ref 0–125)

## 2013-02-12 LAB — MAGNESIUM: MAGNESIUM: 2.1 mg/dL (ref 1.5–2.5)

## 2013-02-12 NOTE — Consult Note (Signed)
Paula [name] [employer] Wendelyn Breslow, EdD

## 2013-02-12 NOTE — Progress Notes (Signed)
[facility] PROGRESS NOTE  Paula [name] ZOX:[id number] DOB: 1940-07-20 DOA: 02/09/2013 PCP: [name],[name], MD  Assessment/Plan: #1 acute encephalopathy Likely multifactorial secondary to medical issues such as acute CHF exacerbation. Patient with resolution of symptoms in the setting of negative imaging. CT angiogram of the head and neck was negative for any acute abnormalities. Patient was on aspirin prior to admission was subsequently changed to Plavix. Patient to change back to aspirin per neurology recommendations. Clinical improvement. Continue diuresis with IV Lasix today. Follow.  #2 acute on chronic systolic heart failure Patient presented with shortness of breath on exertion.pro BNP was elevated at 5031. Patient's current weight is 80.922kg from 82.78 kg. Doubt if patient is being weighed accurately. I/O = -70. Strict I.'s and O. Daily weights. Cardiac enzymes are negative x2. LDL is a 59. 2-D echo with EF of 25% with diffuse hypokinesis which is unchanged from prior 2-D echo.. Continue IV Lasix, aspirin, Coreg, Lipitor, Imdur, lisinopril. Will likely need outpatient followup.  #3 hyperlipidemia Continue Lipitor.  #4 atypical chest pain Resolved. Cardiac enzymes negative x2. EKG is unchanged with a left bundle branch block. 2-D echo with EF of 25% with diffuse hypokinesis which is unchanged from prior 2-D echo. Cardiology was consulted secondary to patient reporting ICD fired several times prior to admission and patient to be seen by cardiology today.  #5 diabetes mellitus Patient noted to be hypoglycemic and a such Levemir was discontinued. Hemoglobin A1c is 8.5. CBGs have ranged from 131-191. Continue sliding scale insulin.  #6. Hypertension Stable. Continue lisinopril, Coreg, Imdur.  #7 prophylaxis Lovenox for DVT prophylaxis.  Code Status: full Family Communication: updated patient no family at bedside. Disposition Plan: home when medically  stable.   Consultants:  Neurology: Dr.Stewart 02/09/2013  Procedures:  CT angiogram head and neck 02/10/2013  CT of the head 2/9/ 2015  Chest x-ray 02/09/2013  Carotid Dopplers 02/10/2013  2-D echo 02/10/2013  Antibiotics:  none  HPI/Subjective: Patient states she's feeling much better. Patient states breathing is better. Patient alert and oriented x3.  Objective: Filed Vitals:   02/12/13 1305  BP: 112/56  Pulse: 64  Temp: 98.1 F (36.7 C)  Resp: 20    Intake/Output Summary (Last 24 hours) at 02/12/13 1700 Last data filed at 02/12/13 1400  Gross per 24 hour  Intake    240 ml  Output    600 ml  Net   -360 ml   Filed Weights   02/09/13 1843 02/10/13 0546 02/12/13 0955  Weight: 82.9 kg (182 lb 12.2 oz) 82.781 kg (182 lb 8 oz) 80.922 kg (178 lb 6.4 oz)    Exam:   General:  NAD  Cardiovascular: RRR  Respiratory: DECREASED BREATH SOUNDS IN THE BASES.  Abdomen: soft, nontender, nondistended, positive bowel sounds.  Musculoskeletal: no clubbing cyanosis. Trace bilateral lower extremity edema  Data Reviewed: Basic Metabolic Panel:  Recent Labs Lab 02/06/13 0304 02/09/13 1459 02/10/13 1815 02/11/13 0430 02/12/13 0517  NA 137 139 133* 137 136*  K 4.3 4.7 4.6 5.3 4.8  CL 104 104 100 103 99  CO2 17* 20 19 20 24   GLUCOSE 157* 65* 219* 164* 168*  BUN 24* 25* 32* 29* 29*  CREATININE 1.32* 1.10 1.50* 1.34* 1.29*  CALCIUM 9.5 9.6 9.2 9.4 9.2  MG  --   --  2.1 2.2 2.1   Liver Function Tests:  Recent Labs Lab 02/06/13 0304 02/10/13 1815 02/11/13 0430 02/12/13 0517  AST 42* 36 32 17  ALT 76* 83*  80* 58*  ALKPHOS 58 62 66 58  BILITOT 0.4 0.4 0.5 0.3  PROT 6.5 6.0 6.6 6.6  ALBUMIN 3.6 3.2* 3.6 3.6   No results found for this basename: LIPASE, AMYLASE,  in the last 168 hours No results found for this basename: AMMONIA,  in the last 168 hours CBC:  Recent Labs Lab 02/09/13 1459 02/11/13 0430 02/12/13 0517  WBC 8.6 6.4 5.4  NEUTROABS 4.2  3.5 2.6  HGB 11.1* 11.3* 11.2*  HCT 34.0* 35.7* 36.1  MCV 89.5 91.3 91.2  PLT 286 255 268   Cardiac Enzymes:  Recent Labs Lab 02/10/13 0005 02/10/13 0721  TROPONINI <0.30 <0.30   BNP (last 3 results)  Recent Labs  02/05/13 0544 02/09/13 1439 02/12/13 0517  PROBNP 4211.0* 5031.0* 1839.0*   CBG:  Recent Labs Lab 02/11/13 1701 02/11/13 2137 02/12/13 0641 02/12/13 1116 02/12/13 1145  GLUCAP 164* 146* 194* 131* 132*    No results found for this or any previous visit (from the past 240 hour(s)).   Studies: No results found.  Scheduled Meds: . aspirin  325 mg Oral Daily  . atorvastatin  40 mg Oral Daily  . carvedilol  3.125 mg Oral BID WC  . enoxaparin (LOVENOX) injection  40 mg Subcutaneous Q24H  . fenofibrate  160 mg Oral Daily  . furosemide  40 mg Intravenous BID  . insulin aspart  0-15 Units Subcutaneous TID WC  . isosorbide mononitrate  60 mg Oral Daily  . lisinopril  20 mg Oral Daily  . omega-3 acid ethyl esters  1 g Oral Daily   Continuous Infusions:   Principal Problem:   Systolic and diastolic CHF, acute on chronic Active Problems:   HTN (hypertension)   Dementia   Acute ischemic stroke   Diabetes type 2, uncontrolled   Acute encephalopathy   Hypoglycemia    Time spent: 40 mins    Patrica Mendell M.D. [facility] Pager [phone]. If 7PM-7AM, please contact night-coverage at [email or web address], password [facility] 02/12/2013, 5:00 PM  LOS: 3 days

## 2013-02-12 NOTE — Progress Notes (Signed)
Physical Therapy Treatment Patient Details Name: Paula [name] MRN: [id number] DOB: 1940/08/21 Today's Date: 02/12/2013 Time: 3578-9784 PT Time Calculation (min): 16 min  PT Assessment / Plan / Recommendation  History of Present Illness 73 year old female with a history of combined systolic and diastolic heart failure, hypertension, stroke, nonischemic cardiomyopathy presents with a two-day history of chest pain and shortness of breath. CT of the brain revealed an area of concern for acute infarction in the right cerebellum.    PT Comments   Patient demonstrates progress with mobility today. Still reports "drunken feeling" but appears at her baseline for mobility. No further PT services needed at this time. Will sing off.   Follow Up Recommendations  No PT follow up;Supervision/Assistance - 24 hour     Does the patient have the potential to tolerate intense rehabilitation     Barriers to Discharge        Equipment Recommendations  None recommended by PT    Recommendations for Other Services    Frequency Min 4X/week   Progress towards PT Goals Progress towards PT goals: Progressing toward goals  Plan Current plan remains appropriate    Precautions / Restrictions Precautions Precautions: Fall Restrictions Weight Bearing Restrictions: No   Pertinent Vitals/Pain No pain reported     Mobility  Bed Mobility Overal bed mobility: Needs Assistance Bed Mobility: Sit to Supine;Supine to Sit Supine to sit: Supervision Sit to supine: Supervision Transfers Overall transfer level: Needs assistance Equipment used: None Transfers: Sit to/from Stand Sit to Stand: Supervision Ambulation/Gait Ambulation/Gait assistance: Supervision Ambulation Distance (Feet): 480 Feet Assistive device: None Gait Pattern/deviations: Drifts right/left Gait velocity: improved General Gait Details: steady with ambulation today Stairs: Yes Stairs assistance: Supervision Stair Management: Two  rails;Alternating pattern Number of Stairs: 6 General stair comments: safe with stair negotiation    Exercises     PT Diagnosis:    PT Problem List:   PT Treatment Interventions:     PT Goals (current goals can now be found in the care plan section) Acute Rehab PT Goals Patient Stated Goal: to go home PT Goal Formulation: With patient Time For Goal Achievement: 02/25/13 Potential to Achieve Goals: Good  Visit Information  Last PT Received On: 02/12/13 Assistance Needed: +1 History of Present Illness: 73 year old female with a history of combined systolic and diastolic heart failure, hypertension, stroke, nonischemic cardiomyopathy presents with a two-day history of chest pain and shortness of breath. CT of the brain revealed an area of concern for acute infarction in the right cerebellum.     Subjective Data  Subjective: I really want to go home Patient Stated Goal: to go home   Cognition  Cognition Arousal/Alertness: Awake/alert Behavior During Therapy: WFL for tasks assessed/performed Overall Cognitive Status: History of cognitive impairments - at baseline    Balance   improved  End of Session PT - End of Session Equipment Utilized During Treatment: Gait belt Activity Tolerance: Patient tolerated treatment well;Other (comment) (limited by drunken headed feeling) Patient left: in chair;with call bell/phone within reach Nurse Communication: Mobility status   GP     Fabio Asa 02/12/2013, 10:50 AM Charlotte Crumb, PT DPT  [phone]

## 2013-02-13 DIAGNOSIS — E119 Type 2 diabetes mellitus without complications: Secondary | ICD-10-CM

## 2013-02-13 LAB — GLUCOSE, CAPILLARY
GLUCOSE-CAPILLARY: 211 mg/dL — AB (ref 70–99)
GLUCOSE-CAPILLARY: 122 mg/dL — AB (ref 70–99)
Glucose-Capillary: 123 mg/dL — ABNORMAL HIGH (ref 70–99)
Glucose-Capillary: 178 mg/dL — ABNORMAL HIGH (ref 70–99)

## 2013-02-13 LAB — COMPREHENSIVE METABOLIC PANEL
ALBUMIN: 3.6 g/dL (ref 3.5–5.2)
ALK PHOS: 54 U/L (ref 39–117)
ALT: 47 U/L — ABNORMAL HIGH (ref 0–35)
AST: 17 U/L (ref 0–37)
BILIRUBIN TOTAL: 0.4 mg/dL (ref 0.3–1.2)
BUN: 28 mg/dL — AB (ref 6–23)
CO2: 26 mEq/L (ref 19–32)
CREATININE: 1.3 mg/dL — AB (ref 0.50–1.10)
Calcium: 9.5 mg/dL (ref 8.4–10.5)
Chloride: 98 mEq/L (ref 96–112)
GFR calc Af Amer: 46 mL/min — ABNORMAL LOW (ref >90)
GFR calc non Af Amer: 40 mL/min — ABNORMAL LOW (ref >90)
Glucose, Bld: 131 mg/dL — ABNORMAL HIGH (ref 70–99)
POTASSIUM: 4.4 meq/L (ref 3.7–5.3)
Sodium: 136 mEq/L — ABNORMAL LOW (ref 137–147)
Total Protein: 6.6 g/dL (ref 6.0–8.3)

## 2013-02-13 LAB — CBC WITH DIFFERENTIAL/PLATELET
BASOS ABS: 0 10*3/uL (ref 0.0–0.1)
BASOS PCT: 0 % (ref 0–1)
Eosinophils Absolute: 0.1 10*3/uL (ref 0.0–0.7)
Eosinophils Relative: 2 % (ref 0–5)
HEMATOCRIT: 36.6 % (ref 36.0–46.0)
Hemoglobin: 11.7 g/dL — ABNORMAL LOW (ref 12.0–15.0)
LYMPHS PCT: 40 % (ref 12–46)
Lymphs Abs: 2.2 10*3/uL (ref 0.7–4.0)
MCH: 29.3 pg (ref 26.0–34.0)
MCHC: 32 g/dL (ref 30.0–36.0)
MCV: 91.5 fL (ref 78.0–100.0)
MONO ABS: 0.7 10*3/uL (ref 0.1–1.0)
Monocytes Relative: 13 % — ABNORMAL HIGH (ref 3–12)
NEUTROS ABS: 2.5 10*3/uL (ref 1.7–7.7)
NEUTROS PCT: 45 % (ref 43–77)
PLATELETS: 267 10*3/uL (ref 150–400)
RBC: 4 MIL/uL (ref 3.87–5.11)
RDW: 17.9 % — AB (ref 11.5–15.5)
WBC: 5.6 10*3/uL (ref 4.0–10.5)

## 2013-02-13 LAB — MAGNESIUM: MAGNESIUM: 2.2 mg/dL (ref 1.5–2.5)

## 2013-02-13 MED ORDER — FUROSEMIDE 40 MG PO TABS
40.0000 mg | ORAL_TABLET | Freq: Two times a day (BID) | ORAL | Status: DC
  Administered 2013-02-13 – 2013-02-14 (×3): 40 mg via ORAL
  Filled 2013-02-13 (×5): qty 1

## 2013-02-13 NOTE — Progress Notes (Signed)
Occupational Therapy Note (late entry as note not populated)  Pt with significant improvements with symptoms.  Pt reports dizziness/drunk feeling no longer present and she is at, or close to her baseline.  Dtr agrees.    02/11/13 1500  OT Visit Information  Last OT Received On 02/11/13  Assistance Needed +1  History of Present Illness 73 year old female with a history of combined systolic and diastolic heart failure, hypertension, stroke, nonischemic cardiomyopathy presents with a two-day history of chest pain and shortness of breath. CT of the brain revealed an area of concern for acute infarction in the right cerebellum.   OT Time Calculation  OT Start Time 1424  OT Stop Time 1448  OT Time Calculation (min) 24 min  ADL  Grooming Wash/dry Midwife Method Sit to stand;Stand pivot  Conservation officer, historic buildings height toilet  Toileting - Architect and Hygiene Supervision/safety  Where Assessed - Toileting Clothing Manipulation and Hygiene Sit to stand from 3-in-1 or toilet  Transfers/Ambulation Related to ADLs S  ADL Comments Pt initially asleep.  Awakened pt.  Required increased time to initiate activity.  She reports feeling better, with no s/s dizziness, lightheadedness, etc.  She and dtr both report that pt is back to baseline  Cognition  Arousal/Alertness Awake/alert  Behavior During Therapy [facility] for tasks assessed/performed  Overall Cognitive Status History of cognitive impairments - at baseline  Bed Mobility  Overal bed mobility Modified Independent  Transfers  Overall transfer level Needs assistance  Equipment used None  Transfers Sit to/from Stand  Sit to Stand Supervision  OT - End of Session  Activity Tolerance Patient tolerated treatment well  Patient left in chair;with call bell/phone within reach;with family/visitor present  Nurse Communication Mobility status  OT Assessment/Plan  OT  Plan Discharge plan remains appropriate  OT Frequency Min 2X/week  Follow Up Recommendations Home health OT;Supervision/Assistance - 24 hour  OT Equipment None recommended by OT  OT Goal Progression  Progress towards OT goals Progressing toward goals  ADL Goals  Pt Will Perform Grooming with modified independence;standing  Pt Will Perform Upper Body Bathing with supervision  Pt Will Perform Lower Body Bathing with supervision;sit to/from stand  Pt Will Transfer to Toilet with modified independence;ambulating  Pt Will Perform Toileting - Clothing Manipulation and hygiene with modified independence;sit to/from stand  Additional ADL Goal #1 Pt will independently utilize 3/3 energy conservation techniques.  OT General Charges  $OT Visit 1 Procedure  OT Treatments  $Self Care/Home Management  8-22 mins  $Therapeutic Activity 8-22 mins   [employer], OTR/L [phone]

## 2013-02-13 NOTE — Progress Notes (Signed)
[facility] PROGRESS NOTE  Paula [name] EUM:[id number] DOB: 1940/12/28 DOA: 02/09/2013 PCP: [name], MD  Assessment/Plan: #1 acute encephalopathy Likely multifactorial secondary to medical issues such as acute CHF exacerbation. Patient with resolution of symptoms in the setting of negative imaging. CT angiogram of the head and neck was negative for any acute abnormalities. Patient was on aspirin prior to admission was subsequently changed to Plavix. Patient to change back to aspirin per neurology recommendations. Clinical improvement. Continue diuresis with Lasix today. Follow.  #2 acute on chronic systolic heart failure Patient presented with shortness of breath on exertion.pro BNP was elevated at 5031. Patient's current weight is 80.922kg from 82.78 kg. Doubt if patient is being weighed accurately. I/O = 10. Doubt if accurate. Strict I.'s and O. Daily weights. Cardiac enzymes are negative x2. LDL is a 59. 2-D echo with EF of 25% with diffuse hypokinesis which is unchanged from prior 2-D echo.. Change IV Lasix to oral lasix and continue aspirin, Coreg, Lipitor, Imdur, lisinopril. Will likely need outpatient followup.  #3 hyperlipidemia Continue Lipitor.  #4 atypical chest pain Resolved. Cardiac enzymes negative x2. EKG is unchanged with a left bundle branch block. 2-D echo with EF of 25% with diffuse hypokinesis which is unchanged from prior 2-D echo. Cardiology was consulted secondary to patient reporting ICD fired several times prior to admission and patient to be seen by cardiology today.  #5 diabetes mellitus Patient noted to be hypoglycemic and a such Levemir was discontinued. Hemoglobin A1c is 8.5. CBGs have ranged from 122-211. Continue sliding scale insulin.  #6. Hypertension Stable. Continue lisinopril, Coreg, Imdur.  #7 prophylaxis Lovenox for DVT prophylaxis.  Code Status: full Family Communication: updated patient no family at bedside. Disposition Plan:  home when medically stable.   Consultants:  Neurology: Dr.Stewart 02/09/2013  Procedures:  CT angiogram head and neck 02/10/2013  CT of the head 2/9/ 2015  Chest x-ray 02/09/2013  Carotid Dopplers 02/10/2013  2-D echo 02/10/2013  Antibiotics:  none  HPI/Subjective: Patient states she's feeling much better. Patient states breathing is better. Patient alert and oriented x3.  Objective: Filed Vitals:   02/13/13 1320  BP: 111/67  Pulse: 63  Temp: 97.5 F (36.4 C)  Resp: 20    Intake/Output Summary (Last 24 hours) at 02/13/13 1402 Last data filed at 02/13/13 1322  Gross per 24 hour  Intake    720 ml  Output    900 ml  Net   -180 ml   Filed Weights   02/09/13 1843 02/10/13 0546 02/12/13 0955  Weight: 82.9 kg (182 lb 12.2 oz) 82.781 kg (182 lb 8 oz) 80.922 kg (178 lb 6.4 oz)    Exam:   General:  NAD  Cardiovascular: RRR  Respiratory: DECREASED BREATH SOUNDS IN THE BASES.  Abdomen: soft, nontender, nondistended, positive bowel sounds.  Musculoskeletal: no clubbing cyanosis. Trace bilateral lower extremity edema  Data Reviewed: Basic Metabolic Panel:  Recent Labs Lab 02/09/13 1459 02/10/13 1815 02/11/13 0430 02/12/13 0517 02/13/13 0600  NA 139 133* 137 136* 136*  K 4.7 4.6 5.3 4.8 4.4  CL 104 100 103 99 98  CO2 20 19 20 24 26   GLUCOSE 65* 219* 164* 168* 131*  BUN 25* 32* 29* 29* 28*  CREATININE 1.10 1.50* 1.34* 1.29* 1.30*  CALCIUM 9.6 9.2 9.4 9.2 9.5  MG  --  2.1 2.2 2.1 2.2   Liver Function Tests:  Recent Labs Lab 02/10/13 1815 02/11/13 0430 02/12/13 0517 02/13/13 0600  AST 36 32 17  17  ALT 83* 80* 58* 47*  ALKPHOS 62 66 58 54  BILITOT 0.4 0.5 0.3 0.4  PROT 6.0 6.6 6.6 6.6  ALBUMIN 3.2* 3.6 3.6 3.6   No results found for this basename: LIPASE, AMYLASE,  in the last 168 hours No results found for this basename: AMMONIA,  in the last 168 hours CBC:  Recent Labs Lab 02/09/13 1459 02/11/13 0430 02/12/13 0517 02/13/13 0600   WBC 8.6 6.4 5.4 5.6  NEUTROABS 4.2 3.5 2.6 2.5  HGB 11.1* 11.3* 11.2* 11.7*  HCT 34.0* 35.7* 36.1 36.6  MCV 89.5 91.3 91.2 91.5  PLT 286 255 268 267   Cardiac Enzymes:  Recent Labs Lab 02/10/13 0005 02/10/13 0721  TROPONINI <0.30 <0.30   BNP (last 3 results)  Recent Labs  02/05/13 0544 02/09/13 1439 02/12/13 0517  PROBNP 4211.0* 5031.0* 1839.0*   CBG:  Recent Labs Lab 02/12/13 1145 02/12/13 1709 02/12/13 2045 02/13/13 0826 02/13/13 1139  GLUCAP 132* 140* 126* 211* 123*    No results found for this or any previous visit (from the past 240 hour(s)).   Studies: No results found.  Scheduled Meds: . aspirin  325 mg Oral Daily  . atorvastatin  40 mg Oral Daily  . carvedilol  3.125 mg Oral BID WC  . enoxaparin (LOVENOX) injection  40 mg Subcutaneous Q24H  . fenofibrate  160 mg Oral Daily  . furosemide  40 mg Oral BID  . insulin aspart  0-15 Units Subcutaneous TID WC  . isosorbide mononitrate  60 mg Oral Daily  . lisinopril  20 mg Oral Daily  . omega-3 acid ethyl esters  1 g Oral Daily   Continuous Infusions:   Principal Problem:   Systolic and diastolic CHF, acute on chronic Active Problems:   HTN (hypertension)   Dementia   Acute ischemic stroke   Diabetes type 2, uncontrolled   Acute encephalopathy   Hypoglycemia    Time spent: 40 mins    Laneisha Mino M.D. [facility] Pager [phone]. If 7PM-7AM, please contact night-coverage at [email or web address], password [email or web address] 02/13/2013, 2:02 PM  LOS: 4 days

## 2013-02-14 LAB — GLUCOSE, CAPILLARY
Glucose-Capillary: 147 mg/dL — ABNORMAL HIGH (ref 70–99)
Glucose-Capillary: 134 mg/dL — ABNORMAL HIGH (ref 70–99)

## 2013-02-14 LAB — BASIC METABOLIC PANEL
BUN: 24 mg/dL — ABNORMAL HIGH (ref 6–23)
CO2: 23 mEq/L (ref 19–32)
Calcium: 9.8 mg/dL (ref 8.4–10.5)
Chloride: 100 mEq/L (ref 96–112)
Creatinine, Ser: 1.15 mg/dL — ABNORMAL HIGH (ref 0.50–1.10)
GFR, EST AFRICAN AMERICAN: 53 mL/min — AB (ref >90)
GFR, EST NON AFRICAN AMERICAN: 46 mL/min — AB (ref >90)
Glucose, Bld: 136 mg/dL — ABNORMAL HIGH (ref 70–99)
POTASSIUM: 4.3 meq/L (ref 3.7–5.3)
Sodium: 137 mEq/L (ref 137–147)

## 2013-02-14 LAB — MAGNESIUM: Magnesium: 2.3 mg/dL (ref 1.5–2.5)

## 2013-02-14 MED ORDER — NITROGLYCERIN 0.4 MG SL SUBL
0.4000 mg | SUBLINGUAL_TABLET | SUBLINGUAL | Status: DC | PRN
Start: 2013-02-14 — End: 2014-08-03

## 2013-02-14 MED ORDER — INSULIN ASPART 100 UNIT/ML ~~LOC~~ SOLN: 3.0000 [IU] | Freq: Three times a day (TID) | SUBCUTANEOUS | Status: DC

## 2013-02-14 MED ORDER — FUROSEMIDE 40 MG PO TABS: 40.0000 mg | ORAL_TABLET | Freq: Two times a day (BID) | ORAL | Status: DC

## 2013-02-14 NOTE — Progress Notes (Signed)
Discharge orders received. VSS, medications and follow up appointments reviewed with patient and caregiver. They have verbalized understanding of these instructions. Prescriptions given to caregiver. Discharged home via wheelchair with daughter.

## 2013-02-14 NOTE — Discharge Summary (Signed)
Physician Discharge Summary  Paula [name] A File [name]:[id number] DOB: [birth year] DOA: 02/09/2013  PCP: [name],[name], MD  Admit date: 02/09/2013 Discharge date: 02/14/2013  Time spent: 65 minutes  Recommendations for Outpatient Follow-up:  1. Followup with PCP, [name],[name], MD in 1 week. On followup patient's diabetes and needs to be reassessed as  Patient's Levemir was discontinued secondary to hypoglycemic episodes during the hospitalization. Patient was discharged home on a decreased dose of a meal coverage insulin. Patient's acute on chronic CHF also need to be reassessed. Patient's Lasix dose was increased to 40 mg twice daily. Patient will need a basic metabolic profile done on followup to followup on electrolytes and renal function. 2. Patient is to followup with her cardiologist 1- 2 weeks post discharge.  Discharge Diagnoses:  Principal Problem:   Systolic and diastolic CHF, acute on chronic Active Problems:   HTN (hypertension)   Dementia   Acute ischemic stroke   Diabetes type 2, uncontrolled   Acute encephalopathy   Hypoglycemia   Discharge Condition: Stable and improved  Diet recommendation: Heart healthy  Filed Weights   02/10/13 0546 02/12/13 0955 02/14/13 0500  Weight: 82.781 kg (182 lb 8 oz) 80.922 kg (178 lb 6.4 oz) 82.6 kg (182 lb 1.6 oz)    History of present illness:  73 year old female with a history of combined systolic and diastolic heart failure, hypertension, stroke, nonischemic cardiomyopathy presents with a two-day history of chest pain and shortness of breath. The patient is a poor historian. She is unable to clarify in any detail the quality, duration, or circumstances surrounding her chest pain. She was able to tell me that her chest pain has been occurring at rest. The patient's daughter is at the bedside, but unfortunately she is unable to provide any significant history to assist. The patient is unable to provide any clearity regarding the  circumstances surrounding her shortness of breath other than the fact that her breathing has improved since arrival to the emergency department. Her daughter was able to tell me that the patient is a little more confused than usual, but she has not seen the patient for over one month. The patient has not able to tell me if she has been taking her medications, but the patient's daughter states that her home health aide helps g the patient's medications to take on a daily basis. The patient was recently discharged from the hospital on 02/07/2013 after a short stay for acute on chronic CHF. Currently, the patient is chest pain-free and denies any shortness of breath, nausea, vomiting, dizziness, abdominal pain, dysuria, hematuria.  In the emergency department, workup including CT of the brain revealed an area of concern for acute infarction in the right cerebellum. Chest x-ray showed interstitial prominence with bibasilar atelectasis. The patient was afebrile and hemodynamically stable. She was given 4 baby aspirin in the ED. BMP showed serum creatinine 1.10. Serum glucose was 65. ProBNP was 5031. A stat troponin was 0.07.   Hospital Course:  #1 acute encephalopathy  Patient was admitted with concern for acute encephalopathy.  Likely multifactorial secondary to medical issues such as acute CHF exacerbation. CT of the head which was done revealed an area of concern for acute infarct in the mid right cerebellum. Patient was subsequently admitted for further stroke workup and a neurology consultation was obtained. Patient did have a pacemaker and a such MRI of the head could not be obtained.  Patient with resolution of symptoms in the setting of negative imaging. CT angiogram of  the head and neck was negative for any acute abnormalities. Patient was on aspirin prior to admission was subsequently changed to Plavix. Patient was change back to aspirin per neurology recommendations. Patient during the hospitalization  was felt to be in acute on chronic CHF exacerbation and was diuresis with IV Lasix with clinical improvement. Patient was felt to be back at baseline by day of discharge.  #2 acute on chronic systolic heart failure  Patient presented with shortness of breath on exertion.pro BNP was elevated at 5031. Patient was admitted to telemetry. Cardiac enzymes which was cycled were negative x2. LDL obtained was 59. 2-D echo with EF of 25% with diffuse hypokinesis which is unchanged from prior 2-D echo. Patient was diuresis with IV Lasix with good diuresis. Patient was maintained on her home regimen of aspirin, Coreg, Lipitor, Imdur, lisinopril. Patient was subsequently transitioned to oral Lasix which she tolerated. Patient improved clinically and will be discharged home in stable and improved condition. W #3 hyperlipidemia  Continued on Lipitor. LDL obtained was 59. #4 atypical chest pain  Resolved. Cardiac enzymes negative x2. EKG was unchanged with a left bundle branch block. 2-D echo with EF of 25% with diffuse hypokinesis which is unchanged from prior 2-D echo. Cardiology was consulted secondary to patient reporting ICD fired several times prior to admission. Patient's pacemaker was interrogated. Patient's chest pain resolved and patient did not have any further chest pain. Patient will followup with her cardiologist as outpatient.  #5 diabetes mellitus  Patient noted to be hypoglycemic and a such Levemir was discontinued. Hemoglobin A1c is 8.5. CBGs have ranged from 122-211. Patient was maintained on a sliding scale insulin. Patient will be discharged home off of her Levemir and on a reduced dose of a meal coverage insulin at 3 units 3 times daily. Patient will followup with PCP as outpatient. #6. Hypertension  Stable. Continued on lisinopril, Coreg, Imdur.      Procedures: CT angiogram head and neck 02/10/2013  CT of the head 2/9/ 2015  Chest x-ray 02/09/2013  Carotid Dopplers 02/10/2013  2-D echo  02/10/2013     Consultations: Neurology: Dr.Stewart 02/09/2013   Discharge Exam: Filed Vitals:   02/14/13 1338  BP: 110/59  Pulse: 66  Temp: 98 F (36.7 C)  Resp: 20    General: NAD Cardiovascular: RRR Respiratory: CTAB  Discharge Instructions      Discharge Orders   Future Orders Complete By Expires   Diet - low sodium heart healthy  As directed    Discharge instructions  As directed    Comments:     Follow up with [name], MD in 1 week. Follow up with cardiologist in 1-2 weeks.   Increase activity slowly  As directed        Medication List    STOP taking these medications       LEVEMIR FLEXPEN 100 UNIT/ML Pen  Generic drug:  Insulin Detemir      TAKE these medications       alendronate 70 MG tablet  Commonly known as:  FOSAMAX  Take 70 mg by mouth once a week. Take with a full glass of water on an empty stomach. On wednesdays     aspirin 325 MG tablet  Take 325 mg by mouth daily.     atorvastatin 40 MG tablet  Commonly known as:  LIPITOR  Take 40 mg by mouth daily.     carvedilol 3.125 MG tablet  Commonly known as:  COREG  Take 1  tablet (3.125 mg total) by mouth 2 (two) times daily with a meal.     fenofibrate 160 MG tablet  Take 160 mg by mouth daily.     Fish Oil 1200 MG Caps  Take 1 capsule by mouth daily.     furosemide 40 MG tablet  Commonly known as:  LASIX  Take 1 tablet (40 mg total) by mouth 2 (two) times daily.     insulin aspart 100 UNIT/ML injection  Commonly known as:  NOVOLOG  Inject 3 Units into the skin 3 (three) times daily before meals.     isosorbide mononitrate 60 MG 24 hr tablet  Commonly known as:  IMDUR  Take 60 mg by mouth daily.     lisinopril 40 MG tablet  Commonly known as:  PRINIVIL,ZESTRIL  Take 0.5 tablets (20 mg total) by mouth daily.     loratadine-pseudoephedrine 5-120 MG per tablet  Commonly known as:  CLARITIN-D 12-hour  Take 1 tablet by mouth 2 (two) times daily as needed for  allergies.     nitroGLYCERIN 0.4 MG SL tablet  Commonly known as:  NITROSTAT  Place 1 tablet (0.4 mg total) under the tongue every 5 (five) minutes as needed for chest pain.     pantoprazole 40 MG tablet  Commonly known as:  PROTONIX  Take 40 mg by mouth daily.     potassium chloride SA 20 MEQ tablet  Commonly known as:  K-DUR,KLOR-CON  Take 20 mEq by mouth daily.     traMADol 50 MG tablet  Commonly known as:  ULTRAM  Take 1 tablet (50 mg total) by mouth every 6 (six) hours as needed. For pain       No Known Allergies Follow-up Information   Follow up with [name], MD. Schedule an appointment as soon as possible for a visit in 1 week.   Specialty:  Emergency Medicine   Contact information:   [address] [place] [address] [phone]       Please follow up. (f/u with your cardiologist in 1-2 weeks.)        The results of significant diagnostics from this hospitalization (including imaging, microbiology, ancillary and laboratory) are listed below for reference.    Significant Diagnostic Studies: Ct Angio Head W/cm &/or Wo Cm  02/10/2013   CLINICAL DATA:  Vertigo and slurred speech. Concern for acute stroke.  EXAM: CT ANGIOGRAPHY HEAD AND NECK  TECHNIQUE: Multidetector CT imaging of the head and neck was performed using the standard protocol during bolus administration of intravenous contrast. Multiplanar CT image reconstructions and MIPs were obtained to evaluate the vascular anatomy. Carotid stenosis measurements (when applicable) are obtained utilizing NASCET criteria, using the distal internal carotid diameter as the denominator.  CONTRAST:  50mL OMNIPAQUE IOHEXOL 350 MG/ML SOLN  COMPARISON:  Head CT 02/09/2013.  Head MRI and MRA 11/17/2002.  FINDINGS: CTA HEAD FINDINGS  The distal right vertebral artery is hypoplastic and ends in PICA. Distal left vertebral artery is patent and contains a fenestration in its proximal V4 segment. Left PICA origin is  patent. Basilar artery is mildly small in caliber diffusely but patent and without evidence of significant focal stenosis. SCA origins are patent bilaterally. There are hypoplastic P1 segments bilaterally with prominent bilateral posterior communicating arteries. There is very mild irregularity of the right P2 segment. There is mild irregularity and mild to moderate narrowing of the proximal to mid left P2 segment. The internal carotid arteries are patent from skullbase to carotid  termini. ACA and MCA origins are patent. Right A1 segment appears mildly hypoplastic. There is very mild irregularity of ACA and MCA branches bilaterally without evidence of significant proximal stenosis. No intracranial aneurysm is identified.  Hypoattenuation in the inferior right cerebellum is unchanged from recent noncontrast head CT. Remote right basal ganglia lacunar infarct is unchanged. Cerebral atrophy is unchanged. Confluent periventricular white matter hypodensities with greater involvement of the left cerebellar hemisphere compared to the right are unchanged and compatible with moderate chronic small vessel ischemic disease. There is no evidence of mass, midline shift, intracranial hemorrhage, or extra-axial fluid collection. No abnormal parenchymal or meningeal enhancement is identified after the IV administration of contrast material. Orbits are unremarkable. Visualized mastoid air cells and paranasal sinuses are clear.  Review of the MIP images confirms the above findings.  CTA NECK FINDINGS  Incidental note is made of a common origin of the innominate and left common carotid arteries. Mild aortic arch calcification is noted including involving the proximal left subclavian artery. Evaluation of the proximal arch vessels is mildly limited by streak artifact from patient's pacemaker generator and leads. The common carotid arteries are patent without stenosis. There is minimal atherosclerotic calcification at the left carotid  bifurcation without stenosis. Cervical internal carotid arteries are patent and unremarkable.  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 The proximal aspect of the right vertebral artery is difficult to fully evaluate due to its small caliber and artifact in this region, however the remainder of the right vertebral artery appears patent. The left vertebral artery is patent and dominant without significant stenosis identified.  The soft tissues of the neck are grossly unremarkable. Mild to moderate disc space narrowing and endplate spurring are present in the mid and lower cervical spine.  Review of the MIP images confirms the above findings.  IMPRESSION: 1. No evidence of cervical carotid stenosis. 2. 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 Dominant left vertebral artery without stenosis. 3. Mild intracranial arterial irregularity suggestive of atherosclerosis with mild to moderate narrowing of the left P2 segment. 4. Unchanged appearance of the brain.   Electronically Signed   By: Sebastian Ache   On: 02/10/2013 16:28   Dg Chest 2 View  02/09/2013   CLINICAL DATA:  Shortness of breath and weakness  EXAM: CHEST  2 VIEW  COMPARISON:  02/05/2013  FINDINGS: Cardiac shadow remains enlarged. A defibrillator is again seen. Lungs are well aerated with a mild bibasilar atelectatic changes. The overall appearance is stable from the previous exam. No focal confluent infiltrate is seen. Marland Kitchen  IMPRESSION: Stable bibasilar atelectatic changes.   Electronically Signed   By: Alcide Clever M.D.   On: 02/09/2013 13:00   Dg Chest 2 View  02/05/2013   CLINICAL DATA:  Chest pain A air  EXAM: CHEST  2 VIEW  COMPARISON:  Prior radiograph 12/24/2012  FINDINGS: Left-sided pacemaker again noted.  Cardiomegaly is stable.  Lungs are mildly hypoinflated. There is diffuse pulmonary vascular congestion with scattered Kerley B-lines, compatible with mild pulmonary edema. Small  bilateral pleural effusions are suspected. No definite focal infiltrates. No pneumothorax.  Osseous structures are unchanged.  IMPRESSION: Cardiomegaly with mild diffuse pulmonary edema. Small bilateral pleural effusions are suspected.   Electronically Signed   By: Rise Mu M.D.   On: 02/05/2013 06:20   Ct Head Wo Contrast  02/09/2013   CLINICAL DATA:  Altered mental status  EXAM: CT HEAD WITHOUT CONTRAST  TECHNIQUE: Contiguous  axial images were obtained from the base of the skull through the vertex without intravenous contrast. Study was obtained within 24 hr of patient's arrival at the emergency department.  COMPARISON:  July 11, 2012  FINDINGS: There is moderate diffuse atrophy. There is no demonstrable mass, hemorrhage, extra-axial fluid collection, or midline shift. There is extensive small vessel disease throughout the centra semiovale bilaterally. There is evidence of a prior small infarct in the globus pallidum on the right, a stable finding.  There is subtle decreased attenuation in the mid right cerebellum, and area concerning for recent infarct. No other findings suspicious for acute infarct.  The bony calvarium appears intact. The mastoid air cells are clear. There is rightward deviation of the nasal septum.  IMPRESSION: Area concerning for acute infarct in the mid right cerebellum in the region of the right dentate nucleus. No other evidence suspicious for potential acute infarct. No hemorrhage or mass effect. There is atrophy with extensive supratentorial small vessel disease. There is a prior small lacunar infarct in the right globus pallidum, stable.   Electronically Signed   By: Bretta Bang M.D.   On: 02/09/2013 13:29   Ct Angio Neck W/cm &/or Wo/cm  02/10/2013   CLINICAL DATA:  Vertigo and slurred speech. Concern for acute stroke.  EXAM: CT ANGIOGRAPHY HEAD AND NECK  TECHNIQUE: Multidetector CT imaging of the head and neck was performed using the standard protocol during  bolus administration of intravenous contrast. Multiplanar CT image reconstructions and MIPs were obtained to evaluate the vascular anatomy. Carotid stenosis measurements (when applicable) are obtained utilizing NASCET criteria, using the distal internal carotid diameter as the denominator.  CONTRAST:  50mL OMNIPAQUE IOHEXOL 350 MG/ML SOLN  COMPARISON:  Head CT 02/09/2013.  Head MRI and MRA 11/17/2002.  FINDINGS: CTA HEAD FINDINGS  The distal right vertebral artery is hypoplastic and ends in PICA. Distal left vertebral artery is patent and contains a fenestration in its proximal V4 segment. Left PICA origin is patent. Basilar artery is mildly small in caliber diffusely but patent and without evidence of significant focal stenosis. SCA origins are patent bilaterally. There are hypoplastic P1 segments bilaterally with prominent bilateral posterior communicating arteries. There is very mild irregularity of the right P2 segment. There is mild irregularity and mild to moderate narrowing of the proximal to mid left P2 segment. The internal carotid arteries are patent from skullbase to carotid termini. ACA and MCA origins are patent. Right A1 segment appears mildly hypoplastic. There is very mild irregularity of ACA and MCA branches bilaterally without evidence of significant proximal stenosis. No intracranial aneurysm is identified.  Hypoattenuation in the inferior right cerebellum is unchanged from recent noncontrast head CT. Remote right basal ganglia lacunar infarct is unchanged. Cerebral atrophy is unchanged. Confluent periventricular white matter hypodensities with greater involvement of the left cerebellar hemisphere compared to the right are unchanged and compatible with moderate chronic small vessel ischemic disease. There is no evidence of mass, midline shift, intracranial hemorrhage, or extra-axial fluid collection. No abnormal parenchymal or meningeal enhancement is identified after the IV administration of  contrast material. Orbits are unremarkable. Visualized mastoid air cells and paranasal sinuses are clear.  Review of the MIP images confirms the above findings.  CTA NECK FINDINGS  Incidental note is made of a common origin of the innominate and left common carotid arteries. Mild aortic arch calcification is noted including involving the proximal left subclavian artery. Evaluation of the proximal arch vessels is mildly limited by streak artifact from  patient's pacemaker generator and leads. The common carotid arteries are patent without stenosis. There is minimal atherosclerotic calcification at the left carotid bifurcation without stenosis. Cervical internal carotid arteries are patent and unremarkable.  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 The proximal aspect of the right vertebral artery is difficult to fully evaluate due to its small caliber and artifact in this region, however the remainder of the right vertebral artery appears patent. The left vertebral artery is patent and dominant without significant stenosis identified.  The soft tissues of the neck are grossly unremarkable. Mild to moderate disc space narrowing and endplate spurring are present in the mid and lower cervical spine.  Review of the MIP images confirms the above findings.  IMPRESSION: 1. No evidence of cervical carotid stenosis. 2. 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 Dominant left vertebral artery without stenosis. 3. Mild intracranial arterial irregularity suggestive of atherosclerosis with mild to moderate narrowing of the left P2 segment. 4. Unchanged appearance of the brain.   Electronically Signed   By: Sebastian Ache   On: 02/10/2013 16:28    Microbiology: No results found for this or any previous visit (from the past 240 hour(s)).   Labs: Basic Metabolic Panel:  Recent Labs Lab 02/10/13 1815 02/11/13 0430 02/12/13 0517 02/13/13 0600  02/14/13 0550  NA 133* 137 136* 136* 137  K 4.6 5.3 4.8 4.4 4.3  CL 100 103 99 98 100  CO2 19 20 24 26 23   GLUCOSE 219* 164* 168* 131* 136*  BUN 32* 29* 29* 28* 24*  CREATININE 1.50* 1.34* 1.29* 1.30* 1.15*  CALCIUM 9.2 9.4 9.2 9.5 9.8  MG 2.1 2.2 2.1 2.2 2.3   Liver Function Tests:  Recent Labs Lab 02/10/13 1815 02/11/13 0430 02/12/13 0517 02/13/13 0600  AST 36 32 17 17  ALT 83* 80* 58* 47*  ALKPHOS 62 66 58 54  BILITOT 0.4 0.5 0.3 0.4  PROT 6.0 6.6 6.6 6.6  ALBUMIN 3.2* 3.6 3.6 3.6   No results found for this basename: LIPASE, AMYLASE,  in the last 168 hours No results found for this basename: AMMONIA,  in the last 168 hours CBC:  Recent Labs Lab 02/09/13 1459 02/11/13 0430 02/12/13 0517 02/13/13 0600  WBC 8.6 6.4 5.4 5.6  NEUTROABS 4.2 3.5 2.6 2.5  HGB 11.1* 11.3* 11.2* 11.7*  HCT 34.0* 35.7* 36.1 36.6  MCV 89.5 91.3 91.2 91.5  PLT 286 255 268 267   Cardiac Enzymes:  Recent Labs Lab 02/10/13 0005 02/10/13 0721  TROPONINI <0.30 <0.30   BNP: BNP (last 3 results)  Recent Labs  02/05/13 0544 02/09/13 1439 02/12/13 0517  PROBNP 4211.0* 5031.0* 1839.0*   CBG:  Recent Labs Lab 02/13/13 1139 02/13/13 1631 02/13/13 2200 02/14/13 0655 02/14/13 1145  GLUCAP 123* 122* 178* 147* 134*       Signed:  Waymond Meador MD [facility] 02/14/2013, 3:08 PM

## 2013-03-01 ENCOUNTER — Other Ambulatory Visit (HOSPITAL_COMMUNITY): Payer: Self-pay | Admitting: Internal Medicine

## 2013-03-23 ENCOUNTER — Other Ambulatory Visit (HOSPITAL_COMMUNITY): Payer: Self-pay | Admitting: Internal Medicine

## 2013-03-23 ENCOUNTER — Other Ambulatory Visit: Payer: Self-pay | Admitting: Nurse Practitioner

## 2013-03-24 ENCOUNTER — Emergency Department (HOSPITAL_COMMUNITY)
Admission: EM | Admit: 2013-03-24 | Discharge: 2013-03-24 | Disposition: A | Discharge status: Home / Self Care | Payer: Medicare Other | Source: Home / Self Care | Attending: Emergency Medicine | Admitting: Emergency Medicine

## 2013-03-24 ENCOUNTER — Encounter (HOSPITAL_COMMUNITY): Payer: Self-pay | Admitting: Emergency Medicine

## 2013-03-24 ENCOUNTER — Emergency Department (HOSPITAL_COMMUNITY): Payer: Medicare Other

## 2013-03-24 DIAGNOSIS — Z8673 Personal history of transient ischemic attack (TIA), and cerebral infarction without residual deficits: Secondary | ICD-10-CM | POA: Insufficient documentation

## 2013-03-24 DIAGNOSIS — Z7982 Long term (current) use of aspirin: Secondary | ICD-10-CM | POA: Insufficient documentation

## 2013-03-24 DIAGNOSIS — R5381 Other malaise: Secondary | ICD-10-CM

## 2013-03-24 DIAGNOSIS — F0390 Unspecified dementia without behavioral disturbance: Secondary | ICD-10-CM | POA: Insufficient documentation

## 2013-03-24 DIAGNOSIS — J040 Acute laryngitis: Secondary | ICD-10-CM

## 2013-03-24 DIAGNOSIS — I10 Essential (primary) hypertension: Secondary | ICD-10-CM | POA: Insufficient documentation

## 2013-03-24 DIAGNOSIS — F17200 Nicotine dependence, unspecified, uncomplicated: Secondary | ICD-10-CM

## 2013-03-24 DIAGNOSIS — Z950 Presence of cardiac pacemaker: Secondary | ICD-10-CM | POA: Insufficient documentation

## 2013-03-24 DIAGNOSIS — I5020 Unspecified systolic (congestive) heart failure: Secondary | ICD-10-CM | POA: Insufficient documentation

## 2013-03-24 DIAGNOSIS — E119 Type 2 diabetes mellitus without complications: Secondary | ICD-10-CM | POA: Insufficient documentation

## 2013-03-24 DIAGNOSIS — Z79899 Other long term (current) drug therapy: Secondary | ICD-10-CM

## 2013-03-24 DIAGNOSIS — Z8739 Personal history of other diseases of the musculoskeletal system and connective tissue: Secondary | ICD-10-CM

## 2013-03-24 DIAGNOSIS — Z794 Long term (current) use of insulin: Secondary | ICD-10-CM | POA: Insufficient documentation

## 2013-03-24 DIAGNOSIS — R5383 Other fatigue: Secondary | ICD-10-CM | POA: Insufficient documentation

## 2013-03-24 DIAGNOSIS — I5023 Acute on chronic systolic (congestive) heart failure: Secondary | ICD-10-CM | POA: Diagnosis not present

## 2013-03-24 DIAGNOSIS — R0602 Shortness of breath: Secondary | ICD-10-CM | POA: Diagnosis not present

## 2013-03-24 LAB — URINALYSIS, ROUTINE W REFLEX MICROSCOPIC
BILIRUBIN URINE: NEGATIVE
Glucose, UA: NEGATIVE mg/dL
HGB URINE DIPSTICK: NEGATIVE
Ketones, ur: NEGATIVE mg/dL
Leukocytes, UA: NEGATIVE
Nitrite: NEGATIVE
PH: 5 (ref 5.0–8.0)
Protein, ur: >300 mg/dL — AB
Specific Gravity, Urine: 1.03 (ref 1.005–1.030)
Urobilinogen, UA: 0.2 mg/dL (ref 0.0–1.0)

## 2013-03-24 LAB — URINE MICROSCOPIC-ADD ON

## 2013-03-24 LAB — CBC WITH DIFFERENTIAL/PLATELET
BASOS PCT: 0 % (ref 0–1)
Basophils Absolute: 0 10*3/uL (ref 0.0–0.1)
Eosinophils Absolute: 0.1 10*3/uL (ref 0.0–0.7)
Eosinophils Relative: 2 % (ref 0–5)
HEMATOCRIT: 38.3 % (ref 36.0–46.0)
Hemoglobin: 12.4 g/dL (ref 12.0–15.0)
LYMPHS ABS: 1.4 10*3/uL (ref 0.7–4.0)
Lymphocytes Relative: 24 % (ref 12–46)
MCH: 28.8 pg (ref 26.0–34.0)
MCHC: 32.4 g/dL (ref 30.0–36.0)
MCV: 89.1 fL (ref 78.0–100.0)
MONO ABS: 0.7 10*3/uL (ref 0.1–1.0)
Monocytes Relative: 11 % (ref 3–12)
NEUTROS ABS: 3.7 10*3/uL (ref 1.7–7.7)
NEUTROS PCT: 63 % (ref 43–77)
Platelets: 223 10*3/uL (ref 150–400)
RBC: 4.3 MIL/uL (ref 3.87–5.11)
RDW: 15.9 % — ABNORMAL HIGH (ref 11.5–15.5)
WBC: 5.9 10*3/uL (ref 4.0–10.5)

## 2013-03-24 LAB — BASIC METABOLIC PANEL
BUN: 22 mg/dL (ref 6–23)
CHLORIDE: 102 meq/L (ref 96–112)
CO2: 20 mEq/L (ref 19–32)
CREATININE: 1.06 mg/dL (ref 0.50–1.10)
Calcium: 10.2 mg/dL (ref 8.4–10.5)
GFR calc Af Amer: 59 mL/min — ABNORMAL LOW (ref >90)
GFR calc non Af Amer: 51 mL/min — ABNORMAL LOW (ref >90)
GLUCOSE: 208 mg/dL — AB (ref 70–99)
POTASSIUM: 3.9 meq/L (ref 3.7–5.3)
Sodium: 137 mEq/L (ref 137–147)

## 2013-03-24 LAB — TROPONIN I: Troponin I: <0.3 ng/mL (ref <0.30)

## 2013-03-24 MED ORDER — CARVEDILOL 6.25 MG PO TABS
6.2500 mg | ORAL_TABLET | Freq: Two times a day (BID) | ORAL | Status: DC
  Administered 2013-03-24: 6.25 mg via ORAL
  Filled 2013-03-24 (×2): qty 1

## 2013-03-24 MED ORDER — HYDROCOD POLST-CHLORPHEN POLST 10-8 MG/5ML PO LQCR: 5.0000 mL | Freq: Two times a day (BID) | ORAL | Status: DC

## 2013-03-24 MED ORDER — KETOROLAC TROMETHAMINE 60 MG/2ML IM SOLN
60.0000 mg | Freq: Once | INTRAMUSCULAR | Status: AC
  Administered 2013-03-24: 60 mg via INTRAMUSCULAR
  Filled 2013-03-24 (×2): qty 2

## 2013-03-24 MED ORDER — BENZONATATE 100 MG PO CAPS: 100.0000 mg | ORAL_CAPSULE | Freq: Three times a day (TID) | ORAL | Status: DC

## 2013-03-24 MED ORDER — AZITHROMYCIN 250 MG PO TABS: 250.0000 mg | ORAL_TABLET | Freq: Every day | ORAL | Status: DC

## 2013-03-24 MED ORDER — BENZONATATE 100 MG PO CAPS
200.0000 mg | ORAL_CAPSULE | Freq: Once | ORAL | Status: AC
  Administered 2013-03-24: 200 mg via ORAL
  Filled 2013-03-24: qty 2

## 2013-03-24 NOTE — ED Notes (Signed)
Pt from home via [facility] with c/o feeling "croupy" for the last week.  Pt reports non productive cough.  Pt in NAD and A&O.

## 2013-03-24 NOTE — Discharge Instructions (Signed)
Laryngitis At the top of your windpipe is your voice box. It is the source of your voice. Inside your voice box are 2 bands of muscles called vocal cords. When you breathe, your vocal cords are relaxed and open so that air can get into the lungs. When you decide to say something, these cords come together and vibrate. The sound from these vibrations goes into your throat and comes out through your mouth as sound. Laryngitis is an inflammation of the vocal cords that causes hoarseness, cough, loss of voice, sore throat, and dry throat. Laryngitis can be temporary (acute) or long-term (chronic). Most cases of acute laryngitis improve with time.Chronic laryngitis lasts for more than 3 weeks. CAUSES Laryngitis can often be related to excessive smoking, talking, or yelling, as well as inhalation of toxic fumes and allergies. Acute laryngitis is usually caused by a viral infection, vocal strain, measles or mumps, or bacterial infections. Chronic laryngitis is usually caused by vocal cord strain, vocal cord injury, postnasal drip, growths on the vocal cords, or acid reflux. SYMPTOMS   Cough.  Sore throat.  Dry throat. RISK FACTORS  Respiratory infections.  Exposure to irritating substances, such as cigarette smoke, excessive amounts of alcohol, stomach acids, and workplace chemicals.  Voice trauma, such as vocal cord injury from shouting or speaking too loud. DIAGNOSIS  Your cargiver will perform a physical exam. During the physical exam, your caregiver will examine your throat. The most common sign of laryngitis is hoarseness. Laryngoscopy may be necessary to confirm the diagnosis of this condition. This procedure allows your caregiver to look into the larynx. HOME CARE INSTRUCTIONS  Drink enough fluids to keep your urine clear or pale yellow.  Rest until you no longer have symptoms or as directed by your caregiver.  Breathe in moist air.  Take all medicine as directed by your  caregiver.  Do not smoke.  Talk as little as possible (this includes whispering).  Write on paper instead of talking until your voice is back to normal.  Follow up with your caregiver if your condition has not improved after 10 days. SEEK MEDICAL CARE IF:   You have trouble breathing.  You cough up blood.  You have persistent fever.  You have increasing pain.  You have difficulty swallowing. MAKE SURE YOU:  Understand these instructions.  Will watch your condition.  Will get help right away if you are not doing well or get worse. Document Released: 12/18/2004 Document Revised: 03/12/2011 Document Reviewed: 02/23/2010 [employer] Patient Information 2014 [employer].  

## 2013-03-24 NOTE — ED Provider Notes (Signed)
CSN: [id number]     Arrival date & time 03/24/13  [id number] History   First MD Initiated Contact with Patient 03/24/13 0935     Chief Complaint  Patient presents with  . Weakness  . Cough      HPI  Patient presents with a cough. States she's felt like she "has croup" for last week has lost her voice and has a harsh cough. Patient is coughing the point that she thought she was gagging going to vomit this morning. 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 She has a history of diabetes. She lives at home with her daughter. She states she thought she had a fever 2 days ago but not today. No chest pain. No orthopnea.  Past Medical History  Diagnosis Date  . Systolic CHF     Non ischemic, last Cath 09/21/08 - normal left main, LAD, LCx, ramus intermedius, RCA  . Hypertension   . Stroke   . Headache(784.0)   . Diabetes mellitus   . Dementia 05/27/2012  . Osteopenia     DEXA 2012 : T score hip -2.3, femur -2.1  . CHF (congestive heart failure)   . Shortness of breath   . Pacemaker    Past Surgical History  Procedure Laterality Date  . Insert / replace / remove pacemaker  12/15/2010  . Back surgery    . Abdominal hysterectomy     History reviewed. No pertinent family history. History  Substance Use Topics  . Smoking status: Current Every Day Smoker    Types: Cigarettes  . Smokeless tobacco: Never Used  . Alcohol Use: No   OB History   Grav Para Term Preterm Abortions TAB SAB Ect Mult Living                 Review of Systems  Constitutional: Negative for fever, chills, diaphoresis, appetite change and fatigue.  HENT: Positive for voice change. Negative for mouth sores, sore throat and trouble swallowing.   Eyes: Negative for visual disturbance.  Respiratory: Positive for cough. Negative for chest tightness, shortness of breath and wheezing.   Cardiovascular: Negative for chest pain.  Gastrointestinal: Negative for nausea, vomiting, abdominal pain, diarrhea and abdominal  distention.  Endocrine: Negative for polydipsia, polyphagia and polyuria.  Genitourinary: Negative for dysuria, frequency and hematuria.  Musculoskeletal: Negative for gait problem.  Skin: Negative for color change, pallor and rash.  Neurological: Negative for dizziness, syncope, light-headedness and headaches.  Hematological: Does not bruise/bleed easily.  Psychiatric/Behavioral: Negative for behavioral problems and confusion.      Allergies  Review of patient's allergies indicates no known allergies.  Home Medications   Current Outpatient Rx  Name  Route  Sig  Dispense  Refill  . alendronate (FOSAMAX) 70 MG tablet   Oral   Take 70 mg by mouth once a week. Take with a full glass of water on an empty stomach. On wednesdays         . aspirin 325 MG tablet   Oral   Take 325 mg by mouth daily.         Marland Kitchen. atorvastatin (LIPITOR) 40 MG tablet   Oral   Take 40 mg by mouth daily.         . carvedilol (COREG) 3.125 MG tablet   Oral   Take 1 tablet (3.125 mg total) by mouth 2 (two) times daily with a meal.   28 tablet   0   . fenofibrate 160 MG tablet   Oral  Take 160 mg by mouth daily.         . furosemide (LASIX) 40 MG tablet   Oral   Take 1 tablet (40 mg total) by mouth 2 (two) times daily.   62 tablet   0   . insulin aspart (NOVOLOG) 100 UNIT/ML injection   Subcutaneous   Inject 3 Units into the skin 3 (three) times daily before meals.   1 vial   0   . isosorbide mononitrate (IMDUR) 60 MG 24 hr tablet   Oral   Take 60 mg by mouth daily.         Marland Kitchen lisinopril (PRINIVIL,ZESTRIL) 40 MG tablet   Oral   Take 0.5 tablets (20 mg total) by mouth daily.   30 tablet   3   . loratadine-pseudoephedrine (CLARITIN-D 12-HOUR) 5-120 MG per tablet   Oral   Take 1 tablet by mouth 2 (two) times daily as needed for allergies.         . nitroGLYCERIN (NITROSTAT) 0.4 MG SL tablet   Sublingual   Place 1 tablet (0.4 mg total) under the tongue every 5 (five) minutes  as needed for chest pain.   15 tablet   0   . Omega-3 Fatty Acids (FISH OIL) 1200 MG CAPS   Oral   Take 1 capsule by mouth daily.         . pantoprazole (PROTONIX) 40 MG tablet   Oral   Take 40 mg by mouth daily.         . potassium chloride SA (K-DUR,KLOR-CON) 20 MEQ tablet   Oral   Take 20 mEq by mouth daily.         . traMADol (ULTRAM) 50 MG tablet   Oral   Take 1 tablet (50 mg total) by mouth every 6 (six) hours as needed. For pain   120 tablet   3   . azithromycin (ZITHROMAX Z-PAK) 250 MG tablet   Oral   Take 1 tablet (250 mg total) by mouth daily. As directed   6 tablet   0   . benzonatate (TESSALON) 100 MG capsule   Oral   Take 1 capsule (100 mg total) by mouth every 8 (eight) hours.   21 capsule   0   . chlorpheniramine-HYDROcodone (TUSSIONEX PENNKINETIC ER) 10-8 MG/5ML LQCR   Oral   Take 5 mLs by mouth every 12 (twelve) hours.   60 mL   0    BP 171/118  Pulse 103  Temp(Src) 97.9 F (36.6 C) (Oral)  Resp 18  SpO2 99% Physical Exam  Constitutional: She is oriented to person, place, and time. She appears well-developed and well-nourished. No distress.  Awake alert. Slow but appropriate to respond.  HENT:  Head: Normocephalic.  She has a hoarse voice  Eyes: Conjunctivae are normal. Pupils are equal, round, and reactive to light. No scleral icterus.  Neck: Normal range of motion. Neck supple. No thyromegaly present.  Cardiovascular: Normal rate and regular rhythm.  Exam reveals no gallop and no friction rub.   No murmur heard. Pulmonary/Chest: Effort normal and breath sounds normal. No respiratory distress. She has no wheezes. She has no rales.  Lungs. No crackles or rales.  Abdominal: Soft. Bowel sounds are normal. She exhibits no distension. There is no tenderness. There is no rebound.  Musculoskeletal: Normal range of motion.  Neurological: She is alert and oriented to person, place, and time.  Skin: Skin is warm and dry. No rash noted.  1+  symmetric lower extremity edema.  Psychiatric: She has a normal mood and affect. Her behavior is normal.    ED Course  Procedures (including critical care time) Labs Review Labs Reviewed  CBC WITH DIFFERENTIAL - Abnormal; Notable for the following:    RDW 15.9 (*)    All other components within normal limits  BASIC METABOLIC PANEL - Abnormal; Notable for the following:    Glucose, Bld 208 (*)    GFR calc non Af Amer 51 (*)    GFR calc Af Amer 59 (*)    All other components within normal limits  URINALYSIS, ROUTINE W REFLEX MICROSCOPIC - Abnormal; Notable for the following:    Protein, ur >300 (*)    All other components within normal limits  URINE MICROSCOPIC-ADD ON - Abnormal; Notable for the following:    Crystals CA OXALATE CRYSTALS (*)    All other components within normal limits  URINE CULTURE  TROPONIN I   Imaging Review Dg Chest 2 View  03/24/2013   CLINICAL DATA:  Cough and shortness of breath.  Weakness.  EXAM: CHEST  2 VIEW  COMPARISON:  02/09/2013  FINDINGS: AICD in place. Chronic cardiomegaly. Slight pulmonary vascular prominence, unchanged. No acute infiltrates or effusions. Slight scarring at the lung bases posteriorly.  IMPRESSION: No change since the prior study. Chronic cardiomegaly and slight pulmonary vascular prominence.   Electronically Signed   By: Geanie Cooley M.D.   On: 03/24/2013 10:38     EKG Interpretation   Date/Time:  Tuesday March 24 2013 09:48:18 EDT Ventricular Rate:  105 PR Interval:  157 QRS Duration: 146 QT Interval:  394 QTC Calculation: 521 R Axis:   -57 Text Interpretation:  Sinus tachycardia Ventricular premature complex Left  bundle branch block-Noted 02-09-2013 IncreasedHR, otherwise no significant  change vs 02-09-2013 Confirmed by Fayrene Fearing  MD, Gavan Nordby ([id number]) on 03/24/2013  10:10:09 AM      MDM   Final diagnoses:  Laryngitis    Urine does not appear infected. She has normal white blood cell count. Normal electrolytes. No  abnormalities on EKG. She is tachycardic. She has not taken her coreg. Chest x-ray shows minimal pulmonary vascular prominence. No signs of failure per she and her exam. She appropriate for outpatient treatment for her laryngitis.    Rolland Porter, MD 03/24/13 [phone]

## 2013-03-24 NOTE — ED Notes (Signed)
Patient transported to X-ray 

## 2013-03-25 ENCOUNTER — Emergency Department (HOSPITAL_COMMUNITY): Payer: Medicare Other

## 2013-03-25 ENCOUNTER — Encounter (HOSPITAL_COMMUNITY): Payer: Self-pay | Admitting: Internal Medicine

## 2013-03-25 ENCOUNTER — Inpatient Hospital Stay (HOSPITAL_COMMUNITY)
Admission: EM | Admit: 2013-03-25 | Discharge: 2013-03-31 | DRG: 291 | Disposition: A | Discharge status: Skilled Nursing Facility | Payer: Medicare Other | Attending: Internal Medicine | Admitting: Internal Medicine

## 2013-03-25 DIAGNOSIS — M899 Disorder of bone, unspecified: Secondary | ICD-10-CM | POA: Diagnosis present

## 2013-03-25 DIAGNOSIS — R0602 Shortness of breath: Secondary | ICD-10-CM | POA: Diagnosis present

## 2013-03-25 DIAGNOSIS — E1169 Type 2 diabetes mellitus with other specified complication: Secondary | ICD-10-CM

## 2013-03-25 DIAGNOSIS — R079 Chest pain, unspecified: Secondary | ICD-10-CM | POA: Diagnosis present

## 2013-03-25 DIAGNOSIS — E875 Hyperkalemia: Secondary | ICD-10-CM

## 2013-03-25 DIAGNOSIS — E872 Acidosis, unspecified: Secondary | ICD-10-CM | POA: Diagnosis present

## 2013-03-25 DIAGNOSIS — J029 Acute pharyngitis, unspecified: Secondary | ICD-10-CM | POA: Diagnosis present

## 2013-03-25 DIAGNOSIS — I509 Heart failure, unspecified: Secondary | ICD-10-CM | POA: Diagnosis present

## 2013-03-25 DIAGNOSIS — E1165 Type 2 diabetes mellitus with hyperglycemia: Secondary | ICD-10-CM | POA: Diagnosis present

## 2013-03-25 DIAGNOSIS — IMO0001 Reserved for inherently not codable concepts without codable children: Secondary | ICD-10-CM

## 2013-03-25 DIAGNOSIS — Z7982 Long term (current) use of aspirin: Secondary | ICD-10-CM | POA: Diagnosis not present

## 2013-03-25 DIAGNOSIS — J9600 Acute respiratory failure, unspecified whether with hypoxia or hypercapnia: Secondary | ICD-10-CM | POA: Diagnosis present

## 2013-03-25 DIAGNOSIS — M949 Disorder of cartilage, unspecified: Secondary | ICD-10-CM | POA: Diagnosis present

## 2013-03-25 DIAGNOSIS — Z8673 Personal history of transient ischemic attack (TIA), and cerebral infarction without residual deficits: Secondary | ICD-10-CM | POA: Diagnosis not present

## 2013-03-25 DIAGNOSIS — G9340 Encephalopathy, unspecified: Secondary | ICD-10-CM

## 2013-03-25 DIAGNOSIS — I498 Other specified cardiac arrhythmias: Secondary | ICD-10-CM | POA: Diagnosis present

## 2013-03-25 DIAGNOSIS — I5021 Acute systolic (congestive) heart failure: Secondary | ICD-10-CM

## 2013-03-25 DIAGNOSIS — R739 Hyperglycemia, unspecified: Secondary | ICD-10-CM

## 2013-03-25 DIAGNOSIS — Z79899 Other long term (current) drug therapy: Secondary | ICD-10-CM

## 2013-03-25 DIAGNOSIS — I252 Old myocardial infarction: Secondary | ICD-10-CM | POA: Diagnosis not present

## 2013-03-25 DIAGNOSIS — R627 Adult failure to thrive: Secondary | ICD-10-CM

## 2013-03-25 DIAGNOSIS — E440 Moderate protein-calorie malnutrition: Secondary | ICD-10-CM

## 2013-03-25 DIAGNOSIS — I5043 Acute on chronic combined systolic (congestive) and diastolic (congestive) heart failure: Secondary | ICD-10-CM

## 2013-03-25 DIAGNOSIS — E785 Hyperlipidemia, unspecified: Secondary | ICD-10-CM | POA: Diagnosis present

## 2013-03-25 DIAGNOSIS — Z794 Long term (current) use of insulin: Secondary | ICD-10-CM

## 2013-03-25 DIAGNOSIS — E119 Type 2 diabetes mellitus without complications: Secondary | ICD-10-CM | POA: Diagnosis present

## 2013-03-25 DIAGNOSIS — IMO0002 Reserved for concepts with insufficient information to code with codable children: Secondary | ICD-10-CM | POA: Diagnosis present

## 2013-03-25 DIAGNOSIS — N179 Acute kidney failure, unspecified: Secondary | ICD-10-CM | POA: Diagnosis not present

## 2013-03-25 DIAGNOSIS — F17200 Nicotine dependence, unspecified, uncomplicated: Secondary | ICD-10-CM | POA: Diagnosis present

## 2013-03-25 DIAGNOSIS — R55 Syncope and collapse: Secondary | ICD-10-CM | POA: Diagnosis present

## 2013-03-25 DIAGNOSIS — I5023 Acute on chronic systolic (congestive) heart failure: Secondary | ICD-10-CM | POA: Diagnosis present

## 2013-03-25 DIAGNOSIS — F0390 Unspecified dementia without behavioral disturbance: Secondary | ICD-10-CM

## 2013-03-25 DIAGNOSIS — I639 Cerebral infarction, unspecified: Secondary | ICD-10-CM

## 2013-03-25 DIAGNOSIS — E162 Hypoglycemia, unspecified: Secondary | ICD-10-CM

## 2013-03-25 DIAGNOSIS — Z95810 Presence of automatic (implantable) cardiac defibrillator: Secondary | ICD-10-CM

## 2013-03-25 DIAGNOSIS — I10 Essential (primary) hypertension: Secondary | ICD-10-CM | POA: Diagnosis present

## 2013-03-25 HISTORY — DX: Other nontraumatic intracerebral hemorrhage: I61.8

## 2013-03-25 HISTORY — DX: Presence of automatic (implantable) cardiac defibrillator: Z95.810

## 2013-03-25 HISTORY — DX: Acute myocardial infarction, unspecified: I21.9

## 2013-03-25 LAB — CBC WITH DIFFERENTIAL/PLATELET
Basophils Absolute: 0 10*3/uL (ref 0.0–0.1)
Basophils Relative: 0 % (ref 0–1)
Eosinophils Absolute: 0.1 10*3/uL (ref 0.0–0.7)
Eosinophils Relative: 2 % (ref 0–5)
HEMATOCRIT: 35.9 % — AB (ref 36.0–46.0)
Hemoglobin: 11.8 g/dL — ABNORMAL LOW (ref 12.0–15.0)
Lymphocytes Relative: 29 % (ref 12–46)
Lymphs Abs: 1.9 10*3/uL (ref 0.7–4.0)
MCH: 28.9 pg (ref 26.0–34.0)
MCHC: 32.9 g/dL (ref 30.0–36.0)
MCV: 88 fL (ref 78.0–100.0)
MONO ABS: 0.6 10*3/uL (ref 0.1–1.0)
MONOS PCT: 9 % (ref 3–12)
NEUTROS ABS: 4 10*3/uL (ref 1.7–7.7)
NEUTROS PCT: 61 % (ref 43–77)
Platelets: 227 10*3/uL (ref 150–400)
RBC: 4.08 MIL/uL (ref 3.87–5.11)
RDW: 15.8 % — ABNORMAL HIGH (ref 11.5–15.5)
WBC: 6.6 10*3/uL (ref 4.0–10.5)

## 2013-03-25 LAB — URINE CULTURE
COLONY COUNT: NO GROWTH
CULTURE: NO GROWTH

## 2013-03-25 LAB — COMPREHENSIVE METABOLIC PANEL
ALT: 43 U/L — AB (ref 0–35)
AST: 30 U/L (ref 0–37)
Albumin: 3.8 g/dL (ref 3.5–5.2)
Alkaline Phosphatase: 72 U/L (ref 39–117)
BUN: 32 mg/dL — AB (ref 6–23)
CALCIUM: 10.9 mg/dL — AB (ref 8.4–10.5)
CO2: 18 meq/L — AB (ref 19–32)
CREATININE: 1.09 mg/dL (ref 0.50–1.10)
Chloride: 97 mEq/L (ref 96–112)
GFR calc Af Amer: 57 mL/min — ABNORMAL LOW (ref >90)
GFR, EST NON AFRICAN AMERICAN: 49 mL/min — AB (ref >90)
GLUCOSE: 192 mg/dL — AB (ref 70–99)
Potassium: 4.4 mEq/L (ref 3.7–5.3)
SODIUM: 134 meq/L — AB (ref 137–147)
TOTAL PROTEIN: 6.9 g/dL (ref 6.0–8.3)
Total Bilirubin: 0.6 mg/dL (ref 0.3–1.2)

## 2013-03-25 LAB — I-STAT ARTERIAL BLOOD GAS, ED
Acid-base deficit: 3 mmol/L — ABNORMAL HIGH (ref 0.0–2.0)
Bicarbonate: 22.1 mEq/L (ref 20.0–24.0)
O2 Saturation: 95 %
PCO2 ART: 38.5 mmHg (ref 35.0–45.0)
TCO2: 23 mmol/L (ref 0–100)
pH, Arterial: 7.368 (ref 7.350–7.450)
pO2, Arterial: 80 mmHg (ref 80.0–100.0)

## 2013-03-25 LAB — I-STAT TROPONIN, ED: Troponin i, poc: 0.09 ng/mL (ref 0.00–0.08)

## 2013-03-25 LAB — POC OCCULT BLOOD, ED: Fecal Occult Bld: NEGATIVE

## 2013-03-25 LAB — TROPONIN I

## 2013-03-25 LAB — I-STAT CG4 LACTIC ACID, ED: LACTIC ACID, VENOUS: 2.3 mmol/L — AB (ref 0.5–2.2)

## 2013-03-25 LAB — CBG MONITORING, ED: Glucose-Capillary: 171 mg/dL — ABNORMAL HIGH (ref 70–99)

## 2013-03-25 LAB — PRO B NATRIURETIC PEPTIDE: Pro B Natriuretic peptide (BNP): 7979 pg/mL — ABNORMAL HIGH (ref 0–125)

## 2013-03-25 LAB — GLUCOSE, CAPILLARY: Glucose-Capillary: 133 mg/dL — ABNORMAL HIGH (ref 70–99)

## 2013-03-25 MED ORDER — SODIUM CHLORIDE 0.9 % IJ SOLN
3.0000 mL | Freq: Two times a day (BID) | INTRAMUSCULAR | Status: DC
  Administered 2013-03-25 – 2013-03-31 (×7): 3 mL via INTRAVENOUS

## 2013-03-25 MED ORDER — SODIUM CHLORIDE 0.9 % IJ SOLN
3.0000 mL | Freq: Two times a day (BID) | INTRAMUSCULAR | Status: DC
  Administered 2013-03-26 – 2013-03-31 (×8): 3 mL via INTRAVENOUS

## 2013-03-25 MED ORDER — PANTOPRAZOLE SODIUM 40 MG PO TBEC
40.0000 mg | DELAYED_RELEASE_TABLET | Freq: Every day | ORAL | Status: DC
  Administered 2013-03-26 – 2013-03-31 (×6): 40 mg via ORAL
  Filled 2013-03-25 (×5): qty 1

## 2013-03-25 MED ORDER — NITROGLYCERIN 0.4 MG SL SUBL: 0.4000 mg | SUBLINGUAL_TABLET | SUBLINGUAL | Status: DC | PRN

## 2013-03-25 MED ORDER — ONDANSETRON HCL 4 MG PO TABS
4.0000 mg | ORAL_TABLET | Freq: Four times a day (QID) | ORAL | Status: DC | PRN
  Administered 2013-03-28: 4 mg via ORAL
  Filled 2013-03-25: qty 1

## 2013-03-25 MED ORDER — LISINOPRIL 20 MG PO TABS
20.0000 mg | ORAL_TABLET | Freq: Every day | ORAL | Status: DC
  Administered 2013-03-26 – 2013-03-31 (×6): 20 mg via ORAL
  Filled 2013-03-25 (×6): qty 1

## 2013-03-25 MED ORDER — ACETAMINOPHEN 650 MG RE SUPP: 650.0000 mg | Freq: Four times a day (QID) | RECTAL | Status: DC | PRN

## 2013-03-25 MED ORDER — FUROSEMIDE 10 MG/ML IJ SOLN
80.0000 mg | Freq: Once | INTRAMUSCULAR | Status: AC
  Administered 2013-03-25: 80 mg via INTRAVENOUS
  Filled 2013-03-25: qty 8

## 2013-03-25 MED ORDER — ENOXAPARIN SODIUM 40 MG/0.4ML ~~LOC~~ SOLN
40.0000 mg | SUBCUTANEOUS | Status: DC
  Administered 2013-03-25 – 2013-03-30 (×6): 40 mg via SUBCUTANEOUS
  Filled 2013-03-25 (×7): qty 0.4

## 2013-03-25 MED ORDER — ASPIRIN 325 MG PO TABS: 325.0000 mg | ORAL_TABLET | Freq: Every day | ORAL | Status: DC

## 2013-03-25 MED ORDER — CARVEDILOL 3.125 MG PO TABS
3.1250 mg | ORAL_TABLET | Freq: Two times a day (BID) | ORAL | Status: DC
  Administered 2013-03-26 – 2013-03-31 (×11): 3.125 mg via ORAL
  Filled 2013-03-25 (×13): qty 1

## 2013-03-25 MED ORDER — OMEGA-3-ACID ETHYL ESTERS 1 G PO CAPS
1.0000 g | ORAL_CAPSULE | Freq: Every day | ORAL | Status: DC
  Administered 2013-03-26 – 2013-03-31 (×6): 1 g via ORAL
  Filled 2013-03-25 (×6): qty 1

## 2013-03-25 MED ORDER — INSULIN ASPART 100 UNIT/ML ~~LOC~~ SOLN
0.0000 [IU] | Freq: Three times a day (TID) | SUBCUTANEOUS | Status: DC
  Administered 2013-03-26: 1 [IU] via SUBCUTANEOUS
  Administered 2013-03-26 – 2013-03-28 (×5): 2 [IU] via SUBCUTANEOUS
  Administered 2013-03-28: 1 [IU] via SUBCUTANEOUS
  Administered 2013-03-28: 2 [IU] via SUBCUTANEOUS
  Administered 2013-03-29 (×2): 1 [IU] via SUBCUTANEOUS
  Administered 2013-03-29: 2 [IU] via SUBCUTANEOUS
  Administered 2013-03-30: 3 [IU] via SUBCUTANEOUS
  Administered 2013-03-31: 2 [IU] via SUBCUTANEOUS
  Administered 2013-03-31: 1 [IU] via SUBCUTANEOUS

## 2013-03-25 MED ORDER — FUROSEMIDE 10 MG/ML IJ SOLN
40.0000 mg | Freq: Two times a day (BID) | INTRAMUSCULAR | Status: DC
  Administered 2013-03-26 – 2013-03-29 (×7): 40 mg via INTRAVENOUS
  Filled 2013-03-25 (×9): qty 4

## 2013-03-25 MED ORDER — FISH OIL 1200 MG PO CAPS: 1.0000 | ORAL_CAPSULE | Freq: Every day | ORAL | Status: DC

## 2013-03-25 MED ORDER — LEVOFLOXACIN IN D5W 750 MG/150ML IV SOLN
750.0000 mg | INTRAVENOUS | Status: DC
  Administered 2013-03-26 – 2013-03-27 (×2): 750 mg via INTRAVENOUS
  Filled 2013-03-25 (×3): qty 150

## 2013-03-25 MED ORDER — TRAMADOL HCL 50 MG PO TABS
50.0000 mg | ORAL_TABLET | Freq: Four times a day (QID) | ORAL | Status: DC | PRN
  Administered 2013-03-28 – 2013-03-29 (×3): 50 mg via ORAL
  Filled 2013-03-25 (×3): qty 1

## 2013-03-25 MED ORDER — ATORVASTATIN CALCIUM 40 MG PO TABS
40.0000 mg | ORAL_TABLET | Freq: Every day | ORAL | Status: DC
  Administered 2013-03-26 – 2013-03-31 (×6): 40 mg via ORAL
  Filled 2013-03-25 (×6): qty 1

## 2013-03-25 MED ORDER — NITROGLYCERIN 0.4 MG SL SUBL
0.4000 mg | SUBLINGUAL_TABLET | SUBLINGUAL | Status: DC | PRN
  Administered 2013-03-25 (×3): 0.4 mg via SUBLINGUAL

## 2013-03-25 MED ORDER — FENOFIBRATE 160 MG PO TABS
160.0000 mg | ORAL_TABLET | Freq: Every day | ORAL | Status: DC
  Administered 2013-03-26 – 2013-03-31 (×6): 160 mg via ORAL
  Filled 2013-03-25 (×6): qty 1

## 2013-03-25 MED ORDER — ASPIRIN EC 325 MG PO TBEC
325.0000 mg | DELAYED_RELEASE_TABLET | Freq: Every day | ORAL | Status: DC
  Administered 2013-03-25 – 2013-03-31 (×7): 325 mg via ORAL
  Filled 2013-03-25 (×7): qty 1

## 2013-03-25 MED ORDER — ONDANSETRON HCL 4 MG/2ML IJ SOLN: 4.0000 mg | Freq: Four times a day (QID) | INTRAMUSCULAR | Status: DC | PRN

## 2013-03-25 MED ORDER — HYDRALAZINE HCL 20 MG/ML IJ SOLN
10.0000 mg | INTRAMUSCULAR | Status: DC | PRN
  Administered 2013-03-25: 10 mg via INTRAVENOUS
  Filled 2013-03-25: qty 1

## 2013-03-25 MED ORDER — POTASSIUM CHLORIDE CRYS ER 20 MEQ PO TBCR
20.0000 meq | EXTENDED_RELEASE_TABLET | Freq: Every day | ORAL | Status: DC
  Administered 2013-03-26: 20 meq via ORAL
  Filled 2013-03-25 (×2): qty 1

## 2013-03-25 MED ORDER — ACETAMINOPHEN 325 MG PO TABS
650.0000 mg | ORAL_TABLET | Freq: Four times a day (QID) | ORAL | Status: DC | PRN
  Administered 2013-03-26 (×2): 650 mg via ORAL
  Filled 2013-03-25 (×2): qty 2

## 2013-03-25 MED ORDER — ISOSORBIDE MONONITRATE ER 60 MG PO TB24
60.0000 mg | ORAL_TABLET | Freq: Every day | ORAL | Status: DC
  Administered 2013-03-26 – 2013-03-31 (×6): 60 mg via ORAL
  Filled 2013-03-25 (×6): qty 1

## 2013-03-25 NOTE — Progress Notes (Signed)
Patient's BP is 169/149, Hydralazine 10mg  given IV given per order; will continue to monitor patient. Lorretta Harp RN

## 2013-03-25 NOTE — ED Notes (Addendum)
Cg-4 result and elevated troponin shown to Dr. Micheline Maze

## 2013-03-25 NOTE — Progress Notes (Signed)
ANTIBIOTIC CONSULT NOTE - INITIAL  Pharmacy Consult for levaquin Indication: bronchitis  No Known Allergies  Patient Measurements: Height: 5\' 5"  (165.1 cm) Weight: 180 lb 5.4 oz (81.8 kg) IBW/kg (Calculated) : 57   Vital Signs: Temp: 98.4 F (36.9 C) (03/25 2147) Temp src: Oral (03/25 2147) BP: 159/124 mmHg (03/25 2147) Pulse Rate: 101 (03/25 2147) Intake/Output from previous day:   Intake/Output from this shift: Total I/O In: 3 [I.V.:3] Out: 650 [Urine:650]  Labs:  Recent Labs  03/24/13 1005 03/25/13 1730 03/25/13 1856  WBC 5.9  --  6.6  HGB 12.4  --  11.8*  PLT 223  --  227  CREATININE 1.06 1.09  --    Estimated Creatinine Clearance: 48.5 ml/min (by C-G formula based on Cr of 1.09). No results found for this basename: Rolm GalaVANCOTROUGH, VANCOPEAK, VANCORANDOM, GENTTROUGH, GENTPEAK, GENTRANDOM, TOBRATROUGH, TOBRAPEAK, TOBRARND, AMIKACINPEAK, AMIKACINTROU, AMIKACIN,  in the last 72 hours   Microbiology: Recent Results (from the past 720 hour(s))  URINE CULTURE     Status: None   Collection Time    03/24/13 11:35 AM      Result Value Ref Range Status   Specimen Description URINE, CATHETERIZED   Final   Special Requests NONE   Final   Culture  Setup Time     Final   Value: 03/24/2013 18:05     [facility]formed at [facility]   Colony Count     Final   Value: NO GROWTH     [facility]formed at [facility]   Culture     Final   Value: NO GROWTH     Performed at [facility]   Report Status 03/25/2013 FINAL   Final    Medical History: Past Medical History  Diagnosis Date  . Systolic CHF     Non ischemic, last Cath 09/21/08 - normal left main, LAD, LCx, ramus intermedius, RCA  . Hypertension   . Stroke   . Headache(784.0)   . Diabetes mellitus   . Dementia 05/27/2012  . Osteopenia     DEXA 2012 : T score hip -2.3, femur -2.1  . CHF (congestive heart failure)   . Shortness of breath   . Pacemaker     Medications:  Prescriptions prior to  admission  Medication Sig Dispense Refill  . alendronate (FOSAMAX) 70 MG tablet Take 70 mg by mouth once a week. Take with a full glass of water on an empty stomach. On wednesdays      . aspirin 325 MG tablet Take 325 mg by mouth daily.      Marland Kitchen. atorvastatin (LIPITOR) 40 MG tablet Take 40 mg by mouth daily.      Marland Kitchen. azithromycin (ZITHROMAX Z-PAK) 250 MG tablet Take 1 tablet (250 mg total) by mouth daily. As directed  6 tablet  0  . benzonatate (TESSALON) 100 MG capsule Take 1 capsule (100 mg total) by mouth every 8 (eight) hours.  21 capsule  0  . carvedilol (COREG) 3.125 MG tablet Take 1 tablet (3.125 mg total) by mouth 2 (two) times daily with a meal.  28 tablet  0  . chlorpheniramine-HYDROcodone (TUSSIONEX PENNKINETIC ER) 10-8 MG/5ML LQCR Take 5 mLs by mouth every 12 (twelve) hours.  60 mL  0  . fenofibrate 160 MG tablet Take 160 mg by mouth daily.      . furosemide (LASIX) 40 MG tablet Take 1 tablet (40 mg total) by mouth 2 (two) times daily.  62 tablet  0  .  insulin aspart (NOVOLOG) 100 UNIT/ML injection Inject 3 Units into the skin 3 (three) times daily before meals.  1 vial  0  . isosorbide mononitrate (IMDUR) 60 MG 24 hr tablet Take 60 mg by mouth daily.      Marland Kitchen lisinopril (PRINIVIL,ZESTRIL) 40 MG tablet Take 0.5 tablets (20 mg total) by mouth daily.  30 tablet  3  . loratadine-pseudoephedrine (CLARITIN-D 12-HOUR) 5-120 MG per tablet Take 1 tablet by mouth 2 (two) times daily as needed for allergies.      . nitroGLYCERIN (NITROSTAT) 0.4 MG SL tablet Place 1 tablet (0.4 mg total) under the tongue every 5 (five) minutes as needed for chest pain.  15 tablet  0  . Omega-3 Fatty Acids (FISH OIL) 1200 MG CAPS Take 1 capsule by mouth daily.      . pantoprazole (PROTONIX) 40 MG tablet Take 40 mg by mouth daily.      . potassium chloride SA (K-DUR,KLOR-CON) 20 MEQ tablet Take 20 mEq by mouth daily.      . traMADol (ULTRAM) 50 MG tablet Take 1 tablet (50 mg total) by mouth every 6 (six) hours as needed.  For pain  120 tablet  3   Assessment: 73 yo lady to start levaquin for bronchitis.  She is afebrile and WBC 6.6.  She was seen in ED yesterday and dx PNA.  CrCl ~48 ml/min  Goal of Therapy:  Eradication of infection  Plan:  Levaquin 750 mg IV q48 hours. Monitor renal function, clinical course and cultures.  Thanks for allowing pharmacy to be a part of this patient's care.  Talbert Cage, PharmD Clinical Pharmacist, [phone] 03/25/2013,10:52 PM

## 2013-03-25 NOTE — ED Notes (Signed)
Pt arrived via EMS related to episodes of decrease responsiveness. Pt seen in er yesterday dx Pneumonia. Per ems on arrival pt was falling asleep while eating but easy to arouse. Pt was diaphoretic on scene. Alert x4 and MAE at this time.

## 2013-03-25 NOTE — ED Provider Notes (Signed)
CSN: [id number]     Arrival date & time 03/25/13  1504 History   First MD Initiated Contact with Patient 03/25/13 1551     Chief Complaint  Patient presents with  . Near Syncope     HPI 73 year old female with systolic congestive heart failure, hypertension, diabetes presents with shortness of breath. The patient is a very poor historian secondary to dementia, therefore all history is obtained from EMS; they obtained history from her daughter. Reportedly the patient was seen here yesterday for a cough. In the interim she's had worsening shortness of breath. Her daughter reported that she thought she was having chest pain. The patient tells me that she had chest pain last night around dinnertime and it recurred today prior to arrival. She says that she was having a sensation of her heart racing at the same time as the chest pain her lab workup is pertinent for sodium 134, bicarbonate 18, BUN 32, normal CBC, BNP 7979. Her daughter told EMS that she "almost passed out." The patient cannot recall the details around the time that her daughter called EMS. EMS reported that the patient was lethargic, falling asleep in the middle of the conversation during transport to the hospital. They also report that she was very diaphoretic.  On arrival the patient is hypertensive to 176/110, sats 100% on room air, mildly tachypneic, appears visibly dyspneic, tachycardic to 110, afebrile. She has no JVD, normal heart sounds, chest is relatively clear to auscultation with faint rales at the bases. She has mild bilateral nonpitting edema in her lower extremities.   Past Medical History  Diagnosis Date  . Systolic CHF     Non ischemic, last Cath 09/21/08 - normal left main, LAD, LCx, ramus intermedius, RCA  . Hypertension   . Stroke   . Headache(784.0)   . Diabetes mellitus   . Dementia 05/27/2012  . Osteopenia     DEXA 2012 : T score hip -2.3, femur -2.1  . CHF (congestive heart failure)   . Shortness of breath    . Pacemaker    Past Surgical History  Procedure Laterality Date  . Insert / replace / remove pacemaker  12/15/2010  . Back surgery    . Abdominal hysterectomy     History reviewed. No pertinent family history. History  Substance Use Topics  . Smoking status: Current Every Day Smoker    Types: Cigarettes  . Smokeless tobacco: Never Used  . Alcohol Use: No   OB History   Grav Para Term Preterm Abortions TAB SAB Ect Mult Living                 Review of Systems  Unable to perform ROS: Dementia  Constitutional: Positive for diaphoresis.  Respiratory: Positive for cough and shortness of breath.   Cardiovascular: Positive for chest pain and palpitations.  Neurological: Positive for light-headedness.  Psychiatric/Behavioral: Positive for confusion.      Allergies  Review of patient's allergies indicates no known allergies.  Home Medications   No current outpatient prescriptions on file. BP 147/90  Pulse 106  Temp(Src) 97.6 F (36.4 C) (Oral)  Resp 18  Ht 5\' 5"  (1.651 m)  Wt 180 lb 5.4 oz (81.8 kg)  BMI 30.01 kg/m2  SpO2 100% Physical Exam  Nursing note and vitals reviewed. Constitutional: She appears well-developed and well-nourished.  Visibly dyspneic but no overt respiratory distress, ill appearing.  HENT:  Head: Normocephalic and atraumatic.  Mouth/Throat: Oropharynx is clear and moist.  Eyes: EOM are  normal. Pupils are equal, round, and reactive to light. No scleral icterus.  Neck: Normal range of motion. Neck supple. No JVD present. No tracheal deviation present.  Cardiovascular: Regular rhythm, normal heart sounds and intact distal pulses.  Tachycardia present.  Exam reveals no gallop and no friction rub.   No murmur heard. Pulmonary/Chest: No stridor.  Visibly dyspneic, tachypneic to 25, sats 84% on room and, rales at bilateral bases  Abdominal: Soft. She exhibits no distension. There is no tenderness. There is no rebound and no guarding.   Musculoskeletal: She exhibits edema (moderate nonpitting edema of bilateral lower extremities).  Neurological: No cranial nerve deficit. She exhibits normal muscle tone.  Somnolent, oriented to person and place but not time  Skin: Skin is warm and dry. No rash noted.    ED Course  Procedures (including critical care time) Labs Review Labs Reviewed  COMPREHENSIVE METABOLIC PANEL - Abnormal; Notable for the following:    Sodium 134 (*)    CO2 18 (*)    Glucose, Bld 192 (*)    BUN 32 (*)    Calcium 10.9 (*)    ALT 43 (*)    GFR calc non Af Amer 49 (*)    GFR calc Af Amer 57 (*)    All other components within normal limits  PRO B NATRIURETIC PEPTIDE - Abnormal; Notable for the following:    Pro B Natriuretic peptide (BNP) 7979.0 (*)    All other components within normal limits  CBC WITH DIFFERENTIAL - Abnormal; Notable for the following:    Hemoglobin 11.8 (*)    HCT 35.9 (*)    RDW 15.8 (*)    All other components within normal limits  I-STAT CG4 LACTIC ACID, ED - Abnormal; Notable for the following:    Lactic Acid, Venous 2.30 (*)    All other components within normal limits  I-STAT TROPOININ, ED - Abnormal; Notable for the following:    Troponin i, poc 0.09 (*)    All other components within normal limits  CBG MONITORING, ED - Abnormal; Notable for the following:    Glucose-Capillary 171 (*)    All other components within normal limits  I-STAT ARTERIAL BLOOD GAS, ED - Abnormal; Notable for the following:    Acid-base deficit 3.0 (*)    All other components within normal limits  TROPONIN I  CBC WITH DIFFERENTIAL  POC OCCULT BLOOD, ED   Imaging Review Dg Chest 2 View  03/25/2013   CLINICAL DATA:  Near syncope.  Left chest pain.  EXAM: CHEST  2 VIEW  COMPARISON:  03/24/2013  FINDINGS: Left pacer/AICD remains in place, unchanged. Cardiomegaly. Lungs are clear. No effusions. No acute bony abnormality.  IMPRESSION: Cardiomegaly.  No active disease.   Electronically Signed    By: Charlett NoseKevin  Dover M.D.   On: 03/25/2013 18:20   Dg Chest 2 View  03/24/2013   CLINICAL DATA:  Cough and shortness of breath.  Weakness.  EXAM: CHEST  2 VIEW  COMPARISON:  02/09/2013  FINDINGS: AICD in place. Chronic cardiomegaly. Slight pulmonary vascular prominence, unchanged. No acute infiltrates or effusions. Slight scarring at the lung bases posteriorly.  IMPRESSION: No change since the prior study. Chronic cardiomegaly and slight pulmonary vascular prominence.   Electronically Signed   By: Geanie CooleyJim  Maxwell M.D.   On: 03/24/2013 10:38     EKG Interpretation None      MDM  73 year old female, congestive heart failure, hypertension, stroke, diabetes, dementia Poor historian Lives with daughter Seen  here yesterday for cough Complains of shortness of breath, chest pain. Palpitations.  Reported near syncope by family.  She cannot recall details. She was reported found by EMS with decreased responsiveness, falling asleep during conversation, diaphoretic. Hypertensive to 176/110, sats 100% on room air, mild tachypnea, appears visibly dyspneic, tachycardic to 110, afebrile. No JVD, normal heart sounds, chest clear to auscultation, abdomen soft and nontender, no peripheral edema.  When I first evaluated the patient, she denied ongoing chest pain. She had fairly normal work of breathing and lungs were clear to auscultation. She did appear mildly dyspneic.  When I reevaluated the patient sometime later, she was very diaphoretic, more somnolent, appear very uncomfortable though she denies any pain, room air sats were 88%, more rales at bilateral bases. Her blood pressure has spiked to 200 systolic. At this time I asked the nurse to administer sublingual nitroglycerin x3 for blood pressure. Her point-of-care troponin is noted to be mildly positive.  Again reevaluated patient sometime later and found her to appear much more comfortable, blood pressure 160 systolic down from 200 and last evaluation. She's  received 40 mg IV Lasix in the interim. She is no longer in any distress. She states she feels fine.  Labs pertinent for BNP 7900, although point-of-care troponin was positive, traditional troponin is undetectable. Bicarbonate 18, lactate 2.3, arterial blood gas is relatively normal, normal pH, normal CO2.  Impression is this patient has hypoxic respiratory failure, likely secondary to acute on chronic systolic congestive heart failure. Given her elevated lactate and her metabolic acidosis there may be another process driving his decompensation which is not as apparent. She's not had any significant abdominal pain and her abdomen is benign. She has no fever including a rectal temperature that is normal. Chest x-rays not indicative of pneumonia. Awaiting cath urine.  Admitted to medicine for further management.    Final diagnoses:  Acute on chronic systolic heart failure  Acute respiratory failure  Diabetes type 2, uncontrolled  HTN (hypertension)      Toney Sang, MD 03/26/13 [phone]

## 2013-03-25 NOTE — ED Notes (Signed)
Admitting MD at bedside.

## 2013-03-25 NOTE — H&P (Signed)
[facility] History and Physical  Paula [name] EMV:[id number] DOB: October 24, 1940 DOA: 03/25/2013  Referring physician: ER physician. PCP: [name], MD   Chief Complaint: Shortness of breath.  HPI: Paula [name] is a 73 y.o. female with history of chronic systolic heart failure status post AICD placement last EF 25%, hypertension, diabetes mellitus, hyperlipidemia who was admitted last month for CHF and stroke has been experiencing shortness of breath with cough over the last 4 days. Patient had come to the ER yesterday with cough and short of breath and was discharged home on antibiotics for possible laryngitis. Today patient was found to be lethargic and had a syncopal episode witnessed by patient's daughter. In the ER patient was found to be acutely short of breath with hypertension. Patient was given Lasix 80 mg IV and there has been admitted for further management. Patient states in addition the shortness of breath patient has been having chest pain left anterior chest wall pressure-like nonradiating. EKG shows sinus tachycardia with point Troponins being positive but regular troponin was negative. Patient also had one episode of diarrhea yesterday as per patient's daughter. Patient was having drenching sweats.   Review of Systems: As presented in the history of presenting illness, rest negative.  Past Medical History  Diagnosis Date  . Systolic CHF     Non ischemic, last Cath 09/21/08 - normal left main, LAD, LCx, ramus intermedius, RCA  . Hypertension   . Stroke   . Headache(784.0)   . Diabetes mellitus   . Dementia 05/27/2012  . Osteopenia     DEXA 2012 : T score hip -2.3, femur -2.1  . CHF (congestive heart failure)   . Shortness of breath   . Pacemaker    Past Surgical History  Procedure Laterality Date  . Insert / replace / remove pacemaker  12/15/2010  . Back surgery    . Abdominal hysterectomy     Social History:  reports that she has been smoking  Cigarettes.  She has been smoking about 0.00 packs per day. She has never used smokeless tobacco. She reports that she does not drink alcohol or use illicit drugs. Where does patient live in home. Can patient participate in ADLs? Yes.  No Known Allergies  Family History: History reviewed. No pertinent family history.    Prior to Admission medications   Medication Sig Start Date End Date Taking? Authorizing Provider  alendronate (FOSAMAX) 70 MG tablet Take 70 mg by mouth once a week. Take with a full glass of water on an empty stomach. On wednesdays    Historical Provider, MD  aspirin 325 MG tablet Take 325 mg by mouth daily.    Historical Provider, MD  atorvastatin (LIPITOR) 40 MG tablet Take 40 mg by mouth daily.    Historical Provider, MD  azithromycin (ZITHROMAX Z-PAK) 250 MG tablet Take 1 tablet (250 mg total) by mouth daily. As directed 03/24/13   Rolland Porter, MD  benzonatate (TESSALON) 100 MG capsule Take 1 capsule (100 mg total) by mouth every 8 (eight) hours. 03/24/13   Rolland Porter, MD  carvedilol (COREG) 3.125 MG tablet Take 1 tablet (3.125 mg total) by mouth 2 (two) times daily with a meal. 02/06/13   Evelena Peat, DO  chlorpheniramine-HYDROcodone [facility] PENNKINETIC ER) 10-8 MG/5ML LQCR Take 5 mLs by mouth every 12 (twelve) hours. 03/24/13   Rolland Porter, MD  fenofibrate 160 MG tablet Take 160 mg by mouth daily.    Historical Provider, MD  furosemide (LASIX) 40 MG tablet  Take 1 tablet (40 mg total) by mouth 2 (two) times daily. 02/14/13   Rodolph Bonganiel V Thompson, MD  insulin aspart (NOVOLOG) 100 UNIT/ML injection Inject 3 Units into the skin 3 (three) times daily before meals. 02/14/13   Rodolph Bonganiel V Thompson, MD  isosorbide mononitrate (IMDUR) 60 MG 24 hr tablet Take 60 mg by mouth daily.    Historical Provider, MD  lisinopril (PRINIVIL,ZESTRIL) 40 MG tablet Take 0.5 tablets (20 mg total) by mouth daily. 02/08/13   Genelle GatherKathryn F Glenn, MD  loratadine-pseudoephedrine (CLARITIN-D 12-HOUR) 5-120 MG per tablet  Take 1 tablet by mouth 2 (two) times daily as needed for allergies.    Historical Provider, MD  nitroGLYCERIN (NITROSTAT) 0.4 MG SL tablet Place 1 tablet (0.4 mg total) under the tongue every 5 (five) minutes as needed for chest pain. 02/14/13   Rodolph Bonganiel V Thompson, MD  Omega-3 Fatty Acids (FISH OIL) 1200 MG CAPS Take 1 capsule by mouth daily.    Historical Provider, MD  pantoprazole (PROTONIX) 40 MG tablet Take 40 mg by mouth daily.    Historical Provider, MD  potassium chloride SA (K-DUR,KLOR-CON) 20 MEQ tablet Take 20 mEq by mouth daily.    Historical Provider, MD  traMADol (ULTRAM) 50 MG tablet Take 1 tablet (50 mg total) by mouth every 6 (six) hours as needed. For pain 07/10/12   Kermit Baloiffany L Reed, DO    Physical Exam: Filed Vitals:   03/25/13 1757 03/25/13 1830 03/25/13 1900 03/25/13 2028  BP:  154/131 166/123 167/152  Pulse:  107 112 115  Temp: 98.9 F (37.2 C)     TempSrc: Rectal     Resp:    20  SpO2:  98% 99% 100%     General:  Well-developed well-nourished.  Eyes: Anicteric no pallor.  ENT: No discharge from the ears eyes nose mouth.  Neck: No mass felt.  Cardiovascular: S1-S2 heard.  Respiratory: No rhonchi or crepitation.  Abdomen: Soft nontender bowel sounds present.  Skin: No rash.  Musculoskeletal: No edema.  Psychiatric: Appears normal.  Neurologic: Alert awake oriented to time place and person. Moves all extremities.  Labs on Admission:  Basic Metabolic Panel:  Recent Labs Lab 03/24/13 1005 03/25/13 1730  NA 137 134*  K 3.9 4.4  CL 102 97  CO2 20 18*  GLUCOSE 208* 192*  BUN 22 32*  CREATININE 1.06 1.09  CALCIUM 10.2 10.9*   Liver Function Tests:  Recent Labs Lab 03/25/13 1730  AST 30  ALT 43*  ALKPHOS 72  BILITOT 0.6  PROT 6.9  ALBUMIN 3.8   No results found for this basename: LIPASE, AMYLASE,  in the last 168 hours No results found for this basename: AMMONIA,  in the last 168 hours CBC:  Recent Labs Lab 03/24/13 1005  03/25/13 1856  WBC 5.9 6.6  NEUTROABS 3.7 4.0  HGB 12.4 11.8*  HCT 38.3 35.9*  MCV 89.1 88.0  PLT 223 227   Cardiac Enzymes:  Recent Labs Lab 03/24/13 1005 03/25/13 1856  TROPONINI <0.30 <0.30    BNP (last 3 results)  Recent Labs  02/09/13 1439 02/12/13 0517 03/25/13 1730  PROBNP 5031.0* 1839.0* 7979.0*   CBG:  Recent Labs Lab 03/25/13 1744  GLUCAP 171*    Radiological Exams on Admission: Dg Chest 2 View  03/25/2013   CLINICAL DATA:  Near syncope.  Left chest pain.  EXAM: CHEST  2 VIEW  COMPARISON:  03/24/2013  FINDINGS: Left pacer/AICD remains in place, unchanged. Cardiomegaly. Lungs are clear. No effusions.  No acute bony abnormality.  IMPRESSION: Cardiomegaly.  No active disease.   Electronically Signed   By: Charlett Nose M.D.   On: 03/25/2013 18:20   Dg Chest 2 View  03/24/2013   CLINICAL DATA:  Cough and shortness of breath.  Weakness.  EXAM: CHEST  2 VIEW  COMPARISON:  02/09/2013  FINDINGS: AICD in place. Chronic cardiomegaly. Slight pulmonary vascular prominence, unchanged. No acute infiltrates or effusions. Slight scarring at the lung bases posteriorly.  IMPRESSION: No change since the prior study. Chronic cardiomegaly and slight pulmonary vascular prominence.   Electronically Signed   By: Geanie Cooley M.D.   On: 03/24/2013 10:38    EKG: Independently reviewed. Sinus tachycardia with LBBB.  Assessment/Plan Principal Problem:   Acute respiratory failure Active Problems:   DM2 (diabetes mellitus, type 2)   HTN (hypertension)   Congestive heart failure   1. Acute respiratory failure secondary CHF - patient had received one dose of Lasix 80 mg IV and I have placed patient on 40 mg IV every 12. Closely follow patient's respiratory status in telemetry. Closely follow intake output and metabolic panel. For now I have continued patient's antibiotics since patient's daughter states that she's was having frequent spells of drenching sweats and fever. 2. Syncope -  probably secondary to cough. Since patient has history of systolic heart failure with AICD closely monitor in telemetry for any arrhythmias. 3. Chest pain - cycle cardiac markers. 4. Metabolic acidosis - with lactic acidosis. Not sure what is causing it. Closely observe. Urine doesn't show any ketones to indicate became. Follow ABG and ketones. Closely follow metabolic panel. If there is further episodes of diarrhea then further workup for diarrhea. 5. Diabetes mellitus type 2 - on sliding-scale insulin since patient was found to be hypoglycemic last admission. 6. Hyperlipidemia - continue statins. 7. Recent admitted for stroke.   I have reviewed patient's old charts and labs.  Code Status: Full code.  Family Communication: Patient's daughter.  Disposition Plan: Admit to inpatient.    Avalina Benko N. [facility] Pager [phone].  If 7PM-7AM, please contact night-coverage [email or web address] Password [facility] 03/25/2013, 8:59 PM

## 2013-03-26 ENCOUNTER — Encounter (HOSPITAL_COMMUNITY): Payer: Self-pay | Admitting: General Practice

## 2013-03-26 DIAGNOSIS — I5021 Acute systolic (congestive) heart failure: Secondary | ICD-10-CM

## 2013-03-26 DIAGNOSIS — R7309 Other abnormal glucose: Secondary | ICD-10-CM

## 2013-03-26 LAB — COMPREHENSIVE METABOLIC PANEL
ALT: 48 U/L — AB (ref 0–35)
AST: 37 U/L (ref 0–37)
Albumin: 3.9 g/dL (ref 3.5–5.2)
Alkaline Phosphatase: 69 U/L (ref 39–117)
BUN: 28 mg/dL — ABNORMAL HIGH (ref 6–23)
CALCIUM: 10.4 mg/dL (ref 8.4–10.5)
CO2: 20 mEq/L (ref 19–32)
Chloride: 99 mEq/L (ref 96–112)
Creatinine, Ser: 1.09 mg/dL (ref 0.50–1.10)
GFR calc Af Amer: 57 mL/min — ABNORMAL LOW (ref >90)
GFR calc non Af Amer: 49 mL/min — ABNORMAL LOW (ref >90)
Glucose, Bld: 205 mg/dL — ABNORMAL HIGH (ref 70–99)
Potassium: 5.1 mEq/L (ref 3.7–5.3)
SODIUM: 135 meq/L — AB (ref 137–147)
TOTAL PROTEIN: 7.1 g/dL (ref 6.0–8.3)
Total Bilirubin: 0.6 mg/dL (ref 0.3–1.2)

## 2013-03-26 LAB — CBC WITH DIFFERENTIAL/PLATELET
Basophils Absolute: 0 10*3/uL (ref 0.0–0.1)
Basophils Relative: 0 % (ref 0–1)
Eosinophils Absolute: 0.1 10*3/uL (ref 0.0–0.7)
Eosinophils Relative: 2 % (ref 0–5)
HEMATOCRIT: 39 % (ref 36.0–46.0)
HEMOGLOBIN: 12.8 g/dL (ref 12.0–15.0)
LYMPHS PCT: 22 % (ref 12–46)
Lymphs Abs: 1.6 10*3/uL (ref 0.7–4.0)
MCH: 29 pg (ref 26.0–34.0)
MCHC: 32.8 g/dL (ref 30.0–36.0)
MCV: 88.2 fL (ref 78.0–100.0)
MONO ABS: 0.7 10*3/uL (ref 0.1–1.0)
MONOS PCT: 10 % (ref 3–12)
NEUTROS ABS: 4.8 10*3/uL (ref 1.7–7.7)
Neutrophils Relative %: 66 % (ref 43–77)
Platelets: 223 10*3/uL (ref 150–400)
RBC: 4.42 MIL/uL (ref 3.87–5.11)
RDW: 15.9 % — ABNORMAL HIGH (ref 11.5–15.5)
WBC: 7.2 10*3/uL (ref 4.0–10.5)

## 2013-03-26 LAB — GLUCOSE, CAPILLARY
GLUCOSE-CAPILLARY: 197 mg/dL — AB (ref 70–99)
GLUCOSE-CAPILLARY: 148 mg/dL — AB (ref 70–99)
Glucose-Capillary: 115 mg/dL — ABNORMAL HIGH (ref 70–99)
Glucose-Capillary: 192 mg/dL — ABNORMAL HIGH (ref 70–99)

## 2013-03-26 LAB — TSH: TSH: 4.257 u[IU]/mL (ref 0.350–4.500)

## 2013-03-26 LAB — KETONES, QUALITATIVE: ACETONE BLD: NEGATIVE

## 2013-03-26 LAB — TROPONIN I: Troponin I: <0.3 ng/mL (ref <0.30)

## 2013-03-26 NOTE — Progress Notes (Signed)
UR completed Deidra Spease K. Jnai Snellgrove, RN, BSN, MSHL, CCM  03/26/2013 2:44 PM

## 2013-03-26 NOTE — Consult Note (Signed)
Heart Failure Navigator Consult Note  Presentation: Paula [name] is a 73 y.o. female with history of chronic systolic heart failure status post AICD placement last EF 25%, hypertension, diabetes mellitus, hyperlipidemia who was admitted last month for CHF and stroke has been experiencing shortness of breath with cough over the last 4 days. Patient had come to the ER yesterday with cough and short of breath and was discharged home on antibiotics for possible laryngitis. Today patient was found to be lethargic and had a syncopal episode witnessed by patient's daughter. In the ER patient was found to be acutely short of breath with hypertension. Patient was given Lasix 80 mg IV and there has been admitted for further management. Patient states in addition the shortness of breath patient has been having chest pain left anterior chest wall pressure-like nonradiating. EKG shows sinus tachycardia with point Troponins being positive but regular troponin was negative. Patient also had one episode of diarrhea yesterday as per patient's daughter. Patient was having drenching sweats.   Past Medical History  Diagnosis Date  . Systolic CHF     Non ischemic, last Cath 09/21/08 - normal left main, LAD, LCx, ramus intermedius, RCA  . Hypertension   . Stroke   . Headache(784.0)   . Diabetes mellitus   . Dementia 05/27/2012  . Osteopenia     DEXA 2012 : T score hip -2.3, femur -2.1  . CHF (congestive heart failure)   . Shortness of breath   . Pacemaker   . Myocardial infarction 1991    [name]/notes 12/21/2001  . 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 Automatic implantable cardioverter-defibrillator in situ     History   Social History  . Marital Status: Single    Spouse Name: N/A    Number of Children: N/A  . Years of Education: N/A   Social History Main Topics  . Smoking status: Current Every Day Smoker    Types: Cigarettes  . Smokeless tobacco: Never Used  . Alcohol  Use: No  . Drug Use: No  . Sexual Activity: No   Other Topics Concern  . None   Social History Narrative  . None    ECHO:Study Conclusions- 02/10/13 - Left ventricle: The cavity size was severely dilated. Wall thickness was increased in a pattern of mild LVH. The estimated ejection fraction was 25%. Diffuse hypokinesis. - Aortic valve: Mild regurgitation. - Mitral valve: Moderate regurgitation. - Left atrium: The atrium was moderately dilated. - Right atrium: The atrium was mildly dilated. - Atrial septum: No defect or patent foramen ovale was identified. - Tricuspid valve: Moderate regurgitation. - Pulmonary arteries: PA peak pressure: 54mm Hg (S). - Impressions: Restrictive mitral filling with elevated EDP by E/E' ratio   BNP    Component Value Date/Time   PROBNP 7979.0* 03/25/2013 1730    Education Assessment and Provision:  Detailed education and instructions provided on heart failure disease management including the following:  Signs and symptoms of Heart Failure When to call the physician Importance of daily weights Low sodium diet  Fluid restriction Medication management Anticipated future follow-up appointments  Patient education given on each of the above topics.  Ms. Harle Stanfordicholson seemed to be somewhat confused as we talked and I question how much of the information she retained.   I called her daughter Aletta Edouardlisia and spoke at length with her regarding all of the above and managing her mother at home with her HF.  She verbalized understanding of all  topics discussed.  She says that they do not currently have a scale.  I will attempt to supply her with a scale before discharge. She also describes some difficulty getting medications secondary to cost.   Education Materials:  "Living Better With Heart Failure" Booklet, Daily Weight Tracker Tool and Heart Failure Educational Video.   High Risk Criteria for Readmission and/or Poor Patient Outcomes:   EF <30%- Yes  2  or more admissions in 6 months- Yes  Difficult social situation- No-(lives with daughter)  Demonstrates medication noncompliance- No   Barriers of Care:   Her confusion, financial constraints and daughter's understanding of medical condition and ability to assist her in management of it.  Discharge Planning:   Plans to discharge home with daughter.  Notes to Patient's Outpatient Care Team for Continued Management in the Community:  She needs close follow-up and daughter needs ongoing education and compliance reinforcement.

## 2013-03-26 NOTE — Progress Notes (Signed)
I cosign with Demarcus Steward on all assessments, medication administration and documentation for this shift. Mirriam Vadala A, RN       

## 2013-03-26 NOTE — Care Management Note (Addendum)
  Page 2 of 2   03/31/2013     3:00:57 PM   CARE MANAGEMENT NOTE 03/31/2013  Patient:  Paula [name], Paula [name]   Account Number:  [id number]  Date Initiated:  03/26/2013  Documentation initiated by:  Nyaira Hodgens  Subjective/Objective Assessment:   Admitted with SOB, acute respiratory failure     Action/Plan:   CM to follow for dispositon needs   Anticipated DC Date:  03/29/2013   Anticipated DC Plan:  HOME/SELF CARE  In-house referral  Clinical Social Worker      DC Planning Services  CM consult      Choice offered to / List presented to:             Status of service:  Completed, signed off Medicare Important Message given?   (If response is "NO", the following Medicare IM given date fields will be blank) Date Medicare IM given:   Date Additional Medicare IM given:    Discharge Disposition:  SKILLED NURSING FACILITY  Per UR Regulation:  Reviewed for med. necessity/level of care/duration of stay  If discussed at Long Length of Stay Meetings, dates discussed:   03/31/2013    Comments:  03/31/2013 Disposition:  SNF / [facility] Brelyn Woehl RN, BSN, MSHL, CCm 03/31/2013  03/30/2012 PT RECS:  SNF Assigned nurse has notified SW of PT update as patient was scheduled for d/c today.  Nurse confirms DTR does not elect to take patient home until rehab completed. Disposition Plan:  SNF Kynnedy Carreno, RN, BSN, [place], [place] 03/30/2013  03/26/2013 IOS: IV Lasix bid Donato Schultz RN, BSN, [place], [place] 03/26/2013

## 2013-03-26 NOTE — Progress Notes (Signed)
[facility]                                                                              Patient Demographics  Paula [name], is a 73 y.o. female, DOB - [birth year], [id number]  Admit date - 03/25/2013   Admitting Physician Eduard ClosArshad N Kakrakandy, MD  Outpatient Primary MD for the patient is [name],[name], MD  LOS - 1   Chief Complaint  Patient presents with  . Near Syncope        Assessment & Plan   Respiratory failure secondary to systolic CHF exacerbation -Will continue IV Lasix, daily weights, and strict input and output -Echocardiogram to 10:15 shows an EF of 25% -CXR: cardiomegaly, no active disease  Syncope -Likely secondary to coughing spell -Patient does have AICD in place, and anatomically to monitor for any arrhythmias.  Chest pain -Likely secondary to coughing spells. -Troponin is cycled and found to be negative -Currently chest pain-free -Continue home medications  Metabolic acidosis -Currently improved. ABG did not show acidosis. We'll continue to monitor.  Hypertension -Continue coreg, lisinopril  Diabetes mellitus -Continue insulin sliding scale with CBG monitoring. -Hemoglobin A1c 8.5  Hyperlipidemia -Continue Lovaza, fenofibrate, and Lipitor  History of stroke -Patient recently hospitalized for stroke. Will continue full dose aspirin.  Code Status: Full  Family Communication: None at bedside  Disposition Plan: Admitted  Time Spent in minutes   35 minutes  Procedures None  Consults  None  DVT Prophylaxis  Lovenox   Lab Results  Component Value Date   PLT 223 03/26/2013    Medications  Scheduled Meds: . aspirin EC  325 mg Oral Daily  . atorvastatin  40 mg Oral Daily  . carvedilol  3.125 mg Oral BID WC  . enoxaparin (LOVENOX) injection  40 mg Subcutaneous Q24H  . fenofibrate  160 mg Oral Daily  . furosemide  40 mg Intravenous Q12H  . insulin aspart  0-9 Units Subcutaneous TID WC  . isosorbide mononitrate  60  mg Oral Daily  . levofloxacin (LEVAQUIN) IV  750 mg Intravenous Q48H  . lisinopril  20 mg Oral Daily  . omega-3 acid ethyl esters  1 g Oral Daily  . pantoprazole  40 mg Oral Daily  . potassium chloride SA  20 mEq Oral Daily  . sodium chloride  3 mL Intravenous Q12H  . sodium chloride  3 mL Intravenous Q12H   Continuous Infusions:  PRN Meds:.acetaminophen, acetaminophen, hydrALAZINE, nitroGLYCERIN, ondansetron (ZOFRAN) IV, ondansetron, traMADol  Antibiotics    Anti-infectives   Start     Dose/Rate Route Frequency Ordered Stop   03/25/13 2300  levofloxacin (LEVAQUIN) IVPB 750 mg     750 mg 100 mL/hr over 90 Minutes Intravenous Every 48 hours 03/25/13 2300          Subjective:   Paula BeersAlice [name] seen and examined today.  Patient states she is feeling better today and has no complaints.  Objective:   Filed Vitals:   03/25/13 2147 03/25/13 2357 03/26/13 0220 03/26/13 0554  BP: 159/124 156/102 147/90 144/98  Pulse: 101  106 104  Temp: 98.4 F (36.9 C)  97.6 F (36.4 C) 97.8 F (36.6 C)  TempSrc: Oral  Oral Oral  Resp: 20  18 18   Height: 5\' 5"  (1.651 m)     Weight: 81.8 kg (180 lb 5.4 oz)   80.7 kg (177 lb 14.6 oz)  SpO2: 100%  100% 100%    Wt Readings from Last 3 Encounters:  03/26/13 80.7 kg (177 lb 14.6 oz)  02/14/13 82.6 kg (182 lb 1.6 oz)  02/07/13 82.1 kg (181 lb)     Intake/Output Summary (Last 24 hours) at 03/26/13 0758 Last data filed at 03/26/13 0701  Gross per 24 hour  Intake    271 ml  Output   3475 ml  Net  -3204 ml    Exam  General: Well developed, well nourished, NAD, appears stated age  HEENT: NCAT, PERRLA, EOMI, Anicteic Sclera, mucous membranes moist.   Neck: Supple, no JVD, no masses  Cardiovascular: S1 S2 auscultated, no rubs, murmurs or gallops. Regular rate and rhythm.  Respiratory: Clear to auscultation bilaterally with equal chest rise  Abdomen: Soft, nontender, nondistended, + bowel sounds  Extremities: warm dry without  cyanosis clubbing or edema  Neuro: AAOx3, cranial nerves grossly intact. Strength 5/5 in patient's upper and lower extremities bilaterally  Skin: Without rashes exudates or nodules  Psych: Normal affect and demeanor with intact judgement and insight  Data Review   Micro Results Recent Results (from the past 240 hour(s))  URINE CULTURE     Status: None   Collection Time    03/24/13 11:35 AM      Result Value Ref Range Status   Specimen Description URINE, CATHETERIZED   Final   Special Requests NONE   Final   Culture  Setup Time     Final   Value: 03/24/2013 18:05     Performed at [facility] Count     Final   Value: NO GROWTH     Performed at [facility]   Culture     Final   Value: NO GROWTH     Performed at [facility]   Report Status 03/25/2013 FINAL   Final    Radiology Reports Dg Chest 2 View  03/25/2013   CLINICAL DATA:  Near syncope.  Left chest pain.  EXAM: CHEST  2 VIEW  COMPARISON:  03/24/2013  FINDINGS: Left pacer/AICD remains in place, unchanged. Cardiomegaly. Lungs are clear. No effusions. No acute bony abnormality.  IMPRESSION: Cardiomegaly.  No active disease.   Electronically Signed   By: Charlett Nose M.D.   On: 03/25/2013 18:20   Dg Chest 2 View  03/24/2013   CLINICAL DATA:  Cough and shortness of breath.  Weakness.  EXAM: CHEST  2 VIEW  COMPARISON:  02/09/2013  FINDINGS: AICD in place. Chronic cardiomegaly. Slight pulmonary vascular prominence, unchanged. No acute infiltrates or effusions. Slight scarring at the lung bases posteriorly.  IMPRESSION: No change since the prior study. Chronic cardiomegaly and slight pulmonary vascular prominence.   Electronically Signed   By: Geanie Cooley M.D.   On: 03/24/2013 10:38    CBC  Recent Labs Lab 03/24/13 1005 03/25/13 1856 03/26/13 0236  WBC 5.9 6.6 7.2  HGB 12.4 11.8* 12.8  HCT 38.3 35.9* 39.0  PLT 223 227 223  MCV 89.1 88.0 88.2  MCH 28.8 28.9 29.0  MCHC 32.4 32.9 32.8    RDW 15.9* 15.8* 15.9*  LYMPHSABS 1.4 1.9 1.6  MONOABS 0.7 0.6 0.7  EOSABS 0.1 0.1 0.1  BASOSABS 0.0 0.0 0.0    Chemistries   Recent Labs  Lab 03/24/13 1005 03/25/13 1730 03/26/13 0236  NA 137 134* 135*  K 3.9 4.4 5.1  CL 102 97 99  CO2 20 18* 20  GLUCOSE 208* 192* 205*  BUN 22 32* 28*  CREATININE 1.06 1.09 1.09  CALCIUM 10.2 10.9* 10.4  AST  --  30 37  ALT  --  43* 48*  ALKPHOS  --  72 69  BILITOT  --  0.6 0.6   ------------------------------------------------------------------------------------------------------------------ estimated creatinine clearance is 48.3 ml/min (by C-G formula based on Cr of 1.09). ------------------------------------------------------------------------------------------------------------------ No results found for this basename: HGBA1C,  in the last 72 hours ------------------------------------------------------------------------------------------------------------------ No results found for this basename: CHOL, HDL, LDLCALC, TRIG, CHOLHDL, LDLDIRECT,  in the last 72 hours ------------------------------------------------------------------------------------------------------------------ No results found for this basename: TSH, T4TOTAL, FREET3, T3FREE, THYROIDAB,  in the last 72 hours ------------------------------------------------------------------------------------------------------------------ No results found for this basename: VITAMINB12, FOLATE, FERRITIN, TIBC, IRON, RETICCTPCT,  in the last 72 hours  Coagulation profile No results found for this basename: INR, PROTIME,  in the last 168 hours  No results found for this basename: DDIMER,  in the last 72 hours  Cardiac Enzymes  Recent Labs Lab 03/24/13 1005 03/25/13 1856 03/26/13 0236  TROPONINI <0.30 <0.30 <0.30   ------------------------------------------------------------------------------------------------------------------ No components found with this basename: POCBNP,      Lamberto Dinapoli D.O. on 03/26/2013 at 7:58 AM  Between 7am to 7pm - Pager - [phone]  After 7pm go to [email or web address] - password [email or web address]  And look for the night coverage person covering for me after hours  [facility] Office  [phone]

## 2013-03-26 NOTE — ED Provider Notes (Signed)
Medical screening examination/treatment/procedure(s) were conducted as a shared visit with resident-physician practitioner(s) and myself.  I personally evaluated the patient during the encounter.  Pt is a 73 y.o. female with pmhx as above presenting with report of SOB, cough, near syncope and chest pain, though pt denies this but is poor historian due to dementia.  Pt found to have recurrent episodes of diaphoresis and dec LOC in ED, bu t continued to deny CP. On PE, pt is tachycardic, mild crackles at bases, hypoxic on RA, 2+ BLLE edema, diaphoretic, was also more hypertensive than initially (200 systolic).  Pt given 3x SL NTG, 40mg  IV lasix, for suspected CHF, though feel she also requires admission to tele given possibility of arrythmias given episodic diaphoresis and dec LOC. [facility] will admit.    Date: 03/25/2013  Rate: 111  Rhythm: sinus tachycardia  QRS Axis: left  Intervals: QRS prolonged  ST/T Wave abnormalities: nonspecific ST/T changes  Conduction Disutrbances:left bundle branch block  Narrative Interpretation:   Old EKG Reviewed: changes noted, TWI resolved V5, V6    Shanna Cisco, MD 03/26/13 1235

## 2013-03-27 DIAGNOSIS — G9340 Encephalopathy, unspecified: Secondary | ICD-10-CM

## 2013-03-27 LAB — GLUCOSE, CAPILLARY
GLUCOSE-CAPILLARY: 194 mg/dL — AB (ref 70–99)
Glucose-Capillary: 167 mg/dL — ABNORMAL HIGH (ref 70–99)
Glucose-Capillary: 190 mg/dL — ABNORMAL HIGH (ref 70–99)
Glucose-Capillary: 155 mg/dL — ABNORMAL HIGH (ref 70–99)

## 2013-03-27 MED ORDER — HYDROCOD POLST-CHLORPHEN POLST 10-8 MG/5ML PO LQCR
5.0000 mL | Freq: Once | ORAL | Status: AC
  Administered 2013-03-27: 5 mL via ORAL
  Filled 2013-03-27: qty 5

## 2013-03-27 MED ORDER — MENTHOL 3 MG MT LOZG
1.0000 | LOZENGE | OROMUCOSAL | Status: DC | PRN
  Filled 2013-03-27: qty 9

## 2013-03-27 NOTE — Progress Notes (Signed)
Pt. C/o cough this am. On call NP, K. Schorr, made aware. New orders received. RN will implement as ordered. Angellee Cohill, Cheryll Dessert

## 2013-03-27 NOTE — Progress Notes (Signed)
Report given to receiving RN. Patient in bed sleeping. No signs of distress.

## 2013-03-27 NOTE — Progress Notes (Signed)
[facility]                                                                              Patient Demographics  Paula [name], is a 73 y.o. female, DOB - [birth year], ZOX:[id number]  Admit date - 03/25/2013   Admitting Physician Eduard Clos, MD  Outpatient Primary MD for the patient is [name], MD  LOS - 2   Chief Complaint  Patient presents with  . Near Syncope        Assessment & Plan   Respiratory failure secondary to systolic CHF exacerbation -Will continue IV Lasix, daily weights, and strict input and output -Echocardiogram to 02/10/13 shows an EF of 25% -CXR: cardiomegaly, no active disease  Syncope -Likely secondary to coughing spell -Patient does have AICD in place, and anatomically to monitor for any arrhythmias.  Chest pain -Likely secondary to coughing spells. -Troponin is cycled and found to be negative -Currently chest pain-free -Continue home medications  Metabolic acidosis -Currently improved. ABG did not show acidosis. We'll continue to monitor.  Hypertension -Continue coreg, lisinopril  Diabetes mellitus -Continue insulin sliding scale with CBG monitoring. -Hemoglobin A1c 8.5  Hyperlipidemia -Continue Lovaza, fenofibrate, and Lipitor  History of stroke -Patient recently hospitalized for stroke. Will continue full dose aspirin.  Sore throat -Will order cepacol   Code Status: Full  Family Communication: None at bedside  Disposition Plan: Admitted  Time Spent in minutes   25 minutes  Procedures None  Consults  None  DVT Prophylaxis  Lovenox   Lab Results  Component Value Date   PLT 223 03/26/2013    Medications  Scheduled Meds: . aspirin EC  325 mg Oral Daily  . atorvastatin  40 mg Oral Daily  . carvedilol  3.125 mg Oral BID WC  . enoxaparin (LOVENOX) injection  40 mg Subcutaneous Q24H  . fenofibrate  160 mg Oral Daily  . furosemide  40 mg Intravenous Q12H  . insulin aspart  0-9 Units  Subcutaneous TID WC  . isosorbide mononitrate  60 mg Oral Daily  . levofloxacin (LEVAQUIN) IV  750 mg Intravenous Q48H  . lisinopril  20 mg Oral Daily  . omega-3 acid ethyl esters  1 g Oral Daily  . pantoprazole  40 mg Oral Daily  . potassium chloride SA  20 mEq Oral Daily  . sodium chloride  3 mL Intravenous Q12H  . sodium chloride  3 mL Intravenous Q12H   Continuous Infusions:  PRN Meds:.acetaminophen, acetaminophen, hydrALAZINE, nitroGLYCERIN, ondansetron (ZOFRAN) IV, ondansetron, traMADol  Antibiotics    Anti-infectives   Start     Dose/Rate Route Frequency Ordered Stop   03/25/13 2300  levofloxacin (LEVAQUIN) IVPB 750 mg     750 mg 100 mL/hr over 90 Minutes Intravenous Every 48 hours 03/25/13 2300          Subjective:   Paula [name] seen and examined today.  Patient complains of a sore throat and states she feels she has a cold.    Objective:   Filed Vitals:   03/26/13 1900 03/26/13 2045 03/27/13 0213 03/27/13 0544  BP: 140/70 134/90 143/60 133/85  Pulse: 90 58 94   Temp: 97.1 F (36.2 C)  97.7 F (36.5 C) 98.1 F (36.7 C) 97.7 F (36.5 C)  TempSrc: Oral Oral Oral Oral  Resp: 18 18 18 18   Height:      Weight:    78.92 kg (173 lb 15.8 oz)  SpO2: 100% 100% 100% 99%    Wt Readings from Last 3 Encounters:  03/27/13 78.92 kg (173 lb 15.8 oz)  02/14/13 82.6 kg (182 lb 1.6 oz)  02/07/13 82.1 kg (181 lb)     Intake/Output Summary (Last 24 hours) at 03/27/13 0737 Last data filed at 03/27/13 0600  Gross per 24 hour  Intake   1180 ml  Output    425 ml  Net    755 ml    Exam  General: Well developed, well nourished, NAD, appears stated age  HEENT: NCAT, PERRLA, EOMI, Anicteic Sclera, mucous membranes moist.   Neck: Supple, no masses  Cardiovascular: S1 S2 auscultated, no rubs, murmurs or gallops. Regular rate and rhythm.  Respiratory: Clear to auscultation bilaterally with equal chest rise  Abdomen: Soft, nontender, nondistended, + bowel  sounds  Extremities: warm dry without cyanosis clubbing or edema  Neuro: AAOx3, cranial nerves grossly intact.   Skin: Without rashes exudates or nodules  Psych: Normal affect and demeanor with intact judgement and insight  Data Review   Micro Results Recent Results (from the past 240 hour(s))  URINE CULTURE     Status: None   Collection Time    03/24/13 11:35 AM      Result Value Ref Range Status   Specimen Description URINE, CATHETERIZED   Final   Special Requests NONE   Final   Culture  Setup Time     Final   Value: 03/24/2013 18:05     Performed at [facility] Count     Final   Value: NO GROWTH     Performed at [facility]   Culture     Final   Value: NO GROWTH     Performed at [facility]   Report Status 03/25/2013 FINAL   Final    Radiology Reports Dg Chest 2 View  03/25/2013   CLINICAL DATA:  Near syncope.  Left chest pain.  EXAM: CHEST  2 VIEW  COMPARISON:  03/24/2013  FINDINGS: Left pacer/AICD remains in place, unchanged. Cardiomegaly. Lungs are clear. No effusions. No acute bony abnormality.  IMPRESSION: Cardiomegaly.  No active disease.   Electronically Signed   By: Charlett Nose M.D.   On: 03/25/2013 18:20   Dg Chest 2 View  03/24/2013   CLINICAL DATA:  Cough and shortness of breath.  Weakness.  EXAM: CHEST  2 VIEW  COMPARISON:  02/09/2013  FINDINGS: AICD in place. Chronic cardiomegaly. Slight pulmonary vascular prominence, unchanged. No acute infiltrates or effusions. Slight scarring at the lung bases posteriorly.  IMPRESSION: No change since the prior study. Chronic cardiomegaly and slight pulmonary vascular prominence.   Electronically Signed   By: Geanie Cooley M.D.   On: 03/24/2013 10:38    CBC  Recent Labs Lab 03/24/13 1005 03/25/13 1856 03/26/13 0236  WBC 5.9 6.6 7.2  HGB 12.4 11.8* 12.8  HCT 38.3 35.9* 39.0  PLT 223 227 223  MCV 89.1 88.0 88.2  MCH 28.8 28.9 29.0  MCHC 32.4 32.9 32.8  RDW 15.9* 15.8* 15.9*   LYMPHSABS 1.4 1.9 1.6  MONOABS 0.7 0.6 0.7  EOSABS 0.1 0.1 0.1  BASOSABS 0.0 0.0 0.0    Chemistries   Recent Labs Lab  03/24/13 1005 03/25/13 1730 03/26/13 0236  NA 137 134* 135*  K 3.9 4.4 5.1  CL 102 97 99  CO2 20 18* 20  GLUCOSE 208* 192* 205*  BUN 22 32* 28*  CREATININE 1.06 1.09 1.09  CALCIUM 10.2 10.9* 10.4  AST  --  30 37  ALT  --  43* 48*  ALKPHOS  --  72 69  BILITOT  --  0.6 0.6   ------------------------------------------------------------------------------------------------------------------ estimated creatinine clearance is 47.7 ml/min (by C-G formula based on Cr of 1.09). ------------------------------------------------------------------------------------------------------------------ No results found for this basename: HGBA1C,  in the last 72 hours ------------------------------------------------------------------------------------------------------------------ No results found for this basename: CHOL, HDL, LDLCALC, TRIG, CHOLHDL, LDLDIRECT,  in the last 72 hours ------------------------------------------------------------------------------------------------------------------  Recent Labs  03/26/13 0236  TSH 4.257   ------------------------------------------------------------------------------------------------------------------ No results found for this basename: VITAMINB12, FOLATE, FERRITIN, TIBC, IRON, RETICCTPCT,  in the last 72 hours  Coagulation profile No results found for this basename: INR, PROTIME,  in the last 168 hours  No results found for this basename: DDIMER,  in the last 72 hours  Cardiac Enzymes  Recent Labs Lab 03/25/13 1856 03/26/13 0236 03/26/13 0908  TROPONINI <0.30 <0.30 <0.30   ------------------------------------------------------------------------------------------------------------------ No components found with this basename: POCBNP,     Francia Verry D.O. on 03/27/2013 at 7:37 AM  Between 7am to 7pm - Pager -  [phone]  After 7pm go to [email or web address] - password [email or web address]  And look for the night coverage person covering for me after hours  [facility] Office  [phone]

## 2013-03-28 DIAGNOSIS — N179 Acute kidney failure, unspecified: Secondary | ICD-10-CM

## 2013-03-28 LAB — CBC
HCT: 35.7 % — ABNORMAL LOW (ref 36.0–46.0)
Hemoglobin: 11.5 g/dL — ABNORMAL LOW (ref 12.0–15.0)
MCH: 28.5 pg (ref 26.0–34.0)
MCHC: 32.2 g/dL (ref 30.0–36.0)
MCV: 88.4 fL (ref 78.0–100.0)
PLATELETS: 221 10*3/uL (ref 150–400)
RBC: 4.04 MIL/uL (ref 3.87–5.11)
RDW: 16 % — AB (ref 11.5–15.5)
WBC: 5 10*3/uL (ref 4.0–10.5)

## 2013-03-28 LAB — BASIC METABOLIC PANEL
BUN: 32 mg/dL — ABNORMAL HIGH (ref 6–23)
CALCIUM: 9.7 mg/dL (ref 8.4–10.5)
CO2: 22 mEq/L (ref 19–32)
CREATININE: 1.22 mg/dL — AB (ref 0.50–1.10)
Chloride: 96 mEq/L (ref 96–112)
GFR calc non Af Amer: 43 mL/min — ABNORMAL LOW (ref >90)
GFR, EST AFRICAN AMERICAN: 50 mL/min — AB (ref >90)
Glucose, Bld: 170 mg/dL — ABNORMAL HIGH (ref 70–99)
Potassium: 4.7 mEq/L (ref 3.7–5.3)
Sodium: 132 mEq/L — ABNORMAL LOW (ref 137–147)

## 2013-03-28 LAB — GLUCOSE, CAPILLARY
GLUCOSE-CAPILLARY: 168 mg/dL — AB (ref 70–99)
Glucose-Capillary: 128 mg/dL — ABNORMAL HIGH (ref 70–99)
Glucose-Capillary: 193 mg/dL — ABNORMAL HIGH (ref 70–99)
Glucose-Capillary: 147 mg/dL — ABNORMAL HIGH (ref 70–99)

## 2013-03-28 MED ORDER — LEVOFLOXACIN 750 MG PO TABS
750.0000 mg | ORAL_TABLET | ORAL | Status: DC
  Administered 2013-03-29: 750 mg via ORAL
  Filled 2013-03-28 (×2): qty 1

## 2013-03-28 NOTE — Progress Notes (Signed)
[facility]                                                                              Patient Demographics  Paula [name], is a 73 y.o. female, DOB - May 06, 1940, AVW:[id number]  Admit date - 03/25/2013   Admitting Physician Eduard Clos, MD  Outpatient Primary MD for the patient is [name], MD  LOS - 3   Chief Complaint  Patient presents with  . Near Syncope        Assessment & Plan   Respiratory failure secondary to systolic CHF exacerbation -Will continue IV Lasix, daily weights, and strict input and output -Echocardiogram to 02/10/13 shows an EF of 25% -CXR: cardiomegaly, no active disease -Down 3kg since admission  Syncope -Likely secondary to coughing spell -Patient does have AICD in place, and anatomically to monitor for any arrhythmias.  Chest pain -Likely secondary to coughing spells. -Troponin is cycled and found to be negative -Currently chest pain-free -Continue home medications  Metabolic acidosis -Currently improved. ABG did not show acidosis. We'll continue to monitor.  Hypertension -Continue coreg, lisinopril  Diabetes mellitus -Continue insulin sliding scale with CBG monitoring. -Hemoglobin A1c 8.5  Hyperlipidemia -Continue Lovaza, fenofibrate, and Lipitor  History of stroke -Patient recently hospitalized for stroke. Will continue full dose aspirin.  Sore throat -Continue cepacol as needed  Code Status: Full  Family Communication: None at bedside  Disposition Plan: Admitted  Time Spent in minutes   20 minutes  Procedures None  Consults  None  DVT Prophylaxis  Lovenox   Lab Results  Component Value Date   PLT 221 03/28/2013    Medications  Scheduled Meds: . aspirin EC  325 mg Oral Daily  . atorvastatin  40 mg Oral Daily  . carvedilol  3.125 mg Oral BID WC  . enoxaparin (LOVENOX) injection  40 mg Subcutaneous Q24H  . fenofibrate  160 mg Oral Daily  . furosemide  40 mg Intravenous Q12H  .  insulin aspart  0-9 Units Subcutaneous TID WC  . isosorbide mononitrate  60 mg Oral Daily  . levofloxacin (LEVAQUIN) IV  750 mg Intravenous Q48H  . lisinopril  20 mg Oral Daily  . omega-3 acid ethyl esters  1 g Oral Daily  . pantoprazole  40 mg Oral Daily  . sodium chloride  3 mL Intravenous Q12H  . sodium chloride  3 mL Intravenous Q12H   Continuous Infusions:  PRN Meds:.acetaminophen, acetaminophen, hydrALAZINE, menthol-cetylpyridinium, nitroGLYCERIN, ondansetron (ZOFRAN) IV, ondansetron, traMADol  Antibiotics    Anti-infectives   Start     Dose/Rate Route Frequency Ordered Stop   03/25/13 2300  levofloxacin (LEVAQUIN) IVPB 750 mg     750 mg 100 mL/hr over 90 Minutes Intravenous Every 48 hours 03/25/13 2300          Subjective:   Paula [name] seen and examined today.  Patient no longer complains of sore throat.  She states she is feeling better and her breathing has improved.  She has no complaints this morning.  Objective:   Filed Vitals:   03/27/13 1300 03/27/13 1657 03/27/13 2139 03/28/13 0540  BP: 130/89 132/84 122/76 117/69  Pulse: 77 82 76 84  Temp: 97.1 F (36.2 C)  98 F (36.7 C) 98 F (36.7 C)  TempSrc: Oral  Oral Oral  Resp: 18  18 18   Height:      Weight:    78.2 kg (172 lb 6.4 oz)  SpO2: 94%  99% 97%    Wt Readings from Last 3 Encounters:  03/28/13 78.2 kg (172 lb 6.4 oz)  02/14/13 82.6 kg (182 lb 1.6 oz)  02/07/13 82.1 kg (181 lb)     Intake/Output Summary (Last 24 hours) at 03/28/13 0725 Last data filed at 03/28/13 [id number]  Gross per 24 hour  Intake   1470 ml  Output   1225 ml  Net    245 ml    Exam  General: Well developed, well nourished, NAD, appears stated age  HEENT: NCAT, mucous membranes moist.   Neck: Supple, no masses  Cardiovascular: S1 S2 auscultated, no rubs, murmurs or gallops. Regular rate and rhythm.  Respiratory: Clear to auscultation bilaterally with equal chest rise  Abdomen: Soft, nontender, nondistended, +  bowel sounds  Extremities: warm dry without cyanosis clubbing or edema  Neuro: AAOx3, cranial nerves grossly intact.   Skin: Without rashes exudates or nodules  Psych: Normal affect and demeanor with intact judgement and insight  Data Review   Micro Results Recent Results (from the past 240 hour(s))  URINE CULTURE     Status: None   Collection Time    03/24/13 11:35 AM      Result Value Ref Range Status   Specimen Description URINE, CATHETERIZED   Final   Special Requests NONE   Final   Culture  Setup Time     Final   Value: 03/24/2013 18:05     [facility]formed [facility]   [place] Count     Final   Value: NO GROWTH     [facility]formed [facility]   Culture     Final   Value: NO GROWTH     [facility]formed at [facility]   Report Status 03/25/2013 FINAL   Final    Radiology Reports Dg Chest 2 View  03/25/2013   CLINICAL DATA:  Near syncope.  Left chest pain.  EXAM: CHEST  2 VIEW  COMPARISON:  03/24/2013  FINDINGS: Left pacer/AICD remains in place, unchanged. Cardiomegaly. Lungs are clear. No effusions. No acute bony abnormality.  IMPRESSION: Cardiomegaly.  No active disease.   Electronically Signed   By: Charlett NoseKevin  Dover M.D.   On: 03/25/2013 18:20   Dg Chest 2 View  03/24/2013   CLINICAL DATA:  Cough and shortness of breath.  Weakness.  EXAM: CHEST  2 VIEW  COMPARISON:  02/09/2013  FINDINGS: AICD in place. Chronic cardiomegaly. Slight pulmonary vascular prominence, unchanged. No acute infiltrates or effusions. Slight scarring at the lung bases posteriorly.  IMPRESSION: No change since the prior study. Chronic cardiomegaly and slight pulmonary vascular prominence.   Electronically Signed   By: Geanie CooleyJim  Maxwell M.D.   On: 03/24/2013 10:38    CBC  Recent Labs Lab 03/24/13 1005 03/25/13 1856 03/26/13 0236 03/28/13 0431  WBC 5.9 6.6 7.2 5.0  HGB 12.4 11.8* 12.8 11.5*  HCT 38.3 35.9* 39.0 35.7*  PLT 223 227 223 221  MCV 89.1 88.0 88.2 88.4  MCH 28.8 28.9 29.0  28.5  MCHC 32.4 32.9 32.8 32.2  RDW 15.9* 15.8* 15.9* 16.0*  LYMPHSABS 1.4 1.9 1.6  --   MONOABS 0.7 0.6 0.7  --   EOSABS 0.1 0.1 0.1  --  BASOSABS 0.0 0.0 0.0  --     Chemistries   Recent Labs Lab 03/24/13 1005 03/25/13 1730 03/26/13 0236 03/28/13 0431  NA 137 134* 135* 132*  K 3.9 4.4 5.1 4.7  CL 102 97 99 96  CO2 20 18* 20 22  GLUCOSE 208* 192* 205* 170*  BUN 22 32* 28* 32*  CREATININE 1.06 1.09 1.09 1.22*  CALCIUM 10.2 10.9* 10.4 9.7  AST  --  30 37  --   ALT  --  43* 48*  --   ALKPHOS  --  72 69  --   BILITOT  --  0.6 0.6  --    ------------------------------------------------------------------------------------------------------------------ estimated creatinine clearance is 42.5 ml/min (by C-G formula based on Cr of 1.22). ------------------------------------------------------------------------------------------------------------------ No results found for this basename: HGBA1C,  in the last 72 hours ------------------------------------------------------------------------------------------------------------------ No results found for this basename: CHOL, HDL, LDLCALC, TRIG, CHOLHDL, LDLDIRECT,  in the last 72 hours ------------------------------------------------------------------------------------------------------------------  Recent Labs  03/26/13 0236  TSH 4.257   ------------------------------------------------------------------------------------------------------------------ No results found for this basename: VITAMINB12, FOLATE, FERRITIN, TIBC, IRON, RETICCTPCT,  in the last 72 hours  Coagulation profile No results found for this basename: INR, PROTIME,  in the last 168 hours  No results found for this basename: DDIMER,  in the last 72 hours  Cardiac Enzymes  Recent Labs Lab 03/25/13 1856 03/26/13 0236 03/26/13 0908  TROPONINI <0.30 <0.30 <0.30    ------------------------------------------------------------------------------------------------------------------ No components found with this basename: POCBNP,     Terrion Poblano D.O. on 03/28/2013 at 7:25 AM  Between 7am to 7pm - Pager - [phone]  After 7pm go to [email or web address] - password [email or web address]  And look for the night coverage person covering for me after hours  [facility] Office  [phone]

## 2013-03-28 NOTE — Progress Notes (Signed)
ANTIBIOTIC CONSULT NOTE - Follow-Up  Pharmacy Consult for levaquin Indication: bronchitis  No Known Allergies  Patient Measurements: Height: 5\' 5"  (165.1 cm) Weight: 172 lb 6.4 oz (78.2 kg) IBW/kg (Calculated) : 57   Vital Signs: Temp: 98 F (36.7 C) (03/28 0540) Temp src: Oral (03/28 0540) BP: 117/69 mmHg (03/28 0540) Pulse Rate: 84 (03/28 0540) Intake/Output from previous day: 03/27 0701 - 03/28 0700 In: 1470 [P.O.:1320; IV Piggyback:150] Out: 1225 [Urine:1225] Intake/Output from this shift:    Labs:  Recent Labs  03/25/13 1730 03/25/13 1856 03/26/13 0236 03/28/13 0431  WBC  --  6.6 7.2 5.0  HGB  --  11.8* 12.8 11.5*  PLT  --  227 223 221  CREATININE 1.09  --  1.09 1.22*   Estimated Creatinine Clearance: 42.5 ml/min (by C-G formula based on Cr of 1.22). No results found for this basename: Rolm Gala, VANCORANDOM, GENTTROUGH, GENTPEAK, GENTRANDOM, TOBRATROUGH, TOBRAPEAK, TOBRARND, AMIKACINPEAK, AMIKACINTROU, AMIKACIN,  in the last 72 hours   Microbiology: Recent Results (from the past 720 hour(s))  URINE CULTURE     Status: None   Collection Time    03/24/13 11:35 AM      Result Value Ref Range Status   Specimen Description URINE, CATHETERIZED   Final   Special Requests NONE   Final   Culture  Setup Time     Final   Value: 03/24/2013 18:05     Performed at [facility] Count     Final   Value: NO GROWTH     Performed at [facility]   Culture     Final   Value: NO GROWTH     Performed at [facility]   Report Status 03/25/2013 FINAL   Final    Medical History: Past Medical History  Diagnosis Date  . Systolic CHF     Non ischemic, last Cath 09/21/08 - normal left main, LAD, LCx, ramus intermedius, RCA  . Hypertension   . Stroke   . Headache(784.0)   . Diabetes mellitus   . Dementia 05/27/2012  . Osteopenia     DEXA 2012 : T score hip -2.3, femur -2.1  . CHF (congestive heart failure)   . Shortness of  breath   . Pacemaker   . Myocardial infarction 1991    [name] 12/21/2001  . 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 Automatic implantable cardioverter-defibrillator in situ     Medications:  Prescriptions prior to admission  Medication Sig Dispense Refill  . alendronate (FOSAMAX) 70 MG tablet Take 70 mg by mouth every Wednesday. Take with a full glass of water on an empty stomach.      Marland Kitchen aspirin 325 MG tablet Take 325 mg by mouth daily.      Marland Kitchen atorvastatin (LIPITOR) 40 MG tablet Take 40 mg by mouth daily.      Marland Kitchen azithromycin (ZITHROMAX Z-PAK) 250 MG tablet Take 1 tablet (250 mg total) by mouth daily. As directed  6 tablet  0  . carvedilol (COREG) 3.125 MG tablet Take 1 tablet (3.125 mg total) by mouth 2 (two) times daily with a meal.  28 tablet  0  . furosemide (LASIX) 40 MG tablet Take 1 tablet (40 mg total) by mouth 2 (two) times daily.  62 tablet  0  . insulin aspart (NOVOLOG) 100 UNIT/ML injection Inject 3 Units into the skin 3 (three) times daily before meals. If cbg over 150      .  insulin detemir (LEVEMIR) 100 UNIT/ML injection Inject 100 Units into the skin daily.      Marland Kitchen. lisinopril (PRINIVIL,ZESTRIL) 40 MG tablet Take 40 mg by mouth daily.      . nitroGLYCERIN (NITROSTAT) 0.4 MG SL tablet Place 1 tablet (0.4 mg total) under the tongue every 5 (five) minutes as needed for chest pain.  15 tablet  0  . [DISCONTINUED] insulin aspart (NOVOLOG) 100 UNIT/ML injection Inject 3 Units into the skin 3 (three) times daily before meals.  1 vial  0   Assessment: 73 yo lady on levaquin for bronchitis.  She is afebrile and WBC 5.    CrCl ~42 ml/min.  No cultures.  Eating a PO diet.  Goal of Therapy:  Eradication of infection  Plan:  Levaquin 750 mg po q48 hours. Will change to po today, and pharmacy will sign-off.  Please contact with questions.  Thanks!  Thanks for allowing pharmacy to be a part of this patient's care.  Tad MooreJessica Alisen Marsiglia, Pharm D, BCPS   Clinical Pharmacist Pager [phone]  03/28/2013 9:31 AM

## 2013-03-28 NOTE — Progress Notes (Signed)
Report given to receiving RN. Patient in bed sleeping no signs of distress noted.

## 2013-03-29 ENCOUNTER — Encounter (HOSPITAL_COMMUNITY): Payer: Self-pay | Admitting: *Deleted

## 2013-03-29 LAB — GLUCOSE, CAPILLARY
GLUCOSE-CAPILLARY: 138 mg/dL — AB (ref 70–99)
GLUCOSE-CAPILLARY: 147 mg/dL — AB (ref 70–99)
Glucose-Capillary: 155 mg/dL — ABNORMAL HIGH (ref 70–99)
Glucose-Capillary: 169 mg/dL — ABNORMAL HIGH (ref 70–99)

## 2013-03-29 LAB — BASIC METABOLIC PANEL
BUN: 34 mg/dL — AB (ref 6–23)
CHLORIDE: 97 meq/L (ref 96–112)
CO2: 23 meq/L (ref 19–32)
Calcium: 10.2 mg/dL (ref 8.4–10.5)
Creatinine, Ser: 1.58 mg/dL — ABNORMAL HIGH (ref 0.50–1.10)
GFR calc Af Amer: 36 mL/min — ABNORMAL LOW (ref >90)
GFR calc non Af Amer: 31 mL/min — ABNORMAL LOW (ref >90)
Glucose, Bld: 137 mg/dL — ABNORMAL HIGH (ref 70–99)
Potassium: 4.7 mEq/L (ref 3.7–5.3)
Sodium: 133 mEq/L — ABNORMAL LOW (ref 137–147)

## 2013-03-29 MED ORDER — SODIUM CHLORIDE 0.9 % IV SOLN
INTRAVENOUS | Status: AC
  Administered 2013-03-29: 1000 mL via INTRAVENOUS

## 2013-03-29 NOTE — Progress Notes (Signed)
[facility]                                                                              Patient Demographics  Paula [name], is a 73 y.o. female, DOB - 12-13-1940, JXB:[id number]  Admit date - 03/25/2013   Admitting Physician Eduard ClosArshad N Kakrakandy, MD  Outpatient Primary MD for the patient is [name],[name], MD  LOS - 4   Chief Complaint  Patient presents with  . Near Syncope        Assessment & Plan   Respiratory failure secondary to systolic CHF exacerbation -Will continue IV Lasix, daily weights, and strict input and output -Echocardiogram to 02/10/13 shows an EF of 25% -CXR: cardiomegaly, no active disease -Down 3kg since admission  Syncope -Likely secondary to coughing spell -Patient does have AICD in place, and anatomically to monitor for any arrhythmias.  Acute kidney injury -Secondary to diuresis -Will gently give IVF and continue to monitor BMP -Baseline creatinine 1, currently 1.58  Chest pain -Likely secondary to coughing spells. -Troponin is cycled and found to be negative -Currently chest pain-free -Continue home medications  Metabolic acidosis -Currently improved. ABG did not show acidosis. We'll continue to monitor.  Hypertension -Continue coreg, lisinopril  Diabetes mellitus -Continue insulin sliding scale with CBG monitoring. -Hemoglobin A1c 8.5  Hyperlipidemia -Continue Lovaza, fenofibrate, and Lipitor  History of stroke -Patient recently hospitalized for stroke. Will continue full dose aspirin.  Sore throat -Continue cepacol as needed  Code Status: Full  Family Communication: None at bedside, attempted to contact daughter, left message  Disposition Plan: Admitted  Time Spent in minutes   20 minutes  Procedures None  Consults  None  DVT Prophylaxis  Lovenox   Lab Results  Component Value Date   PLT 221 03/28/2013    Medications  Scheduled Meds: . aspirin EC  325 mg Oral Daily  . atorvastatin  40 mg Oral  Daily  . carvedilol  3.125 mg Oral BID WC  . enoxaparin (LOVENOX) injection  40 mg Subcutaneous Q24H  . fenofibrate  160 mg Oral Daily  . insulin aspart  0-9 Units Subcutaneous TID WC  . isosorbide mononitrate  60 mg Oral Daily  . levofloxacin  750 mg Oral Q48H  . lisinopril  20 mg Oral Daily  . omega-3 acid ethyl esters  1 g Oral Daily  . pantoprazole  40 mg Oral Daily  . sodium chloride  3 mL Intravenous Q12H  . sodium chloride  3 mL Intravenous Q12H   Continuous Infusions: . sodium chloride     PRN Meds:.acetaminophen, acetaminophen, hydrALAZINE, menthol-cetylpyridinium, nitroGLYCERIN, ondansetron (ZOFRAN) IV, ondansetron, traMADol  Antibiotics    Anti-infectives   Start     Dose/Rate Route Frequency Ordered Stop   03/29/13 2200  levofloxacin (LEVAQUIN) tablet 750 mg     750 mg Oral Every 48 hours 03/28/13 0936     03/25/13 2300  levofloxacin (LEVAQUIN) IVPB 750 mg  Status:  Discontinued     750 mg 100 mL/hr over 90 Minutes Intravenous Every 48 hours 03/25/13 2300 03/28/13 0935        Subjective:   Paula BeersAlice [name] seen and examined today.  Patient has no complaints  today.  She states all her "burdens are lifted."   Objective:   Filed Vitals:   03/28/13 1352 03/28/13 1744 03/28/13 2009 03/29/13 0437  BP: 128/71 128/71 113/63 122/80  Pulse: 72 74 77 75  Temp: 97.8 F (36.6 C) 98.1 F (36.7 C) 98.1 F (36.7 C) 97.2 F (36.2 C)  TempSrc: Oral Oral Oral Oral  Resp: 18 18 18 20   Height:      Weight:    79.1 kg (174 lb 6.1 oz)  SpO2: 95% 96% 96% 100%    Wt Readings from Last 3 Encounters:  03/29/13 79.1 kg (174 lb 6.1 oz)  02/14/13 82.6 kg (182 lb 1.6 oz)  02/07/13 82.1 kg (181 lb)     Intake/Output Summary (Last 24 hours) at 03/29/13 8638 Last data filed at 03/29/13 1771  Gross per 24 hour  Intake   1000 ml  Output   1700 ml  Net   -700 ml    Exam  General: Well developed, well nourished, NAD, appears stated age  HEENT: NCAT, mucous membranes  moist.   Neck: Supple, no masses  Cardiovascular: S1 S2 auscultated, no rubs, murmurs or gallops. Regular rate and rhythm.  Respiratory: Clear to auscultation bilaterally with equal chest rise  Abdomen: Soft, nontender, nondistended, + bowel sounds  Extremities: warm dry without cyanosis clubbing or edema  Neuro: AAOx3, cranial nerves grossly intact.   Skin: Without rashes exudates or nodules  Psych: Normal affect and demeanor with intact judgement and insight  Data Review   Micro Results Recent Results (from the past 240 hour(s))  URINE CULTURE     Status: None   Collection Time    03/24/13 11:35 AM      Result Value Ref Range Status   Specimen Description URINE, CATHETERIZED   Final   Special Requests NONE   Final   Culture  Setup Time     Final   Value: 03/24/2013 18:05     Performed at [facility] Count     Final   Value: NO GROWTH     Performed at [facility]   Culture     Final   Value: NO GROWTH     Performed at [facility]   Report Status 03/25/2013 FINAL   Final    Radiology Reports Dg Chest 2 View  03/25/2013   CLINICAL DATA:  Near syncope.  Left chest pain.  EXAM: CHEST  2 VIEW  COMPARISON:  03/24/2013  FINDINGS: Left pacer/AICD remains in place, unchanged. Cardiomegaly. Lungs are clear. No effusions. No acute bony abnormality.  IMPRESSION: Cardiomegaly.  No active disease.   Electronically Signed   By: Charlett Nose M.D.   On: 03/25/2013 18:20   Dg Chest 2 View  03/24/2013   CLINICAL DATA:  Cough and shortness of breath.  Weakness.  EXAM: CHEST  2 VIEW  COMPARISON:  02/09/2013  FINDINGS: AICD in place. Chronic cardiomegaly. Slight pulmonary vascular prominence, unchanged. No acute infiltrates or effusions. Slight scarring at the lung bases posteriorly.  IMPRESSION: No change since the prior study. Chronic cardiomegaly and slight pulmonary vascular prominence.   Electronically Signed   By: Geanie Cooley M.D.   On: 03/24/2013  10:38    CBC  Recent Labs Lab 03/24/13 1005 03/25/13 1856 03/26/13 0236 03/28/13 0431  WBC 5.9 6.6 7.2 5.0  HGB 12.4 11.8* 12.8 11.5*  HCT 38.3 35.9* 39.0 35.7*  PLT 223 227 223 221  MCV 89.1 88.0 88.2  88.4  MCH 28.8 28.9 29.0 28.5  MCHC 32.4 32.9 32.8 32.2  RDW 15.9* 15.8* 15.9* 16.0*  LYMPHSABS 1.4 1.9 1.6  --   MONOABS 0.7 0.6 0.7  --   EOSABS 0.1 0.1 0.1  --   BASOSABS 0.0 0.0 0.0  --     Chemistries   Recent Labs Lab 03/24/13 1005 03/25/13 1730 03/26/13 0236 03/28/13 0431 03/29/13 0415  NA 137 134* 135* 132* 133*  K 3.9 4.4 5.1 4.7 4.7  CL 102 97 99 96 97  CO2 20 18* 20 22 23   GLUCOSE 208* 192* 205* 170* 137*  BUN 22 32* 28* 32* 34*  CREATININE 1.06 1.09 1.09 1.22* 1.58*  CALCIUM 10.2 10.9* 10.4 9.7 10.2  AST  --  30 37  --   --   ALT  --  43* 48*  --   --   ALKPHOS  --  72 69  --   --   BILITOT  --  0.6 0.6  --   --    ------------------------------------------------------------------------------------------------------------------ estimated creatinine clearance is 32.9 ml/min (by C-G formula based on Cr of 1.58). ------------------------------------------------------------------------------------------------------------------ No results found for this basename: HGBA1C,  in the last 72 hours ------------------------------------------------------------------------------------------------------------------ No results found for this basename: CHOL, HDL, LDLCALC, TRIG, CHOLHDL, LDLDIRECT,  in the last 72 hours ------------------------------------------------------------------------------------------------------------------ No results found for this basename: TSH, T4TOTAL, FREET3, T3FREE, THYROIDAB,  in the last 72 hours ------------------------------------------------------------------------------------------------------------------ No results found for this basename: VITAMINB12, FOLATE, FERRITIN, TIBC, IRON, RETICCTPCT,  in the last 72 hours  Coagulation  profile No results found for this basename: INR, PROTIME,  in the last 168 hours  No results found for this basename: DDIMER,  in the last 72 hours  Cardiac Enzymes  Recent Labs Lab 03/25/13 1856 03/26/13 0236 03/26/13 0908  TROPONINI <0.30 <0.30 <0.30   ------------------------------------------------------------------------------------------------------------------ No components found with this basename: POCBNP,     Bita Cartwright D.O. on 03/29/2013 at 7:28 AM  Between 7am to 7pm - Pager - [phone]  After 7pm go to [email or web address] - password [email or web address]  And look for the night coverage person covering for me after hours  [facility] Office  [phone]

## 2013-03-29 NOTE — Progress Notes (Signed)
Patient had c/o aching and burning to right hand/wrist from IV infiltrate.  Warm compress applied and PRN Tramadol administered as ordered. Patient currently resting comfortably, will continue to monitor. Paula [name]

## 2013-03-29 NOTE — Progress Notes (Signed)
PT Cancellation Note  Patient Details Name: Paula [name] MRN: [id number] DOB: 07/07/1940   Cancelled Treatment:    Reason Eval/Treat Not Completed: Pain limiting ability to participate;Fatigue/lethargy limiting ability to participate.  Patient declined PT today.  Will return tomorrow for PT evaluation.   Vena Austria 03/29/2013, 4:24 PM Durenda Hurt. Renaldo Fiddler, [facility] Pager [phone]

## 2013-03-29 NOTE — Progress Notes (Signed)
Patient requested something for generalized pain and body aches.  PRN Tramadol administered as ordered. Patient currently resting comfortably, will continue to monitor. Paula [name]

## 2013-03-30 DIAGNOSIS — F0390 Unspecified dementia without behavioral disturbance: Secondary | ICD-10-CM

## 2013-03-30 LAB — RENAL FUNCTION PANEL
ALBUMIN: 3.2 g/dL — AB (ref 3.5–5.2)
BUN: 27 mg/dL — AB (ref 6–23)
CALCIUM: 10.1 mg/dL (ref 8.4–10.5)
CHLORIDE: 100 meq/L (ref 96–112)
CO2: 22 mEq/L (ref 19–32)
CREATININE: 1.22 mg/dL — AB (ref 0.50–1.10)
GFR calc Af Amer: 50 mL/min — ABNORMAL LOW (ref >90)
GFR calc non Af Amer: 43 mL/min — ABNORMAL LOW (ref >90)
Glucose, Bld: 175 mg/dL — ABNORMAL HIGH (ref 70–99)
PHOSPHORUS: 2.5 mg/dL (ref 2.3–4.6)
Potassium: 4.7 mEq/L (ref 3.7–5.3)
Sodium: 135 mEq/L — ABNORMAL LOW (ref 137–147)

## 2013-03-30 LAB — GLUCOSE, CAPILLARY
GLUCOSE-CAPILLARY: 172 mg/dL — AB (ref 70–99)
Glucose-Capillary: 111 mg/dL — ABNORMAL HIGH (ref 70–99)
Glucose-Capillary: 224 mg/dL — ABNORMAL HIGH (ref 70–99)
Glucose-Capillary: 119 mg/dL — ABNORMAL HIGH (ref 70–99)

## 2013-03-30 MED ORDER — LEVOFLOXACIN 750 MG PO TABS: 750.0000 mg | ORAL_TABLET | ORAL | Status: DC

## 2013-03-30 NOTE — Plan of Care (Signed)
Problem: Phase II Progression Outcomes Goal: Begin discharge teaching Outcome: Not Met (add Reason) Pt confused

## 2013-03-30 NOTE — Discharge Summary (Addendum)
Physician Discharge Summary  EMMALEA [name] EKC:[id number] DOB: March 28, 1940 DOA: 03/25/2013  PCP: [name], MD  Admit date: 03/25/2013 Discharge date: 03/31/2013  Time spent: 45 minutes  Recommendations for Outpatient Follow-up:  Patient will be discharged to skilled nursing facility. She spoke with her primary care physician within one week of discharge. Patient should continue her medications as prescribed. She should follow a heart healthy carb modified diet with 1500 mL fluid restriction per day.  Discharge Diagnoses:  Principal Problem:   Acute respiratory failure Active Problems:   DM2 (diabetes mellitus, type 2)   HTN (hypertension)   Congestive heart failure   Discharge Condition:Stable  Diet recommendation: Heart healthy/carb modified  Filed Weights   03/28/13 0540 03/29/13 0437 03/30/13 0500  Weight: 78.2 kg (172 lb 6.4 oz) 79.1 kg (174 lb 6.1 oz) 78.81 kg (173 lb 11.9 oz)    History of present illness:  Paula [name] is a 73 y.o. female with history of chronic systolic heart failure status post AICD placement last EF 25%, hypertension, diabetes mellitus, hyperlipidemia who was admitted last month for CHF and stroke has been experiencing shortness of breath with cough over the last 4 days. Patient had come to the ER yesterday with cough and short of breath and was discharged home on antibiotics for possible laryngitis. Today patient was found to be lethargic and had a syncopal episode witnessed by patient's daughter. In the ER patient was found to be acutely short of breath with hypertension. Patient was given Lasix 80 mg IV and there has been admitted for further management. Patient states in addition the shortness of breath patient has been having chest pain left anterior chest wall pressure-like nonradiating. EKG shows sinus tachycardia with point Troponins being positive but regular troponin was negative. Patient also had one episode of diarrhea yesterday as  per patient's daughter. Patient was having drenching sweats.    Hospital Course:  Respiratory failure secondary to systolic CHF exacerbation  -was initially placed on IV Lasix, daily weights, and strict input and output  -Echocardiogram to 02/10/13 shows an EF of 25%  -CXR: cardiomegaly, no active disease  -Down 3kg since admission   Syncope  -Likely secondary to coughing spell  -Patient does have AICD in place, and anatomically to monitor for any arrhythmias.  Acute kidney injury  -Secondary to diuresis  -Will gently give IVF and continue to monitor BMP  -Baseline creatinine 1, currently 1.58   Chest pain  -Likely secondary to coughing spells.  -Troponin is cycled and found to be negative  -Currently chest pain-free  -Continue home medications   Metabolic acidosis  -Currently improved. ABG did not show acidosis. We'll continue to monitor.   Hypertension  -Continue coreg, lisinopril   Diabetes mellitus  -Was placed on insulin sliding scale with CBG monitoring during hospitalization -Hemoglobin A1c 8.5  -Should continue her home regimen -Should discuss with PCP titer control  Hyperlipidemia  -Continue Lovaza, fenofibrate, and Lipitor   History of stroke  -Patient recently hospitalized for stroke.  -Continue full dose aspirin.   Sore throat  -Resolved, may use over the counter lozenges  Procedures: None  Consultations: None  Discharge Exam: Filed Vitals:   03/30/13 0928  BP: 122/61  Pulse: 79  Temp:   Resp:    Exam  General: Well developed, well nourished, NAD, appears stated age  HEENT: NCAT, mucous membranes moist.  Neck: Supple, no masses  Cardiovascular: S1 S2 auscultated, no rubs, murmurs or gallops. Regular rate and rhythm.  Respiratory: Clear to auscultation bilaterally with equal chest rise  Abdomen: Soft, nontender, nondistended, + bowel sounds  Extremities: warm dry without cyanosis clubbing or edema  Neuro: AAOx3, cranial nerves grossly  intact.  Skin: Without rashes exudates or nodules  Psych: Normal affect and demeanor with intact judgement and insight  Discharge Instructions      Discharge Orders   Future Orders Complete By Expires   Diet - low sodium heart healthy  As directed    Discharge instructions  As directed    Comments:     Patient will be discharged to home. She spoke with her primary care physician within one week of discharge. Patient should continue her medications as prescribed. She should follow a heart healthy carb modified diet with 1500 mL fluid restriction per day.   Increase activity slowly  As directed        Medication List    STOP taking these medications       azithromycin 250 MG tablet  Commonly known as:  ZITHROMAX Z-PAK      TAKE these medications       alendronate 70 MG tablet  Commonly known as:  FOSAMAX  Take 70 mg by mouth every Wednesday. Take with a full glass of water on an empty stomach.     aspirin 325 MG tablet  Take 325 mg by mouth daily.     atorvastatin 40 MG tablet  Commonly known as:  LIPITOR  Take 40 mg by mouth daily.     carvedilol 3.125 MG tablet  Commonly known as:  COREG  Take 1 tablet (3.125 mg total) by mouth 2 (two) times daily with a meal.     furosemide 40 MG tablet  Commonly known as:  LASIX  Take 1 tablet (40 mg total) by mouth 2 (two) times daily.     insulin aspart 100 UNIT/ML injection  Commonly known as:  novoLOG  Inject 3 Units into the skin 3 (three) times daily before meals. If cbg over 150     insulin detemir 100 UNIT/ML injection  Commonly known as:  LEVEMIR  Inject 100 Units into the skin daily.     levofloxacin 750 MG tablet  Commonly known as:  LEVAQUIN  Take 1 tablet (750 mg total) by mouth every other day.     lisinopril 40 MG tablet  Commonly known as:  PRINIVIL,ZESTRIL  Take 40 mg by mouth daily.     nitroGLYCERIN 0.4 MG SL tablet  Commonly known as:  NITROSTAT  Place 1 tablet (0.4 mg total) under the tongue  every 5 (five) minutes as needed for chest pain.       No Known Allergies Follow-up Information   Follow up with [name], MD. Schedule an appointment as soon as possible for a visit in 1 week. [facility] followup)    Specialty:  Emergency Medicine   Contact information:   [address] [place] [address] [phone]        The results of significant diagnostics from this hospitalization (including imaging, microbiology, ancillary and laboratory) are listed below for reference.    Significant Diagnostic Studies: Dg Chest 2 View  03/25/2013   CLINICAL DATA:  Near syncope.  Left chest pain.  EXAM: CHEST  2 VIEW  COMPARISON:  03/24/2013  FINDINGS: Left pacer/AICD remains in place, unchanged. Cardiomegaly. Lungs are clear. No effusions. No acute bony abnormality.  IMPRESSION: Cardiomegaly.  No active disease.   Electronically Signed   By: Charlett Nose  M.D.   On: 03/25/2013 18:20   Dg Chest 2 View  03/24/2013   CLINICAL DATA:  Cough and shortness of breath.  Weakness.  EXAM: CHEST  2 VIEW  COMPARISON:  02/09/2013  FINDINGS: AICD in place. Chronic cardiomegaly. Slight pulmonary vascular prominence, unchanged. No acute infiltrates or effusions. Slight scarring at the lung bases posteriorly.  IMPRESSION: No change since the prior study. Chronic cardiomegaly and slight pulmonary vascular prominence.   Electronically Signed   By: Geanie CooleyJim  Maxwell M.D.   On: 03/24/2013 10:38    Microbiology: Recent Results (from the past 240 hour(s))  URINE CULTURE     Status: None   Collection Time    03/24/13 11:35 AM      Result Value Ref Range Status   Specimen Description URINE, CATHETERIZED   Final   Special Requests NONE   Final   Culture  Setup Time     Final   Value: 03/24/2013 18:05     [facility]formed at [facility]   Colony Count     Final   Value: NO GROWTH     [facility]formed at [facility]   Culture     Final   Value: NO GROWTH     Performed at [facility]     Report Status 03/25/2013 FINAL   Final     Labs: Basic Metabolic Panel:  Recent Labs Lab 03/25/13 1730 03/26/13 0236 03/28/13 0431 03/29/13 0415 03/30/13 0900  NA 134* 135* 132* 133* 135*  K 4.4 5.1 4.7 4.7 4.7  CL 97 99 96 97 100  CO2 18* 20 22 23 22   GLUCOSE 192* 205* 170* 137* 175*  BUN 32* 28* 32* 34* 27*  CREATININE 1.09 1.09 1.22* 1.58* 1.22*  CALCIUM 10.9* 10.4 9.7 10.2 10.1  PHOS  --   --   --   --  2.5   Liver Function Tests:  Recent Labs Lab 03/25/13 1730 03/26/13 0236 03/30/13 0900  AST 30 37  --   ALT 43* 48*  --   ALKPHOS 72 69  --   BILITOT 0.6 0.6  --   PROT 6.9 7.1  --   ALBUMIN 3.8 3.9 3.2*   No results found for this basename: LIPASE, AMYLASE,  in the last 168 hours No results found for this basename: AMMONIA,  in the last 168 hours CBC:  Recent Labs Lab 03/24/13 1005 03/25/13 1856 03/26/13 0236 03/28/13 0431  WBC 5.9 6.6 7.2 5.0  NEUTROABS 3.7 4.0 4.8  --   HGB 12.4 11.8* 12.8 11.5*  HCT 38.3 35.9* 39.0 35.7*  MCV 89.1 88.0 88.2 88.4  PLT 223 227 223 221   Cardiac Enzymes:  Recent Labs Lab 03/24/13 1005 03/25/13 1856 03/26/13 0236 03/26/13 0908  TROPONINI <0.30 <0.30 <0.30 <0.30   BNP: BNP (last 3 results)  Recent Labs  02/09/13 1439 02/12/13 0517 03/25/13 1730  PROBNP 5031.0* 1839.0* 7979.0*   CBG:  Recent Labs Lab 03/29/13 0558 03/29/13 1140 03/29/13 1619 03/29/13 2115 03/30/13 0558  GLUCAP 138* 155* 147* 169* 111*       Signed:  Jaanai Salemi  [facility] 03/30/2013, 11:22 AM

## 2013-03-30 NOTE — Progress Notes (Signed)
Pt alert to self, forgetful at times, no c/o pain, vss, pt stable

## 2013-03-30 NOTE — Plan of Care (Signed)
Problem: Phase I Progression Outcomes Goal: EF % per last Echo/documented,Core Reminder form on chart Outcome: Completed/Met Date Met:  03/30/13 EF 25% from feb 2015

## 2013-03-30 NOTE — Progress Notes (Signed)
Per MD- patient is medically stable for d/c.  Physical Therapy evaluated patient earlier today and recommended SNFor 24 hour supervision at home. Per Nursing- daughter is requesting [facility]- SNF.  CSW had left 5 messages for patient's daughter [name] throughout the day to discuss placement but was unsuccessful in reaching her. CSW called daughter again at 5:10 and was able to reach her. She stated that her phone has not been working properly.  CSW discussed above and daughter is requesting [facility] as her mother has been a patient there 2 times in the past.  FL2 was completed and sent to [facility].  Bed offer received from [facility] Admissions at [facility] for tomorrow.  Daughter to go to facility at 12:00 tomorrow to sign admission paperwork. CSW will facilitate d/c in the a.m.  Patient's nurse- Jes notified of above.  A Brief SW Psychoscial Assessment will follow.    Lorri Frederick. [place]  [phone]

## 2013-03-30 NOTE — Discharge Instructions (Signed)
Heart Failure °Heart failure is a condition in which the heart has trouble pumping blood. This means your heart does not pump blood efficiently for your body to work well. In some cases of heart failure, fluid may back up into your lungs or you may have swelling (edema) in your lower legs. Heart failure is usually a long-term (chronic) condition. It is important for you to take good care of yourself and follow your caregiver's treatment plan. °CAUSES  °Some health conditions can cause heart failure. Those health conditions include: °· High blood pressure (hypertension) causes the heart muscle to work harder than normal. When pressure in the blood vessels is high, the heart needs to pump (contract) with more force in order to circulate blood throughout the body. High blood pressure eventually causes the heart to become stiff and weak. °· Coronary artery disease (CAD) is the buildup of cholesterol and fat (plaque) in the arteries of the heart. The blockage in the arteries deprives the heart muscle of oxygen and blood. This can cause chest pain and may lead to a heart attack. High blood pressure can also contribute to CAD. °· Heart attack (myocardial infarction) occurs when 1 or more arteries in the heart become blocked. The loss of oxygen damages the muscle tissue of the heart. When this happens, part of the heart muscle dies. The injured tissue does not contract as well and weakens the heart's ability to pump blood. °· Abnormal heart valves can cause heart failure when the heart valves do not open and close properly. This makes the heart muscle pump harder to keep the blood flowing. °· Heart muscle disease (cardiomyopathy or myocarditis) is damage to the heart muscle from a variety of causes. These can include drug or alcohol abuse, infections, or unknown reasons. These can increase the risk of heart failure. °· Lung disease makes the heart work harder because the lungs do not work properly. This can cause a strain  on the heart, leading it to fail. °· Diabetes increases the risk of heart failure. High blood sugar contributes to high fat (lipid) levels in the blood. Diabetes can also cause slow damage to tiny blood vessels that carry important nutrients to the heart muscle. When the heart does not get enough oxygen and food, it can cause the heart to become weak and stiff. This leads to a heart that does not contract efficiently. °· Other conditions can contribute to heart failure. These include abnormal heart rhythms, thyroid problems, and low blood counts (anemia). °Certain unhealthy behaviors can increase the risk of heart failure. Those unhealthy behaviors include: °· Being overweight. °· Smoking or chewing tobacco. °· Eating foods high in fat and cholesterol. °· Abusing illicit drugs or alcohol. °· Lacking physical activity. °SYMPTOMS  °Heart failure symptoms may vary and can be hard to detect. Symptoms may include: °· Shortness of breath with activity, such as climbing stairs. °· Persistent cough. °· Swelling of the feet, ankles, legs, or abdomen. °· Unexplained weight gain. °· Difficulty breathing when lying flat (orthopnea). °· Waking from sleep because of the need to sit up and get more air. °· Rapid heartbeat. °· Fatigue and loss of energy. °· Feeling lightheaded, dizzy, or close to fainting. °· Loss of appetite. °· Nausea. °· Increased urination during the night (nocturia). °DIAGNOSIS  °A diagnosis of heart failure is based on your history, symptoms, physical examination, and diagnostic tests. °Diagnostic tests for heart failure may include: °· Echocardiography. °· Electrocardiography. °· Chest X-ray. °· Blood tests. °· Exercise   stress test. °· Cardiac angiography. °· Radionuclide scans. °TREATMENT  °Treatment is aimed at managing the symptoms of heart failure. Medicines, behavioral changes, or surgical intervention may be necessary to treat heart failure. °· Medicines to help treat heart failure may  include: °· Angiotensin-converting enzyme (ACE) inhibitors. This type of medicine blocks the effects of a blood protein called angiotensin-converting enzyme. ACE inhibitors relax (dilate) the blood vessels and help lower blood pressure. °· Angiotensin receptor blockers. This type of medicine blocks the actions of a blood protein called angiotensin. Angiotensin receptor blockers dilate the blood vessels and help lower blood pressure. °· Water pills (diuretics). Diuretics cause the kidneys to remove salt and water from the blood. The extra fluid is removed through urination. This loss of extra fluid lowers the volume of blood the heart pumps. °· Beta blockers. These prevent the heart from beating too fast and improve heart muscle strength. °· Digitalis. This increases the force of the heartbeat. °· Healthy behavior changes include: °· Obtaining and maintaining a healthy weight. °· Stopping smoking or chewing tobacco. °· Eating heart healthy foods. °· Limiting or avoiding alcohol. °· Stopping illicit drug use. °· Physical activity as directed by your caregiver. °· Surgical treatment for heart failure may include: °· A procedure to open blocked arteries, repair damaged heart valves, or remove damaged heart muscle tissue. °· A pacemaker to improve heart muscle function and control certain abnormal heart rhythms. °· An internal cardioverter defibrillator to treat certain serious abnormal heart rhythms. °· A left ventricular assist device to assist the pumping ability of the heart. °HOME CARE INSTRUCTIONS  °· Take your medicine as directed by your caregiver. Medicines are important in reducing the workload of your heart, slowing the progression of heart failure, and improving your symptoms. °· Do not stop taking your medicine unless directed by your caregiver. °· Do not skip any dose of medicine. °· Refill your prescriptions before you run out of medicine. Your medicines are needed every day. °· Take over-the-counter  medicine only as directed by your caregiver or pharmacist. °· Engage in moderate physical activity if directed by your caregiver. Moderate physical activity can benefit some people. The elderly and people with severe heart failure should consult with a caregiver for physical activity recommendations. °· Eat heart healthy foods. Food choices should be free of trans fat and low in saturated fat, cholesterol, and salt (sodium). Healthy choices include fresh or frozen fruits and vegetables, fish, lean meats, legumes, fat-free or low-fat dairy products, and whole grain or high fiber foods. Talk to a dietitian to learn more about heart healthy foods. °· Limit sodium if directed by your caregiver. Sodium restriction may reduce symptoms of heart failure in some people. Talk to a dietitian to learn more about heart healthy seasonings. °· Use healthy cooking methods. Healthy cooking methods include roasting, grilling, broiling, baking, poaching, steaming, or stir-frying. Talk to a dietitian to learn more about healthy cooking methods. °· Limit fluids if directed by your caregiver. Fluid restriction may reduce symptoms of heart failure in some people. °· Weigh yourself every day. Daily weights are important in the early recognition of excess fluid. You should weigh yourself every morning after you urinate and before you eat breakfast. Wear the same amount of clothing each time you weigh yourself. Record your daily weight. Provide your caregiver with your weight record. °· Monitor and record your blood pressure if directed by your caregiver. °· Check your pulse if directed by your caregiver. °· Lose weight if directed   by your caregiver. Weight loss may reduce symptoms of heart failure in some people. °· Stop smoking or chewing tobacco. Nicotine makes your heart work harder by causing your blood vessels to constrict. Do not use nicotine gum or patches before talking to your caregiver. °· Schedule and attend follow-up visits as  directed by your caregiver. It is important to keep all your appointments. °· Limit alcohol intake to no more than 1 drink per day for nonpregnant women and 2 drinks per day for men. Drinking more than that is harmful to your heart. Tell your caregiver if you drink alcohol several times a week. Talk with your caregiver about whether alcohol is safe for you. If your heart has already been damaged by alcohol or you have severe heart failure, drinking alcohol should be stopped completely. °· Stop illicit drug use. °· Stay up-to-date with immunizations. It is especially important to prevent respiratory infections through current pneumococcal and influenza immunizations. °· Manage other health conditions such as hypertension, diabetes, thyroid disease, or abnormal heart rhythms as directed by your caregiver. °· Learn to manage stress. °· Plan rest periods when fatigued. °· Learn strategies to manage high temperatures. If the weather is extremely hot: °· Avoid vigorous physical activity. °· Use air conditioning or fans or seek a cooler location. °· Avoid caffeine and alcohol. °· Wear loose-fitting, lightweight, and light-colored clothing. °· Learn strategies to manage cold temperatures. If the weather is extremely cold: °· Avoid vigorous physical activity. °· Layer clothes. °· Wear mittens or gloves, a hat, and a scarf when going outside. °· Avoid alcohol. °· Obtain ongoing education and support as needed. °· Participate or seek rehabilitation as needed to maintain or improve independence and quality of life. °SEEK MEDICAL CARE IF:  °· Your weight increases by 03 lb/1.4 kg in 1 day or 05 lb/2.3 kg in a week. °· You have increasing shortness of breath that is unusual for you. °· You are unable to participate in your usual physical activities. °· You tire easily. °· You cough more than normal, especially with physical activity. °· You have any or more swelling in areas such as your hands, feet, ankles, or abdomen. °· You  are unable to sleep because it is hard to breathe. °· You feel like your heart is beating fast (palpitations). °· You become dizzy or lightheaded upon standing up. °SEEK IMMEDIATE MEDICAL CARE IF:  °· You have difficulty breathing. °· There is a change in mental status such as decreased alertness or difficulty with concentration. °· You have a pain or discomfort in your chest. °· You have an episode of fainting (syncope). °MAKE SURE YOU:  °· Understand these instructions. °· Will watch your condition. °· Will get help right away if you are not doing well or get worse. °Document Released: 12/18/2004 Document Revised: 04/14/2012 Document Reviewed: 01/10/2012 °Exit[employer]® Patient Information ©2014 [employer]. ° °

## 2013-03-30 NOTE — Evaluation (Signed)
Physical Therapy Evaluation Patient Details Name: Paula [name] MRN: [id number] DOB: 26-Apr-1940 Today's Date: 03/30/2013   History of Present Illness  Paula [name] is a 73 y.o. female with history of chronic systolic heart failure status post AICD placement last EF 25%, hypertension, diabetes mellitus, hyperlipidemia who was admitted last month for CHF and stroke has been experiencing shortness of breath with cough over the last 4 days  Clinical Impression  Pt very confused stating she is at her dgtrs house, "you don't know me", "you are scaring me" (with instruction she is in the hospital and nurses are the voices she hears in the hospital. Spoke to dgtr [name] on the phone who stated her mom is usually oriented and only needs some assist with ADLs but is typically cognitively intact. Dgtr also states she works and has an Engineer, production while she is gone but she does not feel she can take care of pt at this time and is interested in ST-SNF until pt with improved cognition and safer to be at home. Pt with cognitive and balance deficits impairing function who will benefit from acute therapy to address these deficits to decrease burden of care.     Follow Up Recommendations SNF;Supervision/Assistance - 24 hour    Equipment Recommendations  None recommended by PT    Recommendations for Other Services       Precautions / Restrictions Precautions Precautions: Fall Restrictions Weight Bearing Restrictions: No      Mobility  Bed Mobility Overal bed mobility: Needs Assistance Bed Mobility: Supine to Sit     Supine to sit: Supervision     General bed mobility comments: cueing for sequence and initiation with increased time to complete due to confusion   Transfers Overall transfer level: Needs assistance   Transfers: Sit to/from Stand Sit to Stand: Min guard         General transfer comment: cues for hand placement, safety and sequence  Ambulation/Gait Ambulation/Gait  assistance: Min guard Ambulation Distance (Feet): 450 Feet Assistive device: Rolling walker (2 wheeled)     Gait velocity interpretation: at or above normal speed for age/gender General Gait Details: cues to step into RW for safety, controlled speed to decrease fall risk and directional cues  Stairs            Wheelchair Mobility    Modified Rankin (Stroke Patients Only)       Balance Overall balance assessment: Needs assistance   Sitting balance-Leahy Scale: Good       Standing balance-Leahy Scale: Poor                       Pertinent Vitals/Pain No pain HR 102    Home Living Family/patient expects to be discharged to:: Private residence Living Arrangements: Children Available Help at Discharge: Family;Personal care attendant Type of Home: House Home Access: Stairs to enter Entrance Stairs-Rails: None Entrance Stairs-Number of Steps: 3 Home Layout: One level Home Equipment: Environmental consultant - 2 wheels      Prior Function Level of Independence: Needs assistance   Gait / Transfers Assistance Needed: RW for all gait  ADL's / Homemaking Assistance Needed: Min A for bathing and dressing        Hand Dominance        Extremity/Trunk Assessment   Upper Extremity Assessment: Generalized weakness           Lower Extremity Assessment: Generalized weakness      Cervical / Trunk Assessment: Normal  Communication  Communication: No difficulties  Cognition Arousal/Alertness: Awake/alert Behavior During Therapy: Anxious Overall Cognitive Status: Impaired/Different from baseline Area of Impairment: Memory;Following commands;Safety/judgement;Awareness;Problem solving;Orientation Orientation Level: Place;Time;Situation;Disoriented to   Memory: Decreased short-term memory Following Commands: Follows one step commands inconsistently;Follows one step commands with increased time Safety/Judgement: Decreased awareness of deficits   Problem Solving: Slow  processing;Difficulty sequencing;Requires verbal cues;Requires tactile cues      General Comments      Exercises        Assessment/Plan    PT Assessment Patient needs continued PT services  PT Diagnosis Altered mental status;Difficulty walking   PT Problem List Decreased cognition;Decreased activity tolerance;Decreased balance;Decreased safety awareness;Decreased knowledge of use of DME  PT Treatment Interventions Gait training;DME instruction;Functional mobility training;Therapeutic activities;Patient/family education;Stair training   PT Goals (Current goals can be found in the Care Plan section) Acute Rehab PT Goals Patient Stated Goal: return home PT Goal Formulation: With patient Time For Goal Achievement: 04/13/13 Potential to Achieve Goals: Fair    Frequency Min 2X/week   Barriers to discharge Decreased caregiver support      End of Session Equipment Utilized During Treatment: Gait belt Activity Tolerance: Patient tolerated treatment well Patient left: in chair;with call bell/phone within reach;with chair alarm set         Time: [id number] PT Time Calculation (min): 25 min   Charges:   PT Evaluation $Initial PT Evaluation Tier I: 1 Procedure PT Treatments $Therapeutic Activity: 8-22 mins   PT G CodesDelorse Lek:          Tabor, Shanty Ginty Beth 03/30/2013, 11:31 AM  Delaney MeigsMaija Tabor Yolanda Dockendorf, PT [phone]

## 2013-03-31 LAB — GLUCOSE, CAPILLARY
GLUCOSE-CAPILLARY: 138 mg/dL — AB (ref 70–99)
Glucose-Capillary: 186 mg/dL — ABNORMAL HIGH (ref 70–99)

## 2013-03-31 NOTE — Clinical Social Work Psychosocial (Addendum)
Clinical Social Work Department BRIEF PSYCHOSOCIAL ASSESSMENT 03/30/2013  Patient:  JANAYIA, BURGGRAF     Account Number:  [id number]     Admit date:  03/25/2013  Clinical Social Worker:  Iona Coach  Date/Time:  03/30/2013 01:00 PM  Referred by:  Physician  Date Referred:  03/30/2013 Referred for  SNF Placement   Other Referral:   Interview type:  Other - See comment Other interview type:   Patient  Messages left for patient's daughter-- Alecia    PSYCHOSOCIAL DATA Living Status:  WITH ADULT CHILDREN Admitted from facility:   Level of care:   Primary support name:  Scarlett Presto  [phone] Primary support relationship to patient:  CHILD, ADULT Degree of support available:   Good Support    CURRENT CONCERNS Current Concerns  Post-Acute Placement   Other Concerns:    SOCIAL WORK ASSESSMENT / PLAN 73 year old female who lives at home with her daughter Estill Batten.  Patient was scheduled for d/c home today and nursing notified CSW that patient's daughter is now requesting short term SNF. CSW has left multiple messages for daughter and awaiting return call so that SNF placement can be facilitated. CSW met with patient and attempted to obtain history but patient appears to be quite confused. She is unable to provide adequate history or state her wishes. CSW will initiate bed search per nursing's report of daughter's wishes and continue attempts to reach her. Fl2 completed and sent to [facility]. Once daughter is contacted- hopefully can facilitate d/c today.   Assessment/plan status:  Psychosocial Support/Ongoing Assessment of Needs Other assessment/ plan:   Information/referral to community resources:   SNF bed list placed in the room but patient is unable to consider bed list due to confusion.    PATIENT'S/FAMILY'S RESPONSE TO PLAN OF CARE: Patient is alert and oriented to person only. She is able to respond verbally but she is quite confused.  Patient agreed to  have CSW contact her daughter Estill Batten. CSW will continue attempts to reach patient's daughter and arrange SNF placement as patient is medically stable for d/c. Lorie Phenix. [place], [place]

## 2013-03-31 NOTE — Progress Notes (Addendum)
DC IV, DC Tele, DC to SNF. Discharge instructions and medications prepared by Child psychotherapist. Report given. EMS here to to transport patient. Patient appears in no acute distress.

## 2013-03-31 NOTE — Progress Notes (Signed)
Report called to [facility] place and given to receiving RN.

## 2013-03-31 NOTE — Clinical Social Work Placement (Signed)
     Clinical Social Work Department CLINICAL SOCIAL WORK PLACEMENT NOTE 03/31/2013  Patient:  Paula [name], Paula [name]  Account Number:  [id number] Admit date:  03/25/2013  Clinical Social Worker:  Lupita Leash Jacquilyn Seldon, LCSWA  Date/time:  03/30/2013 10:45 AM  Clinical Social Work is seeking post-discharge placement for this patient at the following level of care:   SKILLED NURSING   (*CSW will update this form in Epic as items are completed)   03/30/2013  Patient/family provided with [facility] Department of Clinical Social Works list of facilities offering this level of care within the geographic area requested by the patient (or if unable, by the patients family).  03/30/2013  Patient/family informed of their freedom to choose among providers that offer the needed level of care, that participate in Medicare, Medicaid or managed care program needed by the patient, have an available bed and are willing to accept the patient.    Patient/family informed of [facility] ownership interest in [facility], as well as of the fact that they are under no obligation to receive care at this facility.  PASARR submitted to EDS on  PASARR number received from EDS on   FL2 transmitted to all facilities in geographic area requested by pt/family on  03/30/2013 FL2 transmitted to all facilities within larger geographic area on   Patient informed that his/her managed care company has contracts with or will negotiate with  certain facilities, including the following:   NA     Patient/family informed of bed offers received:  03/30/2013 Patient chooses bed at [facility] Physician recommends and patient chooses bed at    Patient to be transferred to [facility] on  03/31/2013 Patient to be transferred to facility by Ambulance  Sharin Mons)  The following physician request were entered in Epic:   Additional Comments: 03/31/13   OK per MD for d/c today to SNF. Patient was medically stable for d/c  03/30/13 but CSW was unable to reach patient's daughter until after 5 pm and it was too late to facilitate d/c.  Patient was notified of d/c but she remains confused stating "I trust you not to pull the wool over my eyes".  Daughter to go to facility to sign paperwork. Nursing notified to call report. No further CSW needs identified. CSW signing off. Lorri Frederick. Aurthur Wingerter, LCSWA [phone]

## 2013-03-31 NOTE — Progress Notes (Signed)
Patient was to be discharged on 03/30/2013 however daughter was unreachable until lately. Patient will be discharged to skilled nursing facility today. Patient is still stable for discharge. No change to current plan.   Time spent: 10 minutes  Shoji Pertuit D.O. [facility] Pager [phone]  If 7PM-7AM, please contact night-coverage [email or web address] Password [facility] 03/31/2013, 10:11 AM

## 2013-04-02 ENCOUNTER — Non-Acute Institutional Stay (SKILLED_NURSING_FACILITY): Payer: Medicare Other | Admitting: Adult Health

## 2013-04-02 DIAGNOSIS — I639 Cerebral infarction, unspecified: Secondary | ICD-10-CM

## 2013-04-02 DIAGNOSIS — J309 Allergic rhinitis, unspecified: Secondary | ICD-10-CM

## 2013-04-02 DIAGNOSIS — I10 Essential (primary) hypertension: Secondary | ICD-10-CM

## 2013-04-02 DIAGNOSIS — M810 Age-related osteoporosis without current pathological fracture: Secondary | ICD-10-CM

## 2013-04-02 DIAGNOSIS — I5043 Acute on chronic combined systolic (congestive) and diastolic (congestive) heart failure: Secondary | ICD-10-CM

## 2013-04-02 DIAGNOSIS — I509 Heart failure, unspecified: Secondary | ICD-10-CM

## 2013-04-02 DIAGNOSIS — E1149 Type 2 diabetes mellitus with other diabetic neurological complication: Secondary | ICD-10-CM

## 2013-04-02 DIAGNOSIS — I6350 Cerebral infarction due to unspecified occlusion or stenosis of unspecified cerebral artery: Secondary | ICD-10-CM

## 2013-04-02 DIAGNOSIS — J9600 Acute respiratory failure, unspecified whether with hypoxia or hypercapnia: Secondary | ICD-10-CM

## 2013-04-09 ENCOUNTER — Encounter: Payer: Self-pay | Admitting: Adult Health

## 2013-04-09 DIAGNOSIS — M810 Age-related osteoporosis without current pathological fracture: Secondary | ICD-10-CM | POA: Insufficient documentation

## 2013-04-09 DIAGNOSIS — E1149 Type 2 diabetes mellitus with other diabetic neurological complication: Secondary | ICD-10-CM | POA: Insufficient documentation

## 2013-04-09 DIAGNOSIS — J309 Allergic rhinitis, unspecified: Secondary | ICD-10-CM | POA: Insufficient documentation

## 2013-04-09 MED ORDER — LORATADINE 10 MG PO TABS: 10.0000 mg | ORAL_TABLET | Freq: Every day | ORAL | Status: AC | PRN

## 2013-04-09 MED ORDER — NICOTINE 21 MG/24HR TD PT24: 21.0000 mg | MEDICATED_PATCH | Freq: Every day | TRANSDERMAL | Status: DC

## 2013-04-09 NOTE — Progress Notes (Signed)
Patient ID: Paula HumblesAlice A Amir, female   DOB: 22-Oct-1940, 73 y.o.   MRN: [id number]     [facility]  No Known Allergies   Chief Complaint  Patient presents with  . Hospitalization Follow-up    HPI:  She has been hospitalized for acute chf; respiratory failure; and syncope. She is here for short term rehab. Her goal at this time is to return back home. She is a poor historian and is unable to fully participate in the hpi or ros.    Past Medical History  Diagnosis Date  . Systolic CHF     Non ischemic, last Cath 09/21/08 - normal left main, LAD, LCx, ramus intermedius, RCA  . Hypertension   . Stroke   . Headache(784.0)   . Diabetes mellitus   . Dementia 05/27/2012  . Osteopenia     DEXA 2012 : T score hip -2.3, femur -2.1  . CHF (congestive heart failure)   . Shortness of breath   . Pacemaker   . Myocardial infarction 1991    [name]/notes 12/21/2001  . 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 Automatic implantable cardioverter-defibrillator in situ     Past Surgical History  Procedure Laterality Date  . Insert / replace / remove pacemaker  12/15/2010  . Back surgery    . Abdominal hysterectomy    . Cardiac catheterization      [name]/notes 12/22/2001  . Cardiac defibrillator placement  12/15/2010    [name]/notes 12/15/2010    VITAL SIGNS BP 147/82  Pulse 80  Ht 5\' 5"  (1.651 m)  Wt 174 lb (78.926 kg)  BMI 28.96 kg/m2   Patient's Medications  New Prescriptions   No medications on file  Previous Medications   ALENDRONATE (FOSAMAX) 70 MG TABLET    Take 70 mg by mouth every Wednesday. Take with a full glass of water on an empty stomach.   ASPIRIN 325 MG TABLET    Take 325 mg by mouth daily.   ATORVASTATIN (LIPITOR) 40 MG TABLET    Take 40 mg by mouth daily.   CARVEDILOL (COREG) 3.125 MG TABLET    Take 1 tablet (3.125 mg total) by mouth 2 (two) times daily with a meal.   FUROSEMIDE (LASIX) 40 MG TABLET    Take 1 tablet (40 mg total) by mouth 2 (two)  times daily.   INSULIN DETEMIR (LEVEMIR) 100 UNIT/ML INJECTION    Inject 100 Units into the skin daily.   INSULIN LISPRO (HUMALOG) 100 UNIT/ML INJECTION    Inject 3 Units into the skin 3 (three) times daily before meals. For cbg >=150   LEVOFLOXACIN (LEVAQUIN) 750 MG TABLET    Take 1 tablet (750 mg total) by mouth every other day.   LISINOPRIL (PRINIVIL,ZESTRIL) 40 MG TABLET    Take 40 mg by mouth daily.   NITROGLYCERIN (NITROSTAT) 0.4 MG SL TABLET    Place 1 tablet (0.4 mg total) under the tongue every 5 (five) minutes as needed for chest pain.  Modified Medications   No medications on file  Discontinued Medications   INSULIN ASPART (NOVOLOG) 100 UNIT/ML INJECTION    Inject 3 Units into the skin 3 (three) times daily before meals. If cbg over 150    SIGNIFICANT DIAGNOSTIC EXAMS  03-24-13: chest x-ray: No change since the prior study. Chronic cardiomegaly and slight pulmonary vascular prominence.   03-25-13: chest x-ray: Cardiomegaly.  No active disease.    LABS REVIEWED:   02-10-13: hgb a1c 8.5;  chol 111; ldl 59; trig 105  03-24-13: wbc 5.9; hgb 12.4; hct 38.3; mcv 89.1 plt 223 ;glucose 208; bun 22; creat 1.06; k+3.9; na++137 03-26-13: wbc 7.2; hgb 12.8; hct 39.0; mcv 88.2; plt 223; glucose 205; bun 28; creat 1.09; k+5.1; na++135; liver normal albumin 3.9; tsh 4.257 03-30-13: glucose 175 bun 24; creat 1.22; k+4.7; na++135; albumin 3.2; phos 2.5     Review of Systems  Constitutional: Negative for malaise/fatigue.  Respiratory: Negative for cough and shortness of breath.   Cardiovascular: Negative for chest pain and palpitations.  Gastrointestinal: Negative for heartburn, abdominal pain and constipation.  Musculoskeletal: Negative for joint pain and myalgias.  Skin: Negative.   Psychiatric/Behavioral: The patient is not nervous/anxious.        Physical Exam  Constitutional: She appears well-developed. No distress.  Overweight   Neck: Neck supple. No JVD present.    Cardiovascular: Normal rate, regular rhythm and intact distal pulses.   Respiratory: Effort normal and breath sounds normal. No respiratory distress. She has no wheezes.  GI: Soft. Bowel sounds are normal. She exhibits no distension. There is no tenderness.  Musculoskeletal: Normal range of motion. She exhibits no edema.  Has generalized weakness present; has trace pedal edema.   Neurological: She is alert.  Skin: Skin is warm and dry. She is not diaphoretic.     ASSESSMENT/ PLAN:  1. Osteoporosis: will continue fosamax 70 mg weekly   2. Dyslipidemia: will continue lipitor 40 mg daily  3. Hypertension; will continue lisinopril 40 mg daily and coreg 3.125 mg twice daily and will monitor  4. Systolic and diastolic heart failure:EF 25%  will continue 1500 cc fluid restriction; will cotinue lasix 40 mg twice daily; will continue asa 325 mg daily; and will continue ntg as needed for chest pain; will continue to monitor her status.   5. Smoker: will begin nicotine patch 21 mg daily and will monitor  6. Allergic rhinitis: will begin claritin 10 mg daily as needed  7. Acute respiratory failure with infection: will complete levaquin therapy.   8. CVA; is neurologically stable will continue asa 325 mg daily   9. Diabetes: will continue humalog 3 units prior to meals for cbg >=150 and will monitor    Will check cbc ;and bmp next week.   Time spent with patient 50 minutes.     Synthia Innocent NP [facility]  Contact [phone] Monday through Friday 8am- 5pm  After hours call [phone]

## 2013-04-13 ENCOUNTER — Non-Acute Institutional Stay (SKILLED_NURSING_FACILITY): Payer: Medicare Other | Admitting: Internal Medicine

## 2013-04-13 DIAGNOSIS — I509 Heart failure, unspecified: Secondary | ICD-10-CM

## 2013-04-13 DIAGNOSIS — I639 Cerebral infarction, unspecified: Secondary | ICD-10-CM

## 2013-04-13 DIAGNOSIS — E785 Hyperlipidemia, unspecified: Secondary | ICD-10-CM

## 2013-04-13 DIAGNOSIS — R531 Weakness: Secondary | ICD-10-CM

## 2013-04-13 DIAGNOSIS — E1149 Type 2 diabetes mellitus with other diabetic neurological complication: Secondary | ICD-10-CM

## 2013-04-13 DIAGNOSIS — R5383 Other fatigue: Secondary | ICD-10-CM

## 2013-04-13 DIAGNOSIS — I6350 Cerebral infarction due to unspecified occlusion or stenosis of unspecified cerebral artery: Secondary | ICD-10-CM

## 2013-04-13 DIAGNOSIS — I5043 Acute on chronic combined systolic (congestive) and diastolic (congestive) heart failure: Secondary | ICD-10-CM

## 2013-04-13 DIAGNOSIS — R5381 Other malaise: Secondary | ICD-10-CM

## 2013-04-14 ENCOUNTER — Encounter: Payer: Self-pay | Admitting: Internal Medicine

## 2013-04-14 NOTE — Progress Notes (Signed)
Patient ID: Paula [name], female   DOB: 1940-08-13, 73 y.o.   MRN: [id number]     Facility: [facility]    PCP: [name], MD  No Known Allergies  Chief Complaint: new admit  HPI:  73 y/o female patient is here for rehabilitation after hospital admission from 03/25/13-03/31/13 with acute respiratory failure and syncope in setting of chf exacerbation. She responded well to iv diuretics. She had AKIShe had EF of 25%. She has AICD in place.  She has history of chf, dm type 2, HTN and hyperlipidemia She is working with therapy team here and in no distress today.  Review of Systems:  Constitutional: Negative for fever, chills, diaphoresis.  HENT: Negative for congestion, hearing loss and sore throat.   Eyes: Negative for eye pain, blurred vision, double vision and discharge.  Respiratory: Negative for cough, sputum production, shortness of breath    Cardiovascular: Negative for chest pain, palpitations Gastrointestinal: Negative for heartburn, nausea, vomiting, abdominal pain Genitourinary: Negative for dysuria Skin: Negative for itching and rash.  Neurological: Negative for dizziness but has some weakness Psychiatric/Behavioral: Negative for depression and memory loss. The patient is not nervous/anxious.     Past Medical History  Diagnosis Date  . Systolic CHF     Non ischemic, last Cath 09/21/08 - normal left main, LAD, LCx, ramus intermedius, RCA  . Hypertension   . Stroke   . Headache(784.0)   . Diabetes mellitus   . Dementia 05/27/2012  . Osteopenia     DEXA 2012 : T score hip -2.3, femur -2.1  . CHF (congestive heart failure)   . Shortness of breath   . Pacemaker   . Myocardial infarction 1991    [name] 12/21/2001  . 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 Automatic implantable cardioverter-defibrillator in situ    Past Surgical History  Procedure Laterality Date  . Insert / replace / remove  pacemaker  12/15/2010  . Back surgery    . Abdominal hysterectomy    . Cardiac catheterization      [name] 12/22/2001  . Cardiac defibrillator placement  12/15/2010    [name] 12/15/2010   Social History:   reports that she has been smoking Cigarettes.  She has been smoking about 0.00 packs per day. She has never used smokeless tobacco. She reports that she does not drink alcohol or use illicit drugs.  No family history on file.  Medications: Patient's Medications  New Prescriptions   LOSARTAN (COZAAR) 25 MG TABLET    TAKE 1 TABLET EVERY DAY  Previous Medications   ATORVASTATIN (LIPITOR) 40 MG TABLET    Take 40 mg by mouth daily.   FUROSEMIDE (LASIX) 40 MG TABLET    Take 1 tablet (40 mg total) by mouth 2 (two) times daily.   INSULIN DETEMIR (LEVEMIR) 100 UNIT/ML INJECTION    Inject 100 Units into the skin daily.   INSULIN LISPRO (HUMALOG) 100 UNIT/ML INJECTION    Inject 3 Units into the skin 3 (three) times daily before meals. For cbg >=150   LISINOPRIL (PRINIVIL,ZESTRIL) 40 MG TABLET    Take 40 mg by mouth daily.   LORATADINE (CLARITIN) 10 MG TABLET    Take 1 tablet (10 mg total) by mouth daily as needed for allergies.   NICOTINE (NICODERM CQ - DOSED IN MG/24 HOURS) 21 MG/24HR PATCH    Place 1 patch (21 mg total) onto the skin daily.   NITROGLYCERIN (NITROSTAT) 0.4 MG  SL TABLET    Place 1 tablet (0.4 mg total) under the tongue every 5 (five) minutes as needed for chest pain.  Modified Medications   Modified Medication Previous Medication   ALENDRONATE (FOSAMAX) 70 MG TABLET alendronate (FOSAMAX) 70 MG tablet      TAKE 1 TABLET BY MOUTH EVERY WEEK AS DIRECTED    Take 70 mg by mouth every Wednesday. Take with a full glass of water on an empty stomach.   CARVEDILOL (COREG) 3.125 MG TABLET carvedilol (COREG) 3.125 MG tablet      TAKE 1 TABLET TWICE A DAY    Take 1 tablet (3.125 mg total) by mouth 2 (two) times daily with a meal.  Discontinued Medications   ASPIRIN 325 MG TABLET    Take  325 mg by mouth daily.   LEVOFLOXACIN (LEVAQUIN) 750 MG TABLET    Take 1 tablet (750 mg total) by mouth every other day.     Physical Exam: Filed Vitals:   04/13/13 0844  BP: 124/78  Pulse: 74  Temp: 98 F (36.7 C)  Resp: 16  SpO2: 96%   General- elderly female in no acute distress, overweight Head- atraumatic, normocephalic Eyes- PERRLA, EOMI, no pallor, no icterus, no discharge Neck- no lymphadenopathy Cardiovascular- normal s1,s2, no murmurs, trace edema bilaterally Respiratory- bilateral clear to auscultation, no wheeze, no rhonchi, no crackles Abdomen- bowel sounds present, soft, non tender Musculoskeletal- able to move all 4 extremities Neurological- no focal deficit Skin- warm and dry Psychiatry- alert and oriented with normal mood and affect   Labs reviewed: Basic Metabolic Panel:  Recent Labs  03/70/96 0517 02/13/13 0600 02/14/13 0550  03/29/13 0415 03/30/13 0900 04/29/13 1155  NA 136* 136* 137  < > 133* 135* 137  K 4.8 4.4 4.3  < > 4.7 4.7 3.4*  CL 99 98 100  < > 97 100 101  CO2 24 26 23   < > 23 22 19   GLUCOSE 168* 131* 136*  < > 137* 175* 106*  BUN 29* 28* 24*  < > 34* 27* 20  CREATININE 1.29* 1.30* 1.15*  < > 1.58* 1.22* 1.00  CALCIUM 9.2 9.5 9.8  < > 10.2 10.1 10.0  MG 2.1 2.2 2.3  --   --   --   --   PHOS  --   --   --   --   --  2.5  --   < > = values in this interval not displayed. Liver Function Tests:  Recent Labs  02/13/13 0600 03/25/13 1730 03/26/13 0236 03/30/13 0900  AST 17 30 37  --   ALT 47* 43* 48*  --   ALKPHOS 54 72 69  --   BILITOT 0.4 0.6 0.6  --   PROT 6.6 6.9 7.1  --   ALBUMIN 3.6 3.8 3.9 3.2*   No results found for this basename: LIPASE, AMYLASE,  in the last 8760 hours  Recent Labs  07/11/12 1538  AMMONIA 12   CBC:  Recent Labs  03/24/13 1005 03/25/13 1856 03/26/13 0236 03/28/13 0431 04/29/13 1155  WBC 5.9 6.6 7.2 5.0 6.0  NEUTROABS 3.7 4.0 4.8  --   --   HGB 12.4 11.8* 12.8 11.5* 12.7  HCT 38.3 35.9*  39.0 35.7* 39.1  MCV 89.1 88.0 88.2 88.4 87.1  PLT 223 227 223 221 220   Cardiac Enzymes:  Recent Labs  03/26/13 0236 03/26/13 0908 04/29/13 1155  TROPONINI <0.30 <0.30 <0.30   BNP: No components found  with this basename: POCBNP,  CBG:  Recent Labs  03/30/13 2125 03/31/13 0638 03/31/13 1120  GLUCAP 172* 138* 186*    Radiological Exams: Dg Chest 2 View  03/25/2013   CLINICAL DATA:  Near syncope.  Left chest pain.  EXAM: CHEST  2 VIEW  COMPARISON:  03/24/2013  FINDINGS: Left pacer/AICD remains in place, unchanged. Cardiomegaly. Lungs are clear. No effusions. No acute bony abnormality.  IMPRESSION: Cardiomegaly.  No active disease.   Electronically Signed   By: Charlett NoseKevin  Dover M.D.   On: 03/25/2013 18:20   Dg Chest 2 View  03/24/2013   CLINICAL DATA:  Cough and shortness of breath.  Weakness.  EXAM: CHEST  2 VIEW  COMPARISON:  02/09/2013  FINDINGS: AICD in place. Chronic cardiomegaly. Slight pulmonary vascular prominence, unchanged. No acute infiltrates or effusions. Slight scarring at the lung bases posteriorly.  IMPRESSION: No change since the prior study. Chronic cardiomegaly and slight pulmonary vascular prominence.   Electronically Signed   By: Geanie CooleyJim  Maxwell M.D.   On: 03/24/2013 10:38    Assessment/Plan  Generalized weakness Will have her work with physical therapy and occupational therapy team to help with gait training and muscle strengthening exercises.fall precautions. Skin care. Encourage to be out of bed.   CHF Monitor weight, continue fluid restriction. Continue aspirin with coreg, lasix 40 mg bid and prn NTG, monitor kidney function. Breathing currently stable and appears euvolemic  Diabetes type 2 continue current regimen and will monitor cbg. continue statin and aspirin  Osteoporosis continue fosamax 70 mg weekly   Dyslipidemia continue lipitor 40 mg daily  CVA neurologically stable. continue bp meds, asa 325 mg daily and statin    Family/ staff  Communication: reviewed care plan with patient and nursing supervisor  Goals of care: short term rehabilitation  Labs/tests ordered: cbc, cmp    Oneal GroutMAHIMA Melinda Pottinger, MD  [facility] [phone] (Monday-Friday 8 am - 5 pm) [phone] (afterhours)

## 2013-04-17 ENCOUNTER — Non-Acute Institutional Stay (SKILLED_NURSING_FACILITY): Payer: Medicare Other | Admitting: Adult Health

## 2013-04-17 DIAGNOSIS — J9600 Acute respiratory failure, unspecified whether with hypoxia or hypercapnia: Secondary | ICD-10-CM

## 2013-04-17 DIAGNOSIS — I10 Essential (primary) hypertension: Secondary | ICD-10-CM

## 2013-04-17 DIAGNOSIS — I6350 Cerebral infarction due to unspecified occlusion or stenosis of unspecified cerebral artery: Secondary | ICD-10-CM

## 2013-04-17 DIAGNOSIS — I639 Cerebral infarction, unspecified: Secondary | ICD-10-CM

## 2013-04-17 DIAGNOSIS — I509 Heart failure, unspecified: Secondary | ICD-10-CM

## 2013-04-17 DIAGNOSIS — I5043 Acute on chronic combined systolic (congestive) and diastolic (congestive) heart failure: Secondary | ICD-10-CM

## 2013-04-26 ENCOUNTER — Encounter: Payer: Self-pay | Admitting: Adult Health

## 2013-04-26 NOTE — Progress Notes (Signed)
Patient ID: Paula [name], female   DOB: [birth year], 73 y.o.   MRN: [id number]     [facility]  No Known Allergies   Chief Complaint  Patient presents with  . Discharge Note    HPI:  She is being discharged to home with home health for pt/ot/nursing. She will not need prescriptions to be written. Her blood pressure has been elevated will begin her on cozaar 25 mg daily. She had been hospitalized for chf; respiratory failure.   Past Medical History  Diagnosis Date  . Systolic CHF     Non ischemic, last Cath 09/21/08 - normal left main, LAD, LCx, ramus intermedius, RCA  . Hypertension   . Stroke   . Headache(784.0)   . Diabetes mellitus   . Dementia 05/27/2012  . Osteopenia     DEXA 2012 : T score hip -2.3, femur -2.1  . CHF (congestive heart failure)   . Shortness of breath   . Pacemaker   . Myocardial infarction 1991    [name] 12/21/2001  . 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 Automatic implantable cardioverter-defibrillator in situ     Past Surgical History  Procedure Laterality Date  . Insert / replace / remove pacemaker  12/15/2010  . Back surgery    . Abdominal hysterectomy    . Cardiac catheterization      [name] 12/22/2001  . Cardiac defibrillator placement  12/15/2010    [name] 12/15/2010    VITAL SIGNS BP 158/98  Pulse 64  Ht 5\' 5"  (1.651 m)  Wt 174 lb (78.926 kg)  BMI 28.96 kg/m2   Patient's Medications  New Prescriptions   No medications on file  Previous Medications   ALENDRONATE (FOSAMAX) 70 MG TABLET    Take 70 mg by mouth every Wednesday. Take with a full glass of water on an empty stomach.   ASPIRIN 325 MG TABLET    Take 325 mg by mouth daily.   ATORVASTATIN (LIPITOR) 40 MG TABLET    Take 40 mg by mouth daily.   CARVEDILOL (COREG) 3.125 MG TABLET    Take 1 tablet (3.125 mg total) by mouth 2 (two) times daily with a meal.   FUROSEMIDE (LASIX) 40 MG TABLET    Take 1 tablet (40 mg total) by mouth 2 (two)  times daily.   INSULIN DETEMIR (LEVEMIR) 100 UNIT/ML INJECTION    Inject 100 Units into the skin daily.   INSULIN LISPRO (HUMALOG) 100 UNIT/ML INJECTION    Inject 3 Units into the skin 3 (three) times daily before meals. For cbg >=150   LEVOFLOXACIN (LEVAQUIN) 750 MG TABLET    Take 1 tablet (750 mg total) by mouth every other day.   LISINOPRIL (PRINIVIL,ZESTRIL) 40 MG TABLET    Take 40 mg by mouth daily.   LORATADINE (CLARITIN) 10 MG TABLET    Take 1 tablet (10 mg total) by mouth daily as needed for allergies.   NICOTINE (NICODERM CQ - DOSED IN MG/24 HOURS) 21 MG/24HR PATCH    Place 1 patch (21 mg total) onto the skin daily.   NITROGLYCERIN (NITROSTAT) 0.4 MG SL TABLET    Place 1 tablet (0.4 mg total) under the tongue every 5 (five) minutes as needed for chest pain.  Modified Medications   No medications on file  Discontinued Medications   No medications on file    SIGNIFICANT DIAGNOSTIC EXAMS  03-24-13: chest x-ray: No change since the prior study. Chronic cardiomegaly and slight  pulmonary vascular prominence.   03-25-13: chest x-ray: Cardiomegaly.  No active disease.    LABS REVIEWED:   02-10-13: hgb a1c 8.5; chol 111; ldl 59; trig 105  03-24-13: wbc 5.9; hgb 12.4; hct 38.3; mcv 89.1 plt 223 ;glucose 208; bun 22; creat 1.06; k+3.9; na++137 03-26-13: wbc 7.2; hgb 12.8; hct 39.0; mcv 88.2; plt 223; glucose 205; bun 28; creat 1.09; k+5.1; na++135; liver normal albumin 3.9; tsh 4.257 03-30-13: glucose 175 bun 24; creat 1.22; k+4.7; na++135; albumin 3.2; phos 2.5  04-09-13; wbc 5.2; hgb 11.5; hct 38.3; mcv 89.5; plt 264; glucose 88; bun 19; creat 1.1; k+4.0; na++132     Review of Systems  Constitutional: Negative for malaise/fatigue.  Respiratory: Negative for cough and shortness of breath.   Cardiovascular: Negative for chest pain and palpitations.  Gastrointestinal: Negative for heartburn, abdominal pain and constipation.  Musculoskeletal: Negative for joint pain and myalgias.  Skin:  Negative.   Psychiatric/Behavioral: The patient is not nervous/anxious.        Physical Exam  Constitutional: She appears well-developed. No distress.  Overweight   Neck: Neck supple. No JVD present.  Cardiovascular: Normal rate, regular rhythm and intact distal pulses.   Respiratory: Effort normal and breath sounds normal. No respiratory distress. She has no wheezes.  GI: Soft. Bowel sounds are normal. She exhibits no distension. There is no tenderness.  Musculoskeletal: Normal range of motion. She exhibits no edema.    Neurological: She is alert.  Skin: Skin is warm and dry. She is not diaphoretic.      ASSESSMENT/ PLAN:  Will begin cozaar 25 mg daily  Will discharge to home with home health for pt/ot/nursing; will not need dme; her prescriptions have been written  Time spent with patient 40 minutes.     Synthia Innocenteborah Tashala Cumbo NP [facility]  Contact [phone] Monday through Friday 8am- 5pm  After hours call [phone]

## 2013-04-29 ENCOUNTER — Encounter (HOSPITAL_COMMUNITY): Payer: Self-pay | Admitting: Emergency Medicine

## 2013-04-29 ENCOUNTER — Emergency Department (HOSPITAL_COMMUNITY)
Admission: EM | Admit: 2013-04-29 | Discharge: 2013-04-29 | Disposition: A | Discharge status: Home / Self Care | Payer: Medicare Other | Attending: Emergency Medicine | Admitting: Emergency Medicine

## 2013-04-29 ENCOUNTER — Emergency Department (HOSPITAL_COMMUNITY): Payer: Medicare Other

## 2013-04-29 DIAGNOSIS — Z79899 Other long term (current) drug therapy: Secondary | ICD-10-CM | POA: Insufficient documentation

## 2013-04-29 DIAGNOSIS — Z7983 Long term (current) use of bisphosphonates: Secondary | ICD-10-CM | POA: Insufficient documentation

## 2013-04-29 DIAGNOSIS — R05 Cough: Secondary | ICD-10-CM | POA: Insufficient documentation

## 2013-04-29 DIAGNOSIS — I5020 Unspecified systolic (congestive) heart failure: Secondary | ICD-10-CM | POA: Insufficient documentation

## 2013-04-29 DIAGNOSIS — F0390 Unspecified dementia without behavioral disturbance: Secondary | ICD-10-CM | POA: Insufficient documentation

## 2013-04-29 DIAGNOSIS — Z794 Long term (current) use of insulin: Secondary | ICD-10-CM | POA: Insufficient documentation

## 2013-04-29 DIAGNOSIS — R0602 Shortness of breath: Secondary | ICD-10-CM

## 2013-04-29 DIAGNOSIS — R509 Fever, unspecified: Secondary | ICD-10-CM | POA: Insufficient documentation

## 2013-04-29 DIAGNOSIS — Z8673 Personal history of transient ischemic attack (TIA), and cerebral infarction without residual deficits: Secondary | ICD-10-CM | POA: Insufficient documentation

## 2013-04-29 DIAGNOSIS — F419 Anxiety disorder, unspecified: Secondary | ICD-10-CM

## 2013-04-29 DIAGNOSIS — Z792 Long term (current) use of antibiotics: Secondary | ICD-10-CM | POA: Insufficient documentation

## 2013-04-29 DIAGNOSIS — Z9889 Other specified postprocedural states: Secondary | ICD-10-CM | POA: Insufficient documentation

## 2013-04-29 DIAGNOSIS — F411 Generalized anxiety disorder: Secondary | ICD-10-CM | POA: Insufficient documentation

## 2013-04-29 DIAGNOSIS — F17200 Nicotine dependence, unspecified, uncomplicated: Secondary | ICD-10-CM | POA: Insufficient documentation

## 2013-04-29 DIAGNOSIS — Z7982 Long term (current) use of aspirin: Secondary | ICD-10-CM | POA: Insufficient documentation

## 2013-04-29 DIAGNOSIS — R059 Cough, unspecified: Secondary | ICD-10-CM | POA: Insufficient documentation

## 2013-04-29 DIAGNOSIS — M949 Disorder of cartilage, unspecified: Secondary | ICD-10-CM

## 2013-04-29 DIAGNOSIS — M899 Disorder of bone, unspecified: Secondary | ICD-10-CM | POA: Insufficient documentation

## 2013-04-29 DIAGNOSIS — Z95810 Presence of automatic (implantable) cardiac defibrillator: Secondary | ICD-10-CM | POA: Insufficient documentation

## 2013-04-29 DIAGNOSIS — I252 Old myocardial infarction: Secondary | ICD-10-CM | POA: Insufficient documentation

## 2013-04-29 DIAGNOSIS — I10 Essential (primary) hypertension: Secondary | ICD-10-CM

## 2013-04-29 DIAGNOSIS — E119 Type 2 diabetes mellitus without complications: Secondary | ICD-10-CM | POA: Insufficient documentation

## 2013-04-29 LAB — BASIC METABOLIC PANEL
BUN: 20 mg/dL (ref 6–23)
CO2: 19 mEq/L (ref 19–32)
Calcium: 10 mg/dL (ref 8.4–10.5)
Chloride: 101 mEq/L (ref 96–112)
Creatinine, Ser: 1 mg/dL (ref 0.50–1.10)
GFR calc Af Amer: 63 mL/min — ABNORMAL LOW (ref >90)
GFR calc non Af Amer: 55 mL/min — ABNORMAL LOW (ref >90)
Glucose, Bld: 106 mg/dL — ABNORMAL HIGH (ref 70–99)
Potassium: 3.4 mEq/L — ABNORMAL LOW (ref 3.7–5.3)
Sodium: 137 mEq/L (ref 137–147)

## 2013-04-29 LAB — CBC
HCT: 39.1 % (ref 36.0–46.0)
Hemoglobin: 12.7 g/dL (ref 12.0–15.0)
MCH: 28.3 pg (ref 26.0–34.0)
MCHC: 32.5 g/dL (ref 30.0–36.0)
MCV: 87.1 fL (ref 78.0–100.0)
Platelets: 220 10*3/uL (ref 150–400)
RBC: 4.49 MIL/uL (ref 3.87–5.11)
RDW: 15.8 % — ABNORMAL HIGH (ref 11.5–15.5)
WBC: 6 10*3/uL (ref 4.0–10.5)

## 2013-04-29 LAB — PRO B NATRIURETIC PEPTIDE: Pro B Natriuretic peptide (BNP): 3499 pg/mL — ABNORMAL HIGH (ref 0–125)

## 2013-04-29 LAB — TROPONIN I: Troponin I: <0.3 ng/mL (ref <0.30)

## 2013-04-29 NOTE — Progress Notes (Signed)
CSW spoke with pt.'s daughter, [name] and who will transport pt back home.   [address] [place] [phone]

## 2013-04-29 NOTE — Discharge Instructions (Signed)
Today you were evaluated for high blood pressure and shortness of breath. No emergent process was found to be taking place at this time.  It is important to take all prescription medication as prescribed and follow up with your family doctor for ongoing healthcare needs. Be sure to also follow up with cardiology as scheduled.  Return to ER for NEW or worsening/concerning symptoms.

## 2013-04-29 NOTE — ED Provider Notes (Signed)
CSN: [id number]     Arrival date & time 04/29/13  1106 History   First MD Initiated Contact with Patient 04/29/13 1111     Chief Complaint  Patient presents with  . Hypertension  . Anxiety  . Shortness of Breath     (Consider location/radiation/quality/duration/timing/severity/associated sxs/prior Treatment) HPI Pt is a 73yo female with hx of systolic CHF, HTN, stroke, DM, mild dementia, SOB, automatic implantable cardioverter-defibrillator, and anxiety brought to ED via EMS from home c/o SOB and elevated BP, 200/130. Pt states symptoms started this morning, causing her to "pant" trying to catch her breath. Pt took 1 nitro around 8am which helped some with symptoms. States she also took her BP medication as prescribed this morning. Pt denies chest pain, diaphoresis or nausea. Reports recent illness with cough and congestion. Reports hot and cold chills. Denies n/v/d.   Past Medical History  Diagnosis Date  . Systolic CHF     Non ischemic, last Cath 09/21/08 - normal left main, LAD, LCx, ramus intermedius, RCA  . Hypertension   . Stroke   . Headache(784.0)   . Diabetes mellitus   . Dementia 05/27/2012  . Osteopenia     DEXA 2012 : T score hip -2.3, femur -2.1  . CHF (congestive heart failure)   . Shortness of breath   . Pacemaker   . Myocardial infarction 1991    [name] 12/21/2001  . 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 Automatic implantable cardioverter-defibrillator in situ    Past Surgical History  Procedure Laterality Date  . Insert / replace / remove pacemaker  12/15/2010  . Back surgery    . Abdominal hysterectomy    . Cardiac catheterization      [name] 12/22/2001  . Cardiac defibrillator placement  12/15/2010    [name] 12/15/2010   No family history on file. History  Substance Use Topics  . Smoking status: Current Every Day Smoker    Types: Cigarettes  . Smokeless tobacco: Never Used  . Alcohol Use: No   OB History   Grav  Para Term Preterm Abortions TAB SAB Ect Mult Living                 Review of Systems  Constitutional: Positive for fever ( "hot and cold chills") and chills.  Respiratory: Positive for cough and shortness of breath.   Cardiovascular: Negative for chest pain, palpitations and leg swelling.  Gastrointestinal: Negative for nausea, vomiting and abdominal pain.  All other systems reviewed and are negative.     Allergies  Review of patient's allergies indicates no known allergies.  Home Medications   Prior to Admission medications   Medication Sig Start Date End Date Taking? Authorizing Provider  alendronate (FOSAMAX) 70 MG tablet Take 70 mg by mouth every Wednesday. Take with a full glass of water on an empty stomach.    Historical Provider, MD  aspirin 325 MG tablet Take 325 mg by mouth daily.    Historical Provider, MD  atorvastatin (LIPITOR) 40 MG tablet Take 40 mg by mouth daily.    Historical Provider, MD  carvedilol (COREG) 3.125 MG tablet Take 1 tablet (3.125 mg total) by mouth 2 (two) times daily with a meal. 02/06/13   Evelena Peat, DO  furosemide (LASIX) 40 MG tablet Take 1 tablet (40 mg total) by mouth 2 (two) times daily. 02/14/13   Rodolph Bong, MD  insulin detemir (LEVEMIR) 100 UNIT/ML injection Inject 100 Units into the skin  daily.    Historical Provider, MD  insulin lispro (HUMALOG) 100 UNIT/ML injection Inject 3 Units into the skin 3 (three) times daily before meals. For cbg >=150    Historical Provider, MD  levofloxacin (LEVAQUIN) 750 MG tablet Take 1 tablet (750 mg total) by mouth every other day. 03/30/13   Maryann Mikhail, DO  lisinopril (PRINIVIL,ZESTRIL) 40 MG tablet Take 40 mg by mouth daily.    Historical Provider, MD  loratadine (CLARITIN) 10 MG tablet Take 1 tablet (10 mg total) by mouth daily as needed for allergies. 04/09/13   Sharee Holster, NP  nicotine (NICODERM CQ - DOSED IN MG/24 HOURS) 21 mg/24hr patch Place 1 patch (21 mg total) onto the skin daily.  04/09/13   Sharee Holster, NP  nitroGLYCERIN (NITROSTAT) 0.4 MG SL tablet Place 1 tablet (0.4 mg total) under the tongue every 5 (five) minutes as needed for chest pain. 02/14/13   Rodolph Bong, MD   BP 169/110  Pulse 106  Temp(Src) 98.1 F (36.7 C) (Oral)  Resp 20  SpO2 100% Physical Exam  Nursing note and vitals reviewed. Constitutional: She is oriented to person, place, and time. She appears well-developed and well-nourished. No distress.  Pt appears well, NAD  HENT:  Head: Normocephalic and atraumatic.  Eyes: Conjunctivae are normal. No scleral icterus.  Neck: Normal range of motion.  Cardiovascular: Normal rate, regular rhythm and normal heart sounds.   Pulmonary/Chest: Effort normal and breath sounds normal. No respiratory distress. She has no wheezes. She has no rales. She exhibits no tenderness.  Lungs: CTAB, no respiratory distress.  Abdominal: Soft. Bowel sounds are normal. She exhibits no distension and no mass. There is no tenderness. There is no rebound and no guarding.  Musculoskeletal: Normal range of motion.  Neurological: She is alert and oriented to person, place, and time.  Slow to respond but answers appropriately  Skin: Skin is warm and dry. She is not diaphoretic.    ED Course  Procedures (including critical care time) Labs Review Labs Reviewed  CBC - Abnormal; Notable for the following:    RDW 15.8 (*)    All other components within normal limits  BASIC METABOLIC PANEL - Abnormal; Notable for the following:    Potassium 3.4 (*)    Glucose, Bld 106 (*)    GFR calc non Af Amer 55 (*)    GFR calc Af Amer 63 (*)    All other components within normal limits  PRO B NATRIURETIC PEPTIDE - Abnormal; Notable for the following:    Pro B Natriuretic peptide (BNP) 3499.0 (*)    All other components within normal limits  TROPONIN I    Imaging Review Dg Chest Portable 1 View  04/29/2013   CLINICAL DATA:  Hypertension, shortness of breath.  EXAM: PORTABLE  CHEST - 1 VIEW  COMPARISON:  03/25/2013  FINDINGS: Left AICD remains in place, unchanged. Cardiomegaly. Mild vascular congestion without overt edema. Linear areas of scarring or atelectasis in the lung bases. No effusions. No acute bony abnormality.  IMPRESSION: Cardiomegaly.  Bibasilar scarring or atelectasis.   Electronically Signed   By: Charlett Nose M.D.   On: 04/29/2013 12:32     EKG Interpretation None      MDM   Final diagnoses:  HTN (hypertension)  Anxiety  SOB (shortness of breath)    Pt is a 73yo female hx of HTN and anxiety presenting to ED c/o SOB. Elevated BP upon arrival to ED.  Pt stated it  felt like she was having a panic attack earlier, improved after taking 1 nitro around 8am.  Denies chest pain.  Labs: elevated BNP, however lower than previous labs.  Pt denies increased leg swelling or weight gain.  Lungs: CTAB.  Troponin: negative. CXR: cardiomegaly, bibasilar scarring or atelectasis.  Not concerned for emergent process taking place, including PE, ACS, or acute exacerbation of CHF.  Discussed pt with Dr. Rubin PayorPickering.  BP improved with pt in ED.  Pt may be discharged home to f/u with PCP and cardiology as needed. Return precautions provided. Pt verbalized understanding and agreement with tx plan.     Junius Finnerrin O'Malley, PA-C 04/29/13 1540

## 2013-04-29 NOTE — ED Notes (Signed)
Spoke with daughter on the phone, Helmut Muster, she is home. Will call [facility] to take patient home.

## 2013-04-29 NOTE — ED Notes (Signed)
Per ems, pt from home. Hx of HTN. Pt c/o intermittent SOB, hx of anxiety. By 200/130. Last pressure was in the 160s. Pt is AAOX4.

## 2013-04-29 NOTE — ED Notes (Signed)
O'Mally, MD notified re: BP, pt to be discharged as planned

## 2013-05-01 ENCOUNTER — Telehealth: Payer: Self-pay | Admitting: Cardiovascular Disease

## 2013-05-01 ENCOUNTER — Encounter: Payer: Self-pay | Admitting: Cardiovascular Disease

## 2013-05-01 NOTE — Telephone Encounter (Signed)
05-01-13 PT HAD DEFIB CK AT HOSPITAL 12-24-12, PAST DUE WITH DEFIB CK  WITH DR C/MT

## 2013-05-01 NOTE — ED Provider Notes (Signed)
Medical screening examination/treatment/procedure(s) were performed by non-physician practitioner and as supervising physician I was immediately available for consultation/collaboration.   EKG Interpretation None       Dodie Parisi R. Arda Keadle, MD 05/01/13 0714 

## 2013-05-12 ENCOUNTER — Other Ambulatory Visit: Payer: Self-pay | Admitting: Adult Health

## 2013-06-09 ENCOUNTER — Other Ambulatory Visit: Payer: Self-pay | Admitting: Adult Health

## 2013-06-09 ENCOUNTER — Other Ambulatory Visit: Payer: Self-pay | Admitting: Internal Medicine

## 2013-06-27 ENCOUNTER — Other Ambulatory Visit: Payer: Self-pay | Admitting: Adult Health

## 2013-06-27 ENCOUNTER — Other Ambulatory Visit: Payer: Self-pay | Admitting: Internal Medicine

## 2013-07-01 ENCOUNTER — Ambulatory Visit (INDEPENDENT_AMBULATORY_CARE_PROVIDER_SITE_OTHER): Payer: Medicare Other | Admitting: Cardiovascular Disease

## 2013-07-01 ENCOUNTER — Encounter: Payer: Self-pay | Admitting: Cardiovascular Disease

## 2013-07-01 VITALS — BP 130/80 | HR 76 | Ht 64.0 in | Wt 179.1 lb

## 2013-07-01 DIAGNOSIS — E785 Hyperlipidemia, unspecified: Secondary | ICD-10-CM | POA: Insufficient documentation

## 2013-07-01 DIAGNOSIS — I6350 Cerebral infarction due to unspecified occlusion or stenosis of unspecified cerebral artery: Secondary | ICD-10-CM

## 2013-07-01 DIAGNOSIS — I5043 Acute on chronic combined systolic (congestive) and diastolic (congestive) heart failure: Secondary | ICD-10-CM

## 2013-07-01 DIAGNOSIS — I509 Heart failure, unspecified: Secondary | ICD-10-CM

## 2013-07-01 MED ORDER — CARVEDILOL 6.25 MG PO TABS: ORAL_TABLET | ORAL | Status: DC

## 2013-07-01 MED ORDER — ISOSORBIDE MONONITRATE ER 60 MG PO TB24: 60.0000 mg | ORAL_TABLET | Freq: Every day | ORAL | Status: DC

## 2013-07-01 NOTE — Assessment & Plan Note (Signed)
History of cardiac catheterization performed by myself 09/22/08. Her most recent 2-D echo performed February of this year we'll with an EF of 25% with moderate MR, TR and pulmonary hypertension. She has had some episodes of atypical chest pain but is no longer on her long-acting neuroleptics which she had been on in the past. She is also known hydralazine or spironolactone. Good adjust her medications. She has a BiV ICD  ICD in place and has not had this interrogated  for over 2 years. We will refer her back to the device clinic and ultimately back to Dr. Royann Shivers

## 2013-07-01 NOTE — Assessment & Plan Note (Signed)
On statin therapy with recent lipid profile performed 02/10/13 revealing a cholesterol of 111, LDL of 59 and HDL of 31

## 2013-07-01 NOTE — Progress Notes (Signed)
07/01/2013 Paula [name]   [birth year]  [id number]  Primary Physician [name],[name], MD Primary Cardiologist: Runell GessJonathan J. Berry MD Roseanne RenoFACP,FACC,FAHA, FSCAI   HPI:  The patient is a 73 year old, mildly overweight, divorced [place] female, mother of 4, grandmother to 4 grandchildren who is accompanied by 1 of her daughters today. I last saw her in the office 09/18/10.. She has a history of nonischemic cardiomyopathy status post cath by myself July 21, 2008, revealing an EF in the 20% range with normal coronary arteries. She has hypertension, hyperlipidemia, and non-insulin requiring diabetes. I referred her to Dr. Royann Shiversroitoru on August 29, 2010, who talked to her about ICD therapy for primary prevention of sudden death. She ultimately had a BiV-ICD implanted and was followed by Dr. Royann Shiversroitoru up until 2013 after which she failed to return for followup. She also has a mild degree of dementia. Her blood pressure has been very difficult to control. She was hospitalized earlier this year with a stroke. She does complain of some atypical chest pain. When reviewing her current medications that he is significantly different than when I last saw her.     Current Outpatient Prescriptions  Medication Sig Dispense Refill  . alendronate (FOSAMAX) 70 MG tablet TAKE 1 TABLET BY MOUTH EVERY WEEK AS DIRECTED  4 tablet  0  . amLODipine (NORVASC) 10 MG tablet Take 10 mg by mouth daily.      Marland Kitchen. atorvastatin (LIPITOR) 40 MG tablet Take 40 mg by mouth daily.      . carvedilol (COREG) 6.25 MG tablet TAKE 1 TABLET TWICE A DAY  60 tablet  11  . furosemide (LASIX) 40 MG tablet Take 1 tablet (40 mg total) by mouth 2 (two) times daily.  62 tablet  0  . insulin detemir (LEVEMIR) 100 UNIT/ML injection Inject 100 Units into the skin daily.      . insulin lispro (HUMALOG) 100 UNIT/ML injection Inject 3 Units into the skin 3 (three) times daily before meals. For cbg >=150      . lisinopril (PRINIVIL,ZESTRIL) 40 MG  tablet Take 40 mg by mouth daily.      Marland Kitchen. loratadine (CLARITIN) 10 MG tablet Take 1 tablet (10 mg total) by mouth daily as needed for allergies.  30 tablet  11  . losartan (COZAAR) 25 MG tablet TAKE 1 TABLET EVERY DAY  30 tablet  0  . nitroGLYCERIN (NITROSTAT) 0.4 MG SL tablet Place 1 tablet (0.4 mg total) under the tongue every 5 (five) minutes as needed for chest pain.  15 tablet  0  . isosorbide mononitrate (IMDUR) 60 MG 24 hr tablet Take 1 tablet (60 mg total) by mouth daily.  30 tablet  11   No current facility-administered medications for this visit.    No Known Allergies  History   Social History  . Marital Status: Single    Spouse Name: N/A    Number of Children: N/A  . Years of Education: N/A   Occupational History  . Not on file.   Social History Main Topics  . Smoking status: Current Every Day Smoker    Types: Cigarettes  . Smokeless tobacco: Never Used  . Alcohol Use: No  . Drug Use: No  . Sexual Activity: No   Other Topics Concern  . Not on file   Social History Narrative  . No narrative on file     Review of Systems: General: negative for chills, fever, night sweats or weight changes.  Cardiovascular: negative  for chest pain, dyspnea on exertion, edema, orthopnea, palpitations, paroxysmal nocturnal dyspnea or shortness of breath Dermatological: negative for rash Respiratory: negative for cough or wheezing Urologic: negative for hematuria Abdominal: negative for nausea, vomiting, diarrhea, bright red blood per rectum, melena, or hematemesis Neurologic: negative for visual changes, syncope, or dizziness All other systems reviewed and are otherwise negative except as noted above.    Blood pressure 130/80, pulse 76, height 5\' 4"  (1.626 m), weight 179 lb 1.6 oz (81.239 kg).  General appearance: alert and no distress Neck: no adenopathy, no carotid bruit, no JVD, supple, symmetrical, trachea midline and thyroid not enlarged, symmetric, no  tenderness/mass/nodules Lungs: clear to auscultation bilaterally Heart: regular rate and rhythm, S1, S2 normal, no murmur, click, rub or gallop Extremities: trace lower extremity edema  EKG normal sinus rhythm at 76 with frequent PVCs and occasional paced beats, nonspecific ST and T-wave changes with an incomplete left bundle-branch block  ASSESSMENT AND PLAN:   HTN (hypertension) Controlled on current medications  Hyperlipidemia On statin therapy with recent lipid profile performed 02/10/13 revealing a cholesterol of 111, LDL of 59 and HDL of 31  Systolic and diastolic CHF, acute on chronic History of cardiac catheterization performed by myself 09/22/08. Her most recent 2-D echo performed February of this year we'll with an EF of 25% with moderate MR, TR and pulmonary hypertension. She has had some episodes of atypical chest pain but is no longer on her long-acting neuroleptics which she had been on in the past. She is also known hydralazine or spironolactone. Good adjust her medications. She has a BiV ICD  ICD in place and has not had this interrogated  for over 2 years. We will refer her back to the device clinic and ultimately back to Dr. Royann Shivers      Runell Gess MD [place], [facility] 07/01/2013 8:44 AM

## 2013-07-01 NOTE — Assessment & Plan Note (Signed)
Controlled on current medications 

## 2013-07-01 NOTE — Patient Instructions (Signed)
  We will see you back in follow up in 3 months with an extender and 6 months with Dr Allyson Sabal.   Dr Allyson Sabal has ordered : 1.Medication changes-Start: Imdur 60mg  daily                                       Stop: n/a                                        Increase: Coreg to 6.25mg  daily                                       Decrease: n/a  2. We will make an appointment for you to have your pacemaker checked and to have a follow up with Dr Royann Shivers

## 2013-07-11 ENCOUNTER — Other Ambulatory Visit: Payer: Self-pay | Admitting: Internal Medicine

## 2013-08-03 ENCOUNTER — Other Ambulatory Visit: Payer: Self-pay | Admitting: Adult Health

## 2013-08-04 ENCOUNTER — Other Ambulatory Visit: Payer: Self-pay | Admitting: Adult Health

## 2013-08-10 ENCOUNTER — Other Ambulatory Visit: Payer: Self-pay | Admitting: Adult Health

## 2013-08-14 ENCOUNTER — Encounter: Payer: Self-pay | Admitting: *Deleted

## 2013-08-21 ENCOUNTER — Encounter: Payer: Medicare Other | Admitting: Cardiovascular Disease

## 2013-08-21 ENCOUNTER — Ambulatory Visit (INDEPENDENT_AMBULATORY_CARE_PROVIDER_SITE_OTHER): Payer: Medicare Other | Admitting: Cardiovascular Disease

## 2013-08-21 ENCOUNTER — Encounter: Payer: Self-pay | Admitting: Cardiovascular Disease

## 2013-08-21 VITALS — BP 136/86 | HR 76 | Resp 16 | Ht 64.0 in | Wt 186.3 lb

## 2013-08-21 DIAGNOSIS — I480 Paroxysmal atrial fibrillation: Secondary | ICD-10-CM

## 2013-08-21 DIAGNOSIS — I509 Heart failure, unspecified: Secondary | ICD-10-CM

## 2013-08-21 DIAGNOSIS — I4891 Unspecified atrial fibrillation: Secondary | ICD-10-CM

## 2013-08-21 DIAGNOSIS — I5043 Acute on chronic combined systolic (congestive) and diastolic (congestive) heart failure: Secondary | ICD-10-CM

## 2013-08-21 DIAGNOSIS — I6350 Cerebral infarction due to unspecified occlusion or stenosis of unspecified cerebral artery: Secondary | ICD-10-CM

## 2013-08-21 DIAGNOSIS — Z95810 Presence of automatic (implantable) cardiac defibrillator: Secondary | ICD-10-CM

## 2013-08-21 LAB — MDC_IDC_ENUM_SESS_TYPE_INCLINIC
Battery Voltage: 3.12 V
Brady Statistic AP VP Percent: 0.03 %
Brady Statistic AS VP Percent: 0.04 %
Brady Statistic RA Percent Paced: 15.74 %
HIGH POWER IMPEDANCE MEASURED VALUE: 399 Ohm
HIGH POWER IMPEDANCE MEASURED VALUE: 53 Ohm
HighPow Impedance: 228 Ohm
HighPow Impedance: 44 Ohm
Lead Channel Impedance Value: 475 Ohm
Lead Channel Pacing Threshold Amplitude: 0.75 V
Lead Channel Pacing Threshold Pulse Width: 0.4 ms
Lead Channel Pacing Threshold Pulse Width: 0.4 ms
Lead Channel Sensing Intrinsic Amplitude: 2.625 mV
Lead Channel Sensing Intrinsic Amplitude: 31.625 mV
Lead Channel Setting Pacing Amplitude: 2 V
Lead Channel Setting Pacing Amplitude: 2.5 V
Lead Channel Setting Pacing Pulse Width: 0.4 ms
Lead Channel Setting Sensing Sensitivity: 0.3 mV
MDC IDC MSMT LEADCHNL RA PACING THRESHOLD AMPLITUDE: 0.75 V
MDC IDC MSMT LEADCHNL RA SENSING INTR AMPL: 1.875 mV
MDC IDC MSMT LEADCHNL RV IMPEDANCE VALUE: 513 Ohm
MDC IDC MSMT LEADCHNL RV SENSING INTR AMPL: 31.625 mV
MDC IDC SESS DTM: 20150821145506
MDC IDC SET ZONE DETECTION INTERVAL: 300 ms
MDC IDC SET ZONE DETECTION INTERVAL: 350 ms
MDC IDC SET ZONE DETECTION INTERVAL: 360 ms
MDC IDC STAT BRADY AP VS PERCENT: 15.71 %
MDC IDC STAT BRADY AS VS PERCENT: 84.22 %
MDC IDC STAT BRADY RV PERCENT PACED: 0.07 %
Zone Setting Detection Interval: 370 ms

## 2013-08-21 NOTE — Patient Instructions (Addendum)
Your physician recommends that you weigh, daily, at the same time every day, and in the same amount of clothing. Please record your daily weights on the handout provided and bring it to your next appointment.  Call if you have a 3 lb. Weight gain over night or 5 lbs in a week.   Remote monitoring is used to monitor your  ICD from home. This monitoring reduces the number of office visits required to check your device to one time per year. It allows [place] to keep an eye on the functioning of your device to ensure it is working properly. You are scheduled for a device check from home on 11-23-2013. You may send your transmission at any time that day. If you have a wireless device, the transmission will be sent automatically. After your physician reviews your transmission, you will receive a postcard with your next transmission date.  Your physician recommends that you schedule a follow-up appointment in: 12 months with Dr.Croitoru   You will receive a WireX cellular adaptor in within the next three months to connect to your [employer] monitor to send in further transmissions. This adaptor allows you to bypass your home phone connection and send in the transmissions using your nearby cell towers. Once the adaptor and the monitor are connected the process of sending the transmissions is the exact same. It is encouraged that you call the office the first time to ensure that the transmission has been received.

## 2013-08-23 DIAGNOSIS — I480 Paroxysmal atrial fibrillation: Secondary | ICD-10-CM | POA: Insufficient documentation

## 2013-08-23 DIAGNOSIS — Z95810 Presence of automatic (implantable) cardiac defibrillator: Secondary | ICD-10-CM | POA: Insufficient documentation

## 2013-08-23 NOTE — Progress Notes (Signed)
Patient ID: Paula [name], female   DOB: [birth year], 73 y.o.   MRN: [id number]      Reason for office visit ICD followup  Mrs. Cazarez has severe nonischemic cardiomyopathy and is followed by Dr. Nanetta Batty. She has been lost to office device followup for a couple of years, although her device has been checked during recent hospitalizations, most recently in March. She has been in the hospital for a stroke and congestive heart failure and then in a nursing home for a good part of the year. Her ICD Optivol index shows clear evidence of persistent hypervolemia for most of the year, only recently resolved.  Her defibrillator is a Medtronic protecta XT DR implanted in December 2012. All the lead and battery parameters checked today are normal. There has been no evidence of ventricular arrhythmia or atrial fibrillation since the last device check. She is very sedentary. She requires atrial pacing roughly 16% of the time but never paces the ventricle. Her thoracic impedance was decreased, consistent with acute heart failure from December 2014 through July 2015 but has been normal over the last month or so.  Her defibrillator log reports an episode of atrial tachycardia/atrial fibrillation that reportedly occurred in late February, around the time of her "stroke". Unfortunately the electrograms are not available for review. We'll try to retrieve these older episodes. There was no identified carotid cause for her stroke.  She has mild dementia and obtaining detailed history and review of systems is a challenge.   No Known Allergies  Current Outpatient Prescriptions  Medication Sig Dispense Refill  . alendronate (FOSAMAX) 70 MG tablet TAKE 1 TABLET BY MOUTH EVERY WEEK AS DIRECTED  4 tablet  0  . amLODipine (NORVASC) 10 MG tablet Take 10 mg by mouth daily.      Marland Kitchen atorvastatin (LIPITOR) 40 MG tablet Take 40 mg by mouth daily.      . carvedilol (COREG) 6.25 MG tablet TAKE 1 TABLET TWICE A DAY   60 tablet  11  . furosemide (LASIX) 40 MG tablet Take 1 tablet (40 mg total) by mouth 2 (two) times daily.  62 tablet  0  . insulin detemir (LEVEMIR) 100 UNIT/ML injection Inject 100 Units into the skin daily.      . insulin lispro (HUMALOG) 100 UNIT/ML injection Inject 3 Units into the skin 3 (three) times daily before meals. For cbg >=150      . isosorbide mononitrate (IMDUR) 60 MG 24 hr tablet Take 1 tablet (60 mg total) by mouth daily.  30 tablet  11  . lisinopril (PRINIVIL,ZESTRIL) 40 MG tablet Take 40 mg by mouth daily.      Marland Kitchen loratadine (CLARITIN) 10 MG tablet Take 1 tablet (10 mg total) by mouth daily as needed for allergies.  30 tablet  11  . losartan (COZAAR) 25 MG tablet TAKE 1 TABLET EVERY DAY  30 tablet  0  . nitroGLYCERIN (NITROSTAT) 0.4 MG SL tablet Place 1 tablet (0.4 mg total) under the tongue every 5 (five) minutes as needed for chest pain.  15 tablet  0   No current facility-administered medications for this visit.    Past Medical History  Diagnosis Date  . Systolic CHF     non-ischemic, last cath 09/21/08 - normal left main, LAD, LCx, ramus intermedius, RCA  . Hypertension   . Stroke   . Headache(784.0)   . Diabetes mellitus   . Dementia 05/27/2012  . Osteopenia     DEXA 2012 : T  score hip -2.3, femur -2.1  . CHF (congestive heart failure)     nonischemic cardiopathy, EF 25% by 2-D echo February 2015  . Shortness of breath   . 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 Royann Shivers  . Myocardial infarction 1991    [name] 12/21/2001  . 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 Automatic implantable cardioverter-defibrillator in situ   . Hyperlipidemia     Past Surgical History  Procedure Laterality Date  . Back surgery    . Abdominal hysterectomy    . Cardiac catheterization      [name] 12/22/2001  . Cardiac defibrillator placement  12/15/2010    dual-chamber - Medtronic  Proteca (Dr. Judie Petit. Dinesh Ulysse)  . Transthoracic echocardiogram  05/2010    EF 20-25%, mod conc hypertrophy, grade 3 diastolic dysfunction, elevated LVEDP; dilated MV annulus with mod regurg; LA severely dilated; RV mildly dilated; RA mod-severely dilated; mild-mod TR; PA peak pressure ; mod pulm hypertension  . Transthoracic echocardiogram  02/10/2013    EF 25%, mild AV regurg; LA mod dialted; RA mildly dilated; mod TR; PA peak  . Nm myocar perf wall motion  08/2008    lexiscan - diaphragmatic attenuation of inferior wall, EF 37%  . Cardiac catheterization  12/21/2001    normal LAD, Cfx w/prox luminal irregularities, RCA dominant with diffuse luminal irregularities (Dr. Daiva Nakayama)  . Cardiac catheterization  09/21/2008    normal left main/LAD/Cfx/RCA, ramus intermedius is large and normal (Dr. Erlene Quan)    No family history on file.  History   Social History  . Marital Status: Single    Spouse Name: N/A    Number of Children: N/A  . Years of Education: 10   Occupational History  . Not on file.   Social History Main Topics  . Smoking status: Current Every Day Smoker    Types: Cigarettes  . Smokeless tobacco: Never Used  . Alcohol Use: No  . Drug Use: No  . Sexual Activity: No   Other Topics Concern  . Not on file   Social History Narrative  . No narrative on file    Review of systems: The patient specifically denies any chest pain at rest or with exertion, dyspnea at rest or with exertion, orthopnea, paroxysmal nocturnal dyspnea, syncope, palpitations, focal neurological deficits, intermittent claudication, lower extremity edema, unexplained weight gain, cough, hemoptysis or wheezing.  The patient also denies abdominal pain, nausea, vomiting, dysphagia, diarrhea, constipation, polyuria, polydipsia, dysuria, hematuria, frequency, urgency, abnormal bleeding or bruising, fever, chills, unexpected weight changes, mood swings, change in skin or hair texture, change in  voice quality, auditory or visual problems, allergic reactions or rashes, new musculoskeletal complaints other than usual "aches and pains".    PHYSICAL EXAM BP 136/86  Pulse 76  Resp 16  Ht 5\' 4"  (1.626 m)  Wt 186 lb 4.8 oz (84.505 kg)  BMI 31.96 kg/m2  General: Alert, oriented x3, no distress Head: no evidence of trauma, PERRL, EOMI, no exophtalmos or lid lag, no myxedema, no xanthelasma; normal ears, nose and oropharynx Neck: normal jugular venous pulsations and no hepatojugular reflux; brisk carotid pulses without delay and no carotid bruits Chest: clear to auscultation, no signs of consolidation by percussion or palpation, normal fremitus, symmetrical and full respiratory excursions; healthy left subclavian defibrillator site Cardiovascular: Inferolateral displacement of the apical impulse, regular rhythm, normal first and paradoxically split second heart sounds, no murmurs, rubs  or gallops Abdomen: no tenderness or distention, no masses by palpation, no abnormal pulsatility or arterial bruits, normal bowel sounds, no hepatosplenomegaly Extremities: no clubbing, cyanosis or edema; 2+ radial, ulnar and brachial pulses bilaterally; 2+ right femoral, posterior tibial and dorsalis pedis pulses; 2+ left femoral, posterior tibial and dorsalis pedis pulses; no subclavian or femoral bruits Neurological: grossly nonfocal   EKG: July 1, sinus rhythm with frequent PACs and PVCs, left bundle branch block  Lipid Panel     Component Value Date/Time   CHOL 111 02/10/2013 0721   TRIG 105 02/10/2013 0721   HDL 31* 02/10/2013 0721   CHOLHDL 3.6 02/10/2013 0721   VLDL 21 02/10/2013 0721   LDLCALC 59 02/10/2013 0721    BMET    Component Value Date/Time   NA 137 04/29/2013 1155   K 3.4* 04/29/2013 1155   CL 101 04/29/2013 1155   CO2 19 04/29/2013 1155   GLUCOSE 106* 04/29/2013 1155   BUN 20 04/29/2013 1155   CREATININE 1.00 04/29/2013 1155   CALCIUM 10.0 04/29/2013 1155   GFRNONAA 55* 04/29/2013 1155     GFRAA 63* 04/29/2013 1155     ASSESSMENT AND PLAN Systolic and diastolic CHF, acute on chronic Since her ICD implantation she has developed a left bundle branch block. Consideration should be given to upgrade to a CRT-D device. Her medications have only recently been readjusted to provide more appropriate heart failure treatment. Reevaluate the need for device upgrade at future appointments. Today she appears euvolemic but by clinical criteria and thoracic impedance index.  ICD (implantable cardioverter-defibrillator) in place Normal device function   Paroxysmal atrial fibrillation Should probably be able to retrieve older electrograms. If this diagnosis is confirmed, she should be on lifelong anticoagulation to prevent future recurrence of stroke. I think her cognitive challenges would make her a poor candidate for warfarin, but she might do well with adifferent anticoagulant.   Orders Placed This Encounter  Procedures  . Implantable device check   No orders of the defined types were placed in this encounter.    Junious Silk, MD, [facility] [phone] office [phone] pager

## 2013-08-23 NOTE — Assessment & Plan Note (Signed)
Normal device function.

## 2013-08-23 NOTE — Assessment & Plan Note (Addendum)
Since her ICD implantation she has developed a left bundle branch block. Consideration should be given to upgrade to a CRT-D device. Her medications have only recently been readjusted to provide more appropriate heart failure treatment. Reevaluate the need for device upgrade at future appointments. Today she appears euvolemic but by clinical criteria and thoracic impedance index.

## 2013-08-23 NOTE — Assessment & Plan Note (Signed)
Should probably be able to retrieve older electrograms. If this diagnosis is confirmed, she should be on lifelong anticoagulation to prevent future recurrence of stroke. I think her cognitive challenges would make her a poor candidate for warfarin, but she might do well with adifferent anticoagulant.

## 2013-08-24 ENCOUNTER — Other Ambulatory Visit: Payer: Self-pay | Admitting: Adult Health

## 2013-09-07 ENCOUNTER — Other Ambulatory Visit: Payer: Self-pay | Admitting: Adult Health

## 2013-09-30 ENCOUNTER — Encounter: Payer: Self-pay | Admitting: Cardiovascular Disease

## 2013-11-01 ENCOUNTER — Other Ambulatory Visit: Payer: Self-pay | Admitting: Internal Medicine

## 2013-11-23 ENCOUNTER — Telehealth: Payer: Self-pay | Admitting: Cardiology

## 2013-11-23 ENCOUNTER — Encounter: Payer: Medicare Other | Admitting: *Deleted

## 2013-11-23 NOTE — Telephone Encounter (Signed)
LMOVM reminding pt to send remote transmission.   

## 2013-11-24 ENCOUNTER — Encounter: Payer: Self-pay | Admitting: Cardiology

## 2013-12-09 ENCOUNTER — Encounter: Payer: Self-pay | Admitting: Cardiovascular Disease

## 2013-12-09 ENCOUNTER — Ambulatory Visit (INDEPENDENT_AMBULATORY_CARE_PROVIDER_SITE_OTHER): Payer: Medicare Other | Admitting: Cardiovascular Disease

## 2013-12-09 VITALS — BP 140/88 | HR 81 | Ht 65.0 in | Wt 195.3 lb

## 2013-12-09 DIAGNOSIS — I10 Essential (primary) hypertension: Secondary | ICD-10-CM

## 2013-12-09 DIAGNOSIS — E785 Hyperlipidemia, unspecified: Secondary | ICD-10-CM

## 2013-12-09 DIAGNOSIS — I639 Cerebral infarction, unspecified: Secondary | ICD-10-CM

## 2013-12-09 DIAGNOSIS — I480 Paroxysmal atrial fibrillation: Secondary | ICD-10-CM

## 2013-12-09 DIAGNOSIS — I5043 Acute on chronic combined systolic (congestive) and diastolic (congestive) heart failure: Secondary | ICD-10-CM

## 2013-12-09 DIAGNOSIS — Z95810 Presence of automatic (implantable) cardiac defibrillator: Secondary | ICD-10-CM

## 2013-12-09 DIAGNOSIS — I6350 Cerebral infarction due to unspecified occlusion or stenosis of unspecified cerebral artery: Secondary | ICD-10-CM

## 2013-12-09 MED ORDER — LOSARTAN POTASSIUM 100 MG PO TABS: 100.0000 mg | ORAL_TABLET | Freq: Every day | ORAL | Status: DC

## 2013-12-09 NOTE — Assessment & Plan Note (Signed)
History of normal coronary arteries, nonischemic myopathy with an ejection fraction of 25% by 2-D echo performed earlier this year. She appears to be euvolemic and denies symptoms of congestive heart failure. She does have an ICD. She apparently has developed a bundle branch block conduction abnormality and Dr. Royann Shivers raised the question whether or not she needs resynchronization therapy though at this point I do not see the need to upgrade to a biopsy device given her clinical stability.Marland Kitchen

## 2013-12-09 NOTE — Assessment & Plan Note (Signed)
The patient had an acute ischemic stroke earlier this year. Carotid Dopplers were unrevealing. Dr. Royann Shivers interrogated her ICD and found an episode of supraventricular tachycardia temporally related to her stroke raising the question of whether or not she had PAF. He was going to look at the electrograms but I do not see that this was done. Cognitively, I do not think she is a candidate for Coumadin anticoagulation and I question whether or not she should be on a NOAC

## 2013-12-09 NOTE — Progress Notes (Signed)
12/09/2013 Paula [name]   21-Jun-1940  [id number]  Primary Physician [name],[name], MD Primary Cardiologist: Runell GessJonathan J. Jenessa Gillingham MD Roseanne RenoFACP,FACC,FAHA, FSCAI  HPI:  The patient is a 73 year old, mildly overweight, divorced [place] female, mother of 4, grandmother to 4 grandchildren who is accompanied by 1 one of her caregivers.. I last saw her in the office 07/01/13.Marland Kitchen. She has a history of nonischemic cardiomyopathy status post cath by myself July 21, 2008, revealing an EF in the 20% range with normal coronary arteries. She has hypertension, hyperlipidemia, and non-insulin requiring diabetes. I referred her to Dr. Royann Shiversroitoru on August 29, 2010, who talked to her about ICD therapy for primary prevention of sudden death. She ultimately had a BiV-ICD implanted and was followed by Dr. Royann Shiversroitoru up until 2013 after which she failed to return for followup. She also has a mild degree of dementia. Her blood pressure has been very difficult to control. She was hospitalized earlier this year with a stroke. She denies chest pain or shortness of breath.. She saw Dr. Royann Shiversroitoru in August who interrogated her ICD which revealed an episode of supraventricular tachycardia earlier this year that was asymptomatic. He mentioned reviewing the electrograms however there is no further mention of this. There was a question of beginning her on an oral anticoagulant because of the possibility that this was an embolic stroke secondary to PAF. She does complain of a dry hacking cough.  Current Outpatient Prescriptions  Medication Sig Dispense Refill  . alendronate (FOSAMAX) 70 MG tablet TAKE 1 TABLET BY MOUTH EVERY WEEK AS DIRECTED 4 tablet 0  . amLODipine (NORVASC) 10 MG tablet Take 10 mg by mouth daily.    Marland Kitchen. atorvastatin (LIPITOR) 40 MG tablet Take 40 mg by mouth daily.    . carvedilol (COREG) 6.25 MG tablet TAKE 1 TABLET TWICE A DAY 60 tablet 11  . furosemide (LASIX) 40 MG tablet Take 1 tablet (40 mg total) by mouth  2 (two) times daily. 62 tablet 0  . insulin detemir (LEVEMIR) 100 UNIT/ML injection Inject 100 Units into the skin daily.    . insulin lispro (HUMALOG) 100 UNIT/ML injection Inject 3 Units into the skin 3 (three) times daily before meals. For cbg >=150    . isosorbide mononitrate (IMDUR) 60 MG 24 hr tablet Take 1 tablet (60 mg total) by mouth daily. 30 tablet 11  . loratadine (CLARITIN) 10 MG tablet Take 1 tablet (10 mg total) by mouth daily as needed for allergies. 30 tablet 11  . losartan (COZAAR) 100 MG tablet Take 1 tablet (100 mg total) by mouth daily. 30 tablet 6  . nitroGLYCERIN (NITROSTAT) 0.4 MG SL tablet Place 1 tablet (0.4 mg total) under the tongue every 5 (five) minutes as needed for chest pain. 15 tablet 0   No current facility-administered medications for this visit.    No Known Allergies  History   Social History  . Marital Status: Single    Spouse Name: N/A    Number of Children: N/A  . Years of Education: 10   Occupational History  . Not on file.   Social History Main Topics  . Smoking status: Current Every Day Smoker    Types: Cigarettes  . Smokeless tobacco: Never Used  . Alcohol Use: No  . Drug Use: No  . Sexual Activity: No   Other Topics Concern  . Not on file   Social History Narrative     Review of Systems: General: negative for chills, fever, night  sweats or weight changes.  Cardiovascular: negative for chest pain, dyspnea on exertion, edema, orthopnea, palpitations, paroxysmal nocturnal dyspnea or shortness of breath Dermatological: negative for rash Respiratory: negative for cough or wheezing Urologic: negative for hematuria Abdominal: negative for nausea, vomiting, diarrhea, bright red blood per rectum, melena, or hematemesis Neurologic: negative for visual changes, syncope, or dizziness All other systems reviewed and are otherwise negative except as noted above.    Blood pressure 140/88, pulse 81, height 5\' 5"  (1.651 m), weight 195 lb  4.8 oz (88.587 kg).  General appearance: alert and no distress Neck: no adenopathy, no carotid bruit, no JVD, supple, symmetrical, trachea midline and thyroid not enlarged, symmetric, no tenderness/mass/nodules Lungs: clear to auscultation bilaterally Heart: regular rate and rhythm, S1, S2 normal, no murmur, click, rub or gallop Extremities: extremities normal, atraumatic, no cyanosis or edema  EKG atrial paced rhythm with a left bundle-branch block. I personally reviewed this EKG  ASSESSMENT AND PLAN:   HTN (hypertension) History of hypertension with blood pressure measured today at 140/88. She is on amlodipine 10 mg a day carvedilol 6.25 mg twice a day, lisinopril 40 mg a day and losartan 25 mg a day. She does complain of a dry hacking cough. I'm going to stop her lisinopril and increase her losartan from 2520 mg a day. She will keep a blood pressure log daily for the next month and will see Belenda Cruise back for follow-up.  Acute ischemic stroke The patient had an acute ischemic stroke earlier this year. Carotid Dopplers were unrevealing. Dr. Royann Shivers interrogated her ICD and found an episode of supraventricular tachycardia temporally related to her stroke raising the question of whether or not she had PAF. He was going to look at the electrograms but I do not see that this was done. Cognitively, I do not think she is a candidate for Coumadin anticoagulation and I question whether or not she should be on a NOAC  Systolic and diastolic CHF, acute on chronic History of normal coronary arteries, nonischemic myopathy with an ejection fraction of 25% by 2-D echo performed earlier this year. She appears to be euvolemic and denies symptoms of congestive heart failure. She does have an ICD. She apparently has developed a bundle branch block conduction abnormality and Dr. Royann Shivers raised the question whether or not she needs resynchronization therapy though at this point I do not see the need to upgrade to a  biopsy device given her clinical stability..  Paroxysmal atrial fibrillation Patient had a supra ventricular tachycardia documented on interrogation of her ICD by Dr. Royann Shivers that was brief. She is in sinus rhythm today. She did have what appeared to be an embolic stroke. Given her cognitive limitations are not sure she's best candidate for an oral anticoagulant.  Hyperlipidemia History of hyperlipidemia on atorvastatin 40 mg a day. Her most recent lipid profile performed 02/10/13 revealed a total cholesterol 111, LDL 59 and HDL of 31  ICD (implantable cardioverter-defibrillator) in place Followed by Dr. Royann Shivers.      Runell Gess MD FACP,FACC,FAHA, [facility] 12/09/2013 2:57 PM

## 2013-12-09 NOTE — Assessment & Plan Note (Signed)
Followed by Dr. Croitoru 

## 2013-12-09 NOTE — Patient Instructions (Signed)
Please make an appointment with Belenda Cruise, PharmD for a blood pressure check in 1 month.  We request that you follow-up in: 6 months with an extender and in 12 with Dr San Morelle will receive a reminder letter in the mail two months in advance. If you don't receive a letter, please call our office to schedule the follow-up appointment.  STOP taking Lisinopril.  INCREASE your Losartan to 100 MG daily (new prescription sent to preferred pharmacy).   Blood Pressure Record Sheet Your blood pressure on this visit to the emergency department or clinic is elevated. This does not necessarily mean you have high blood pressure (hypertension), but it does mean that your blood pressure needs to be rechecked. Many times your blood pressure can increase due to illness, pain, anxiety, or other factors. We recommend that you get a series of blood pressure readings done over a period of 5 days. It is best to get a reading in the morning and one in the evening. You should make sure to sit and relax for 1-5 minutes before the reading is taken. Write the readings down and make a follow-up appointment with your health care provider to discuss the results. If there is not a free clinic or a drug store with a blood-pressure-taking machine near you, you can purchase blood-pressure-taking equipment from a drug store. Having one in the home allows you the convenience of taking your blood pressure while you are home and relaxed.  Your blood pressure in the emergency department or clinic on ________ was ____________________. BLOOD PRESSURE LOG Date: _______________________  a.m. _____________________  p.m. _____________________ Date: _______________________  a.m. _____________________  p.m. _____________________ Date: _______________________  a.m. _____________________  p.m. _____________________ Date: _______________________  a.m. _____________________  p.m. _____________________ Date:  _______________________  a.m. _____________________  p.m. _____________________ Document Released: 09/16/2002 Document Revised: 05/04/2013 Document Reviewed: 02/10/2013 Exit[employer] Patient Information 2015 [place], [place]. This information is not intended to replace advice given to you by your health care provider. Make sure you discuss any questions you have with your health care provider.

## 2013-12-09 NOTE — Assessment & Plan Note (Signed)
Patient had a supra ventricular tachycardia documented on interrogation of her ICD by Dr. Royann Shivers that was brief. She is in sinus rhythm today. She did have what appeared to be an embolic stroke. Given her cognitive limitations are not sure she's best candidate for an oral anticoagulant.

## 2013-12-09 NOTE — Assessment & Plan Note (Signed)
History of hypertension with blood pressure measured today at 140/88. She is on amlodipine 10 mg a day carvedilol 6.25 mg twice a day, lisinopril 40 mg a day and losartan 25 mg a day. She does complain of a dry hacking cough. I'm going to stop her lisinopril and increase her losartan from 2520 mg a day. She will keep a blood pressure log daily for the next month and will see Belenda Cruise back for follow-up.

## 2013-12-09 NOTE — Assessment & Plan Note (Signed)
History of hyperlipidemia on atorvastatin 40 mg a day. Her most recent lipid profile performed 02/10/13 revealed a total cholesterol 111, LDL 59 and HDL of 31

## 2014-01-06 ENCOUNTER — Telehealth: Payer: Self-pay | Admitting: Cardiovascular Disease

## 2014-01-06 NOTE — Telephone Encounter (Signed)
Returned call to patient's daughter Helmut Muster.She stated mother was better.Stated her blood sugar was over 400 yesterday, today 316.Advised she will need to get appointment with new PCP.Advised to keep appointment with Belenda Cruise this Friday 01/08/14 at 1:30 pm.

## 2014-01-06 NOTE — Telephone Encounter (Signed)
Pt's blood sugar is still up,it was 365 this morning,302 now.. She no longer have a primary doctor.Please call to advise,retaining more fluid also.

## 2014-01-08 ENCOUNTER — Ambulatory Visit (INDEPENDENT_AMBULATORY_CARE_PROVIDER_SITE_OTHER): Payer: Medicare Other | Admitting: Pharmacist Clinician (PhC)/ Clinical Pharmacy Specialist

## 2014-01-08 VITALS — BP 120/86 | HR 72 | Ht 63.0 in | Wt 195.9 lb

## 2014-01-08 DIAGNOSIS — I10 Essential (primary) hypertension: Secondary | ICD-10-CM

## 2014-01-08 NOTE — Patient Instructions (Signed)
Return for a a follow up appointment in 1 month  Your blood pressure today is 120/86  Check your blood pressure at home daily (if able) and keep record of the readings.  Take your BP meds as follows: continue losartan each morning, carvedilol twice daily.  SWITCH amlodipine 10 mg TO [facility]  Bring all of your meds, your BP cuff and your record of home blood pressures to your next appointment.  Exercise as you're able, try to walk approximately 30 minutes per day.  Keep salt intake to a minimum, especially watch canned and prepared boxed foods.  Eat more fresh fruits and vegetables and fewer canned items.  Avoid eating in fast food restaurants.    HOW TO TAKE YOUR BLOOD PRESSURE: . Rest 5 minutes before taking your blood pressure. .  Don't smoke or drink caffeinated beverages for at least 30 minutes before. . Take your blood pressure before (not after) you eat. . Sit comfortably with your back supported and both feet on the floor (don't cross your legs). . Elevate your arm to heart level on a table or a desk. . Use the proper sized cuff. It should fit smoothly and snugly around your bare upper arm. There should be enough room to slip a fingertip under the cuff. The bottom edge of the cuff should be 1 inch above the crease of the elbow. . Ideally, take 3 measurements at one sitting and record the average.

## 2014-01-08 NOTE — Progress Notes (Signed)
     01/12/2014 AYRIS AUSTERMAN 1940-12-23 [id number]   HPI:  Paula [name] is a 74 y.o. female patient of Dr Allyson Sabal, with a PMH below who presents today for hypertension clinic evaluation.  When she saw Dr. Allyson Sabal last month she was taking both lisinopril 40 mg and losartan 25 mg.  She complained of cough, so the lisinopril was discontinued and then losartan increased to 100 mg.  Cardiac Hx: nonischemic cardiomyopathy status post cath July 21, 2008, revealing an EF in the 20% range with normal coronary arteries. She has hypertension, hyperlipidemia, and non-insulin requiring diabetes  Family Hx: has multiple siblings (11), several have hypertension, however they are all 1/2 siblings on her mothers side  Social Hx: occasional cigarettes, no alcohol, coffee most days  Diet: pt does add salt to some foods, aide helps with meals  Exercise: none  Home BP readings: none  Current antihypertensive medications: amlodipine 10mg , carvedilol 6.25 bid, losartan 100mg    Current Outpatient Prescriptions  Medication Sig Dispense Refill  . alendronate (FOSAMAX) 70 MG tablet TAKE 1 TABLET BY MOUTH EVERY WEEK AS DIRECTED 4 tablet 0  . amLODipine (NORVASC) 10 MG tablet Take 10 mg by mouth daily.    Marland Kitchen atorvastatin (LIPITOR) 40 MG tablet Take 40 mg by mouth daily.    . carvedilol (COREG) 6.25 MG tablet TAKE 1 TABLET TWICE A DAY 60 tablet 11  . furosemide (LASIX) 40 MG tablet Take 1 tablet (40 mg total) by mouth 2 (two) times daily. 62 tablet 0  . insulin detemir (LEVEMIR) 100 UNIT/ML injection Inject 100 Units into the skin daily.    . insulin lispro (HUMALOG) 100 UNIT/ML injection Inject 3 Units into the skin 3 (three) times daily before meals. For cbg >=150    . isosorbide mononitrate (IMDUR) 60 MG 24 hr tablet Take 1 tablet (60 mg total) by mouth daily. 30 tablet 11  . loratadine (CLARITIN) 10 MG tablet Take 1 tablet (10 mg total) by mouth daily as needed for allergies. 30 tablet 11  . losartan  (COZAAR) 100 MG tablet Take 1 tablet (100 mg total) by mouth daily. 30 tablet 6  . nitroGLYCERIN (NITROSTAT) 0.4 MG SL tablet Place 1 tablet (0.4 mg total) under the tongue every 5 (five) minutes as needed for chest pain. 15 tablet 0   No current facility-administered medications for this visit.    No Known Allergies  Past Medical History  Diagnosis Date  . Systolic CHF     non-ischemic, last cath 09/21/08 - normal left main, LAD, LCx, ramus intermedius, RCA  . Hypertension   . Stroke   . Headache(784.0)   . Diabetes mellitus   . Dementia 05/27/2012  . Osteopenia     DEXA 2012 : T score hip -2.3, femur -2.1  . CHF (congestive heart failure)     nonischemic cardiopathy, EF 25% by 2-D echo February 2015  . Shortness of breath   . 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 Royann Shivers  . Myocardial infarction 1991    [name] 12/21/2001  . 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 Automatic implantable cardioverter-defibrillator in situ   . Hyperlipidemia     Blood pressure 120/86, pulse 72, height 5\' 3"  (1.6 m), weight 195 lb 14.4 oz (88.86 kg).    Phillips Hay PharmD CPP [facility]

## 2014-01-12 ENCOUNTER — Encounter: Payer: Self-pay | Admitting: Pharmacist Clinician (PhC)/ Clinical Pharmacy Specialist

## 2014-01-12 NOTE — Assessment & Plan Note (Signed)
Today her blood pressure is much improved at 120/86.  She is unsure of what medications she takes at what times, so I asked that she be sure to take the losartan each morning and the amlodipine each evening.  I encouraged her to work on her diet, reducing sodium and finding lower carbohydrate food choices.  Also encouraged her to exercise some, starting with walking 10-15 minutes daily.  I will see her again as needed if her BP should become elevated.

## 2014-02-06 ENCOUNTER — Emergency Department (HOSPITAL_COMMUNITY): Payer: Medicare Other

## 2014-02-06 ENCOUNTER — Encounter (HOSPITAL_COMMUNITY): Payer: Self-pay | Admitting: Emergency Medicine

## 2014-02-06 ENCOUNTER — Inpatient Hospital Stay (HOSPITAL_COMMUNITY)
Admission: EM | Admit: 2014-02-06 | Discharge: 2014-02-09 | DRG: 392 | Disposition: A | Discharge status: Skilled Nursing Facility | Payer: Medicare Other | Attending: Internal Medicine | Admitting: Internal Medicine

## 2014-02-06 DIAGNOSIS — E785 Hyperlipidemia, unspecified: Secondary | ICD-10-CM

## 2014-02-06 DIAGNOSIS — I480 Paroxysmal atrial fibrillation: Secondary | ICD-10-CM | POA: Diagnosis present

## 2014-02-06 DIAGNOSIS — I10 Essential (primary) hypertension: Secondary | ICD-10-CM | POA: Diagnosis present

## 2014-02-06 DIAGNOSIS — E876 Hypokalemia: Secondary | ICD-10-CM

## 2014-02-06 DIAGNOSIS — I639 Cerebral infarction, unspecified: Secondary | ICD-10-CM

## 2014-02-06 DIAGNOSIS — I252 Old myocardial infarction: Secondary | ICD-10-CM

## 2014-02-06 DIAGNOSIS — R627 Adult failure to thrive: Secondary | ICD-10-CM

## 2014-02-06 DIAGNOSIS — E440 Moderate protein-calorie malnutrition: Secondary | ICD-10-CM

## 2014-02-06 DIAGNOSIS — I5043 Acute on chronic combined systolic (congestive) and diastolic (congestive) heart failure: Secondary | ICD-10-CM

## 2014-02-06 DIAGNOSIS — Z794 Long term (current) use of insulin: Secondary | ICD-10-CM | POA: Diagnosis not present

## 2014-02-06 DIAGNOSIS — Z95810 Presence of automatic (implantable) cardiac defibrillator: Secondary | ICD-10-CM | POA: Diagnosis not present

## 2014-02-06 DIAGNOSIS — W19XXXA Unspecified fall, initial encounter: Secondary | ICD-10-CM | POA: Diagnosis not present

## 2014-02-06 DIAGNOSIS — M810 Age-related osteoporosis without current pathological fracture: Secondary | ICD-10-CM

## 2014-02-06 DIAGNOSIS — E860 Dehydration: Secondary | ICD-10-CM | POA: Diagnosis present

## 2014-02-06 DIAGNOSIS — Z7983 Long term (current) use of bisphosphonates: Secondary | ICD-10-CM

## 2014-02-06 DIAGNOSIS — Z8673 Personal history of transient ischemic attack (TIA), and cerebral infarction without residual deficits: Secondary | ICD-10-CM | POA: Diagnosis not present

## 2014-02-06 DIAGNOSIS — F0390 Unspecified dementia without behavioral disturbance: Secondary | ICD-10-CM | POA: Diagnosis present

## 2014-02-06 DIAGNOSIS — Z833 Family history of diabetes mellitus: Secondary | ICD-10-CM | POA: Diagnosis not present

## 2014-02-06 DIAGNOSIS — K5712 Diverticulitis of small intestine without perforation or abscess without bleeding: Secondary | ICD-10-CM | POA: Diagnosis present

## 2014-02-06 DIAGNOSIS — I2510 Atherosclerotic heart disease of native coronary artery without angina pectoris: Secondary | ICD-10-CM | POA: Diagnosis present

## 2014-02-06 DIAGNOSIS — G9340 Encephalopathy, unspecified: Secondary | ICD-10-CM

## 2014-02-06 DIAGNOSIS — M25552 Pain in left hip: Secondary | ICD-10-CM | POA: Diagnosis not present

## 2014-02-06 DIAGNOSIS — I5042 Chronic combined systolic (congestive) and diastolic (congestive) heart failure: Secondary | ICD-10-CM

## 2014-02-06 DIAGNOSIS — E875 Hyperkalemia: Secondary | ICD-10-CM

## 2014-02-06 DIAGNOSIS — K5792 Diverticulitis of intestine, part unspecified, without perforation or abscess without bleeding: Secondary | ICD-10-CM | POA: Diagnosis not present

## 2014-02-06 DIAGNOSIS — M8580 Other specified disorders of bone density and structure, unspecified site: Secondary | ICD-10-CM | POA: Diagnosis present

## 2014-02-06 DIAGNOSIS — N189 Chronic kidney disease, unspecified: Secondary | ICD-10-CM | POA: Diagnosis not present

## 2014-02-06 DIAGNOSIS — N179 Acute kidney failure, unspecified: Secondary | ICD-10-CM | POA: Diagnosis present

## 2014-02-06 DIAGNOSIS — F17210 Nicotine dependence, cigarettes, uncomplicated: Secondary | ICD-10-CM | POA: Diagnosis present

## 2014-02-06 DIAGNOSIS — Z66 Do not resuscitate: Secondary | ICD-10-CM | POA: Diagnosis present

## 2014-02-06 DIAGNOSIS — E1122 Type 2 diabetes mellitus with diabetic chronic kidney disease: Secondary | ICD-10-CM | POA: Diagnosis not present

## 2014-02-06 DIAGNOSIS — E1129 Type 2 diabetes mellitus with other diabetic kidney complication: Secondary | ICD-10-CM | POA: Diagnosis present

## 2014-02-06 DIAGNOSIS — R739 Hyperglycemia, unspecified: Secondary | ICD-10-CM

## 2014-02-06 LAB — URINE MICROSCOPIC-ADD ON

## 2014-02-06 LAB — CBC WITH DIFFERENTIAL/PLATELET
Basophils Absolute: 0 10*3/uL (ref 0.0–0.1)
Basophils Relative: 0 % (ref 0–1)
EOS PCT: 0 % (ref 0–5)
Eosinophils Absolute: 0 10*3/uL (ref 0.0–0.7)
HCT: 42.1 % (ref 36.0–46.0)
HEMOGLOBIN: 13.5 g/dL (ref 12.0–15.0)
Lymphocytes Relative: 13 % (ref 12–46)
Lymphs Abs: 1.6 10*3/uL (ref 0.7–4.0)
MCH: 28.2 pg (ref 26.0–34.0)
MCHC: 32.1 g/dL (ref 30.0–36.0)
MCV: 87.9 fL (ref 78.0–100.0)
Monocytes Absolute: 1.5 10*3/uL — ABNORMAL HIGH (ref 0.1–1.0)
Monocytes Relative: 13 % — ABNORMAL HIGH (ref 3–12)
NEUTROS ABS: 9 10*3/uL — AB (ref 1.7–7.7)
Neutrophils Relative %: 74 % (ref 43–77)
Platelets: 293 10*3/uL (ref 150–400)
RBC: 4.79 MIL/uL (ref 3.87–5.11)
RDW: 14.9 % (ref 11.5–15.5)
WBC: 12.2 10*3/uL — AB (ref 4.0–10.5)

## 2014-02-06 LAB — BASIC METABOLIC PANEL
Anion gap: 8 (ref 5–15)
BUN: 14 mg/dL (ref 6–23)
CO2: 26 mmol/L (ref 19–32)
Calcium: 9.5 mg/dL (ref 8.4–10.5)
Chloride: 101 mmol/L (ref 96–112)
Creatinine, Ser: 1.34 mg/dL — ABNORMAL HIGH (ref 0.50–1.10)
GFR calc Af Amer: 44 mL/min — ABNORMAL LOW (ref >90)
GFR, EST NON AFRICAN AMERICAN: 38 mL/min — AB (ref >90)
Glucose, Bld: 134 mg/dL — ABNORMAL HIGH (ref 70–99)
Potassium: 2.9 mmol/L — ABNORMAL LOW (ref 3.5–5.1)
Sodium: 135 mmol/L (ref 135–145)

## 2014-02-06 LAB — URINALYSIS, ROUTINE W REFLEX MICROSCOPIC
Glucose, UA: NEGATIVE mg/dL
HGB URINE DIPSTICK: NEGATIVE
Ketones, ur: NEGATIVE mg/dL
NITRITE: NEGATIVE
Protein, ur: 100 mg/dL — AB
SPECIFIC GRAVITY, URINE: 1.016 (ref 1.005–1.030)
UROBILINOGEN UA: 1 mg/dL (ref 0.0–1.0)
pH: 5 (ref 5.0–8.0)

## 2014-02-06 LAB — GLUCOSE, CAPILLARY
GLUCOSE-CAPILLARY: 146 mg/dL — AB (ref 70–99)
GLUCOSE-CAPILLARY: 193 mg/dL — AB (ref 70–99)
Glucose-Capillary: 108 mg/dL — ABNORMAL HIGH (ref 70–99)

## 2014-02-06 LAB — CK: CK TOTAL: 134 U/L (ref 7–177)

## 2014-02-06 MED ORDER — HYDROCODONE-ACETAMINOPHEN 5-325 MG PO TABS
1.0000 | ORAL_TABLET | ORAL | Status: DC | PRN
  Administered 2014-02-06 – 2014-02-07 (×2): 1 via ORAL
  Filled 2014-02-06 (×3): qty 1

## 2014-02-06 MED ORDER — POLYETHYLENE GLYCOL 3350 17 G PO PACK: 17.0000 g | PACK | Freq: Every day | ORAL | Status: DC | PRN

## 2014-02-06 MED ORDER — INSULIN ASPART 100 UNIT/ML ~~LOC~~ SOLN
0.0000 [IU] | Freq: Every day | SUBCUTANEOUS | Status: DC
  Administered 2014-02-07: 0 [IU] via SUBCUTANEOUS

## 2014-02-06 MED ORDER — POTASSIUM CHLORIDE CRYS ER 20 MEQ PO TBCR
40.0000 meq | EXTENDED_RELEASE_TABLET | Freq: Once | ORAL | Status: AC
  Administered 2014-02-06: 40 meq via ORAL
  Filled 2014-02-06 (×2): qty 2

## 2014-02-06 MED ORDER — CARVEDILOL 3.125 MG PO TABS
3.1250 mg | ORAL_TABLET | Freq: Two times a day (BID) | ORAL | Status: DC
  Administered 2014-02-06 – 2014-02-09 (×6): 3.125 mg via ORAL
  Filled 2014-02-06 (×8): qty 1

## 2014-02-06 MED ORDER — NITROGLYCERIN 0.4 MG SL SUBL
0.4000 mg | SUBLINGUAL_TABLET | SUBLINGUAL | Status: DC | PRN
Start: 2014-02-06 — End: 2014-02-09

## 2014-02-06 MED ORDER — ASPIRIN 81 MG PO CHEW
81.0000 mg | CHEWABLE_TABLET | Freq: Every day | ORAL | Status: DC
  Administered 2014-02-07 – 2014-02-09 (×3): 81 mg via ORAL
  Filled 2014-02-06 (×4): qty 1

## 2014-02-06 MED ORDER — INSULIN DETEMIR 100 UNIT/ML ~~LOC~~ SOLN
40.0000 [IU] | Freq: Once | SUBCUTANEOUS | Status: AC
  Administered 2014-02-06: 40 [IU] via SUBCUTANEOUS
  Filled 2014-02-06: qty 0.4

## 2014-02-06 MED ORDER — CIPROFLOXACIN IN D5W 400 MG/200ML IV SOLN
400.0000 mg | Freq: Once | INTRAVENOUS | Status: AC
  Administered 2014-02-06: 400 mg via INTRAVENOUS
  Filled 2014-02-06: qty 200

## 2014-02-06 MED ORDER — PIPERACILLIN-TAZOBACTAM 3.375 G IVPB
3.3750 g | Freq: Three times a day (TID) | INTRAVENOUS | Status: DC
  Administered 2014-02-06 – 2014-02-07 (×3): 3.375 g via INTRAVENOUS
  Filled 2014-02-06 (×4): qty 50

## 2014-02-06 MED ORDER — METRONIDAZOLE IN NACL 5-0.79 MG/ML-% IV SOLN
500.0000 mg | Freq: Once | INTRAVENOUS | Status: AC
  Administered 2014-02-06: 500 mg via INTRAVENOUS
  Filled 2014-02-06: qty 100

## 2014-02-06 MED ORDER — TRAMADOL HCL 50 MG PO TABS
50.0000 mg | ORAL_TABLET | Freq: Once | ORAL | Status: AC
  Administered 2014-02-06: 50 mg via ORAL
  Filled 2014-02-06: qty 1

## 2014-02-06 MED ORDER — ATORVASTATIN CALCIUM 40 MG PO TABS
40.0000 mg | ORAL_TABLET | Freq: Every day | ORAL | Status: DC
  Administered 2014-02-06 – 2014-02-08 (×3): 40 mg via ORAL
  Filled 2014-02-06 (×5): qty 1

## 2014-02-06 MED ORDER — HYDROMORPHONE HCL 1 MG/ML IJ SOLN: 0.2500 mg | Freq: Once | INTRAMUSCULAR | Status: DC

## 2014-02-06 MED ORDER — ALUM & MAG HYDROXIDE-SIMETH 200-200-20 MG/5ML PO SUSP: 30.0000 mL | Freq: Four times a day (QID) | ORAL | Status: DC | PRN

## 2014-02-06 MED ORDER — POTASSIUM CHLORIDE CRYS ER 20 MEQ PO TBCR
40.0000 meq | EXTENDED_RELEASE_TABLET | Freq: Once | ORAL | Status: AC
  Administered 2014-02-06: 40 meq via ORAL
  Filled 2014-02-06: qty 2

## 2014-02-06 MED ORDER — INSULIN DETEMIR 100 UNIT/ML ~~LOC~~ SOLN
70.0000 [IU] | Freq: Every day | SUBCUTANEOUS | Status: DC
  Filled 2014-02-06 (×2): qty 0.7

## 2014-02-06 MED ORDER — ONDANSETRON HCL 4 MG/2ML IJ SOLN: 4.0000 mg | Freq: Four times a day (QID) | INTRAMUSCULAR | Status: DC | PRN

## 2014-02-06 MED ORDER — INSULIN ASPART 100 UNIT/ML ~~LOC~~ SOLN
0.0000 [IU] | Freq: Three times a day (TID) | SUBCUTANEOUS | Status: DC
  Administered 2014-02-06: 1 [IU] via SUBCUTANEOUS
  Administered 2014-02-07: 3 [IU] via SUBCUTANEOUS
  Administered 2014-02-07: 5 [IU] via SUBCUTANEOUS
  Administered 2014-02-07 – 2014-02-08 (×2): 2 [IU] via SUBCUTANEOUS

## 2014-02-06 MED ORDER — ISOSORBIDE MONONITRATE ER 60 MG PO TB24
60.0000 mg | ORAL_TABLET | Freq: Every day | ORAL | Status: DC
  Administered 2014-02-06 – 2014-02-09 (×4): 60 mg via ORAL
  Filled 2014-02-06 (×6): qty 1

## 2014-02-06 MED ORDER — GUAIFENESIN-DM 100-10 MG/5ML PO SYRP
5.0000 mL | ORAL_SOLUTION | ORAL | Status: DC | PRN
  Filled 2014-02-06: qty 5

## 2014-02-06 MED ORDER — ONDANSETRON HCL 4 MG PO TABS: 4.0000 mg | ORAL_TABLET | Freq: Four times a day (QID) | ORAL | Status: DC | PRN

## 2014-02-06 MED ORDER — POTASSIUM CHLORIDE IN NACL 40-0.9 MEQ/L-% IV SOLN
INTRAVENOUS | Status: DC
  Administered 2014-02-06: 100 mL/h via INTRAVENOUS
  Filled 2014-02-06 (×3): qty 1000

## 2014-02-06 MED ORDER — HYDROMORPHONE HCL 1 MG/ML IJ SOLN
0.5000 mg | Freq: Once | INTRAMUSCULAR | Status: AC
  Administered 2014-02-06: 0.5 mg via INTRAMUSCULAR
  Filled 2014-02-06: qty 1

## 2014-02-06 MED ORDER — HEPARIN SODIUM (PORCINE) 5000 UNIT/ML IJ SOLN
5000.0000 [IU] | Freq: Three times a day (TID) | INTRAMUSCULAR | Status: DC
  Administered 2014-02-06 – 2014-02-09 (×9): 5000 [IU] via SUBCUTANEOUS
  Filled 2014-02-06 (×11): qty 1

## 2014-02-06 MED ORDER — AMLODIPINE BESYLATE 10 MG PO TABS
10.0000 mg | ORAL_TABLET | Freq: Every day | ORAL | Status: DC
  Administered 2014-02-06 – 2014-02-09 (×4): 10 mg via ORAL
  Filled 2014-02-06 (×5): qty 1

## 2014-02-06 NOTE — H&P (Addendum)
Patient Demographics  Paula [name], is a 74 y.o. female  MRN: [id number]   DOB - 16-Jan-1940  Admit Date - 02/06/2014  Outpatient Primary MD for the patient is [name], MD   With History of -  Past Medical History  Diagnosis Date  . Systolic CHF     non-ischemic, last cath 09/21/08 - normal left main, LAD, LCx, ramus intermedius, RCA  . Hypertension   . Stroke   . Headache(784.0)   . Diabetes mellitus   . Dementia 05/27/2012  . Osteopenia     DEXA 2012 : T score hip -2.3, femur -2.1  . CHF (congestive heart failure)     nonischemic cardiopathy, EF 25% by 2-D echo February 2015  . Shortness of breath   . 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 Royann Shivers  . Myocardial infarction 1991    [name] 12/21/2001  . 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 Automatic implantable cardioverter-defibrillator in situ   . Hyperlipidemia       Past Surgical History  Procedure Laterality Date  . Back surgery    . Abdominal hysterectomy    . Cardiac catheterization      [name] 12/22/2001  . Cardiac defibrillator placement  12/15/2010    dual-chamber - Medtronic Proteca (Dr. Judie Petit. Croitoru)  . Transthoracic echocardiogram  05/2010    EF 20-25%, mod conc hypertrophy, grade 3 diastolic dysfunction, elevated LVEDP; dilated MV annulus with mod regurg; LA severely dilated; RV mildly dilated; RA mod-severely dilated; mild-mod TR; PA peak pressure ; mod pulm hypertension  . Transthoracic echocardiogram  02/10/2013    EF 25%, mild AV regurg; LA mod dialted; RA mildly dilated; mod TR; PA peak  . Nm myocar perf wall motion  08/2008    lexiscan - diaphragmatic attenuation of inferior wall, EF 37%  . Cardiac  catheterization  12/21/2001    normal LAD, Cfx w/prox luminal irregularities, RCA dominant with diffuse luminal irregularities (Dr. Daiva Nakayama)  . Cardiac catheterization  09/21/2008    normal left main/LAD/Cfx/RCA, ramus intermedius is large and normal (Dr. Erlene Quan)  . Implantable cardioverter defibrillator implant N/A 12/15/2010    Procedure: IMPLANTABLE CARDIOVERTER DEFIBRILLATOR IMPLANT;  Surgeon: Thurmon Fair, MD;  Location: [facility] CATH [facility];  Service: Cardiovascular;  Laterality: N/A;    in for   Chief Complaint  Patient presents with  . Fall     HPI  Paula [name]  is a 74 y.o. female, with history of type 2 diabetes mellitus on insulin, chronic combined diastolic and systolic heart failure status post defibrillator placement, EF around 25%. Paroxysmal atrial fibrillation, ischemic or dermopathy, Early dementia, essential hypertension, dyslipidemia who lives at her home and is supposed to use a walker to ambulate but was not using it last night. While she was getting out of the bed she had a mechanical fall and  fell on her left side. She was on the floor for several hours before she called health. Subsequently she was brought to the ER because she was complaining of left-sided pain post fall.  In the ER workup consistent of diverticulitis noted on CT scan, CT scan of the left hip and x-ray of the left hip nonacute, she has evidence of dehydration with acute renal failure, hypokalemia, I was called to admit the patient was same. He does have mild right-sided abdominal pain. Denies any diarrhea.    Review of Systems    In addition to the HPI above,   No Fever-chills, No Headache, No changes with Vision or hearing, No problems swallowing food or Liquids, No Chest pain, Cough or Shortness of Breath, No Abdominal pain, No Nausea or Vommitting, Bowel movements are regular, No Blood in stool or Urine, No dysuria, No new skin rashes or bruises, No new joints pains-aches, except  left-sided body aches mostly in the left hip. No new weakness, tingling, numbness in any extremity, No recent weight gain or loss, No polyuria, polydypsia or polyphagia, No significant Mental Stressors.  A full 10 point Review of Systems was done, except as stated above, all other Review of Systems were negative.   Social History History  Substance Use Topics  . Smoking status: Current Every Day Smoker    Types: Cigarettes  . Smokeless tobacco: Never Used  . Alcohol Use: No      Family History Grand Mother had diabetes mellitus  Prior to Admission medications   Medication Sig Start Date End Date Taking? Authorizing Provider  alendronate (FOSAMAX) 70 MG tablet TAKE 1 TABLET BY MOUTH EVERY WEEK AS DIRECTED 05/12/13   Oneal Grout, MD  amLODipine (NORVASC) 10 MG tablet Take 10 mg by mouth daily.    Historical Provider, MD  atorvastatin (LIPITOR) 40 MG tablet Take 40 mg by mouth daily.    Historical Provider, MD  carvedilol (COREG) 6.25 MG tablet TAKE 1 TABLET TWICE A DAY 07/01/13   Runell Gess, MD  furosemide (LASIX) 40 MG tablet Take 1 tablet (40 mg total) by mouth 2 (two) times daily. 02/14/13   Rodolph Bong, MD  insulin detemir (LEVEMIR) 100 UNIT/ML injection Inject 100 Units into the skin daily.    Historical Provider, MD  insulin lispro (HUMALOG) 100 UNIT/ML injection Inject 3 Units into the skin 3 (three) times daily before meals. For cbg >=150    Historical Provider, MD  isosorbide mononitrate (IMDUR) 60 MG 24 hr tablet Take 1 tablet (60 mg total) by mouth daily. 07/01/13   Runell Gess, MD  loratadine (CLARITIN) 10 MG tablet Take 1 tablet (10 mg total) by mouth daily as needed for allergies. 04/09/13   Sharee Holster, NP  losartan (COZAAR) 100 MG tablet Take 1 tablet (100 mg total) by mouth daily. 12/09/13   Runell Gess, MD  nitroGLYCERIN (NITROSTAT) 0.4 MG SL tablet Place 1 tablet (0.4 mg total) under the tongue every 5 (five) minutes as needed for chest pain.  02/14/13   Rodolph Bong, MD    No Known Allergies  Physical Exam  Vitals  Blood pressure 131/71, pulse 86, temperature 99.3 F (37.4 C), temperature source Oral, resp. rate 18, SpO2 96 %.   1. General elderly African-American female lying in bed in NAD,     2. Normal affect and insight, Not Suicidal or Homicidal, Awake Alert, Oriented X 2.  3. No F.N deficits, ALL C.Nerves Intact, Strength 5/5 all 4  extremities, Sensation intact all 4 extremities, Plantars down going.  4. Ears and Eyes appear Normal, Conjunctivae clear, PERRLA. Moist Oral Mucosa.  5. Supple Neck, No JVD, No cervical lymphadenopathy appriciated, No Carotid Bruits.  6. Symmetrical Chest wall movement, Good air movement bilaterally, CTAB.  7. RRR, No Gallops, Rubs or Murmurs, No Parasternal Heave.  8. Positive Bowel Sounds, Abdomen Soft, mild right lower quadrant tenderness, No organomegaly appriciated,No rebound -guarding or rigidity.  9.  No Cyanosis, Normal Skin Turgor, No Skin Rash or Bruise.  10. Good muscle tone,  joints appear normal , no effusions, Normal ROM. Some tenderness over the left hip, however able to lift both legs, some pain on movement in the left hip.  11. No Palpable Lymph Nodes in Neck or Axillae     Data Review  CBC  Recent Labs Lab 02/06/14 0456  WBC 12.2*  HGB 13.5  HCT 42.1  PLT 293  MCV 87.9  MCH 28.2  MCHC 32.1  RDW 14.9  LYMPHSABS 1.6  MONOABS 1.5*  EOSABS 0.0  BASOSABS 0.0   ------------------------------------------------------------------------------------------------------------------  Chemistries   Recent Labs Lab 02/06/14 0456  NA 135  K 2.9*  CL 101  CO2 26  GLUCOSE 134*  BUN 14  CREATININE 1.34*  CALCIUM 9.5   ------------------------------------------------------------------------------------------------------------------ CrCl cannot be calculated (Unknown ideal  weight.). ------------------------------------------------------------------------------------------------------------------ No results for input(s): TSH, T4TOTAL, T3FREE, THYROIDAB in the last 72 hours.  Invalid input(s): FREET3   Coagulation profile No results for input(s): INR, PROTIME in the last 168 hours. ------------------------------------------------------------------------------------------------------------------- No results for input(s): DDIMER in the last 72 hours. -------------------------------------------------------------------------------------------------------------------  Cardiac Enzymes No results for input(s): CKMB, TROPONINI, MYOGLOBIN in the last 168 hours.  Invalid input(s): CK ------------------------------------------------------------------------------------------------------------------ Invalid input(s): POCBNP   ---------------------------------------------------------------------------------------------------------------  Urinalysis    Component Value Date/Time   COLORURINE YELLOW 02/06/2014 0516   APPEARANCEUR CLOUDY* 02/06/2014 0516   LABSPEC 1.016 02/06/2014 0516   PHURINE 5.0 02/06/2014 0516   GLUCOSEU NEGATIVE 02/06/2014 0516   HGBUR NEGATIVE 02/06/2014 0516   BILIRUBINUR SMALL* 02/06/2014 0516   KETONESUR NEGATIVE 02/06/2014 0516   PROTEINUR 100* 02/06/2014 0516   UROBILINOGEN 1.0 02/06/2014 0516   NITRITE NEGATIVE 02/06/2014 0516   LEUKOCYTESUR SMALL* 02/06/2014 0516    ----------------------------------------------------------------------------------------------------------------  Imaging results:   Dg Lumbar Spine Complete  02/06/2014   CLINICAL DATA:  Lumbar spine pain after fall.  Fall 8 hr prior.  EXAM: LUMBAR SPINE - COMPLETE 4+ VIEW  COMPARISON:  None.  FINDINGS: The alignment is maintained. Vertebral body heights are normal. There is no listhesis. The posterior elements are intact. There is disc space narrowing at L5-S1. Facet  arthropathy at L4-L5 and L5-S1. No fracture. Sacroiliac joints are congruent. There is small calcifications adjacent to the right L1 transverse process.  IMPRESSION: 1. No fracture or subluxation. Degenerative change at the lower lumbar spine. 2. Soft tissue calcifications in the right upper abdomen, may be gallstones, renal, or vascular.   Electronically Signed   By: Rubye Oaks M.D.   On: 02/06/2014 06:43   Ct Pelvis Wo Contrast  02/06/2014   CLINICAL DATA:  Fall 3 days ago with left hip pain and unable to bear weight.  EXAM: CT PELVIS WITHOUT CONTRAST  TECHNIQUE: Multidetector CT imaging of the pelvis was performed following the standard protocol without intravenous contrast.  COMPARISON:  None.  FINDINGS: Examination demonstrates severe diverticulosis of the colon. There is also diverticulosis of the terminal ileum. The appendix is not definitely visualized. There is an inflammatory process  within the right lower quadrant/ upper pelvis centered around an ileal diverticulum compatible with acute diverticulitis of the ileum. There is no perforation or abscess.  There is calcified plaque over the abdominal aorta and iliac arteries. The bladder and rectum are within normal. 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  There are degenerative changes of the spine, sacroiliac joints, symphysis pubis joint and hips. There is no evidence of acute fracture or dislocation.  IMPRESSION: No acute fracture or dislocation. If patient continues to have persistent left hip pain with inability to ambulate, consider MRI for more sensitive exam to exclude occult fracture.  Acute diverticulitis of the ileum within the right lower quadrant/ upper pelvis. No perforation or abscess. Appendix not visualized.  Severe diverticulosis of the colon.  These results were called by telephone at the time of interpretation on 02/06/2014 at 10:53 am to Dr. Toy Cookey , who verbally acknowledged these results.   Electronically Signed   By:  Elberta Fortis M.D.   On: 02/06/2014 10:53   Dg Hip Unilat With Pelvis 2-3 Views Left  02/06/2014   CLINICAL DATA:  Left hip pain after fall.  Fall 8 hr prior.  EXAM: LEFT HIP (WITH PELVIS) 2-3 VIEWS  COMPARISON:  None.  FINDINGS: The cortical margins of the bony pelvis are intact. No fracture. Pubic symphysis and sacroiliac joints are congruent. Both femoral heads are well-seated in the respective acetabula. There is acetabular spurring of left greater than right acetabulum. Mild degenerative change of the pubic symphysis.  IMPRESSION: Mild degenerative change, no fracture or subluxation.   Electronically Signed   By: Rubye Oaks M.D.   On: 02/06/2014 06:41    My personal review of EKG: Rhythm NSR, LBBB old    Assessment & Plan  1. Acute diverticulitis. Will be admitted to MedSurg bed, liquid diet, IV fluids, IV Zosyn. Monitor clinically.   2. Mechanical fall with left-sided body ache and left hip pain. Likely due to lying over left side for several hours, CT left hip is stable, will give supportive care, IV fluids, PT eval. If left hip pain persists repeat CT scan in the morning. Cannot do MRI due to defibrillator.   3. Chronic combined systolic and diastolic CHF EF and 25%. Currently mildly dehydrated, hold ACE/ARB, hold diuretic, gently hydrate. Monitor. Continue beta blocker.   4. CAD-paroxysmal atrial fibrillation. No acute issue, chest pain-free, not a candidate for anticoagulation due to transition falls,CHAD Vasc 2 score is >5, continue beta blocker, will add low-dose aspirin.   5. ARF. Dehydrated. IV fluids, hold ACE/ARB, repeat BMP in the morning.    6. Dyslipidemia. Continue statin.    7. Essential hypertension we will continue Norvasc, Imdur and beta blocker. Hold ARB due to acute renal failure.    8. DM2 - A1c, reduce Levemir as on Liquids, ISS.     DVT Prophylaxis Heparin    AM Labs Ordered, also please review Full Orders  Family Communication:  Admission, patients condition and plan of care including tests being ordered have been discussed with the patient   who indicates understanding and agree with the plan and Code Status.  Code Status DNR  Likely DC to  TBD  Condition Fair  Time spent in minutes : 35    Carsyn Boster K M.D on 02/06/2014 at 12:11 PM  Between 7am to 7pm - Pager - [phone]  After 7pm go to [email or web address] - password [email or web address]  [facility] Office  [phone]

## 2014-02-06 NOTE — ED Notes (Signed)
Assisted to use bedpan.  Tolerated well.  Urine sent to lab per order.  Encouraged to call for assistance as needed.

## 2014-02-06 NOTE — ED Notes (Signed)
Pt from home, arrives with c/o letheragy over the last three days and fall this evening. L hip pain, states she fell on L hip this evening. Fall occurred 8 hours ago, likely mechanical.

## 2014-02-06 NOTE — ED Provider Notes (Signed)
Patient continued to have left hip pain and was unable to ambulate.  CT pelvis added to rule out occult fracture.  Did not show skeletal findings, although did show a right-sided acute diverticulitis.  On repeat abdominal exam she is tender in her right abdomen continued to be very tender in her left hip.  Given she is unable to ambulate, has acute intra-abdominal infection, will speak with [facility] regarding admission.  Cipro and Flagyl ordered.  1. Hypokalemia   2. Fall   3. Diverticulitis of intestine without perforation or abscess without bleeding      Toy Cookey, MD 02/06/14 1148

## 2014-02-06 NOTE — ED Provider Notes (Signed)
CSN: [id number]     Arrival date & time 02/06/14  0420 History   First MD Initiated Contact with Patient 02/06/14 0430     Chief Complaint  Patient presents with  . Fall     (Consider location/radiation/quality/duration/timing/severity/associated sxs/prior Treatment) HPI 74 year old female presents to emergency department from home after a fall via EMS.  Patient is a poor historian.  Patient per EMS is had lethargy and weakness for the last 3-4 days.  She initially corroborates this, but then changes to say she has had weakness only in her left hip.  Patient with fall tonight when she was trying to climb into bed, landing on her left hip.  She reports she was unable to get up after the fall, and laid on her left side for several hours.  Patient reports that she lives with her daughter.  She reports history of diabetes, hypertension, stroke and heart attack.  She denies previous injury to her left hip.  She reports that she is eating and drinking well.  She denies striking her head or LOC.  She denies any headache, chest pain, shortness of breath, abdominal pain, nausea, vomiting or diarrhea. Past Medical History  Diagnosis Date  . Systolic CHF     non-ischemic, last cath 09/21/08 - normal left main, LAD, LCx, ramus intermedius, RCA  . Hypertension   . Stroke   . Headache(784.0)   . Diabetes mellitus   . Dementia 05/27/2012  . Osteopenia     DEXA 2012 : T score hip -2.3, femur -2.1  . CHF (congestive heart failure)     nonischemic cardiopathy, EF 25% by 2-D echo February 2015  . Shortness of breath   . 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 Royann Shivers  . Myocardial infarction 1991    [name] 12/21/2001  . 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 Automatic implantable cardioverter-defibrillator in situ   . Hyperlipidemia    Past Surgical History  Procedure Laterality Date  . Back surgery    .  Abdominal hysterectomy    . Cardiac catheterization      [name] 12/22/2001  . Cardiac defibrillator placement  12/15/2010    dual-chamber - Medtronic Proteca (Dr. Judie Petit. [place])  . Transthoracic echocardiogram  05/2010    EF 20-25%, mod conc hypertrophy, grade 3 diastolic dysfunction, elevated LVEDP; dilated MV annulus with mod regurg; LA severely dilated; RV mildly dilated; RA mod-severely dilated; mild-mod TR; PA peak pressure ; mod pulm hypertension  . Transthoracic echocardiogram  02/10/2013    EF 25%, mild AV regurg; LA mod dialted; RA mildly dilated; mod TR; PA peak  . Nm myocar perf wall motion  08/2008    lexiscan - diaphragmatic attenuation of inferior wall, EF 37%  . Cardiac catheterization  12/21/2001    normal LAD, Cfx w/prox luminal irregularities, RCA dominant with diffuse luminal irregularities (Dr. Daiva Nakayama)  . Cardiac catheterization  09/21/2008    normal left main/LAD/Cfx/RCA, ramus intermedius is large and normal (Dr. Erlene Quan)  . Implantable cardioverter defibrillator implant N/A 12/15/2010    Procedure: IMPLANTABLE CARDIOVERTER DEFIBRILLATOR IMPLANT;  Surgeon: Thurmon Fair, MD;  Location: [facility] CATH [facility];  Service: Cardiovascular;  Laterality: N/A;   No family history on file. History  Substance Use Topics  . Smoking status: Current Every Day Smoker    Types: Cigarettes  . Smokeless tobacco: Never Used  . Alcohol Use: No   OB History  No data available     Review of Systems  Unable to perform ROS: Dementia      Allergies  Review of patient's allergies indicates no known allergies.  Home Medications   Prior to Admission medications   Medication Sig Start Date End Date Taking? Authorizing Provider  alendronate (FOSAMAX) 70 MG tablet TAKE 1 TABLET BY MOUTH EVERY WEEK AS DIRECTED 05/12/13   Oneal Grout, MD  amLODipine (NORVASC) 10 MG tablet Take 10 mg by mouth daily.    Historical Provider, MD  atorvastatin (LIPITOR) 40 MG tablet Take 40 mg by  mouth daily.    Historical Provider, MD  carvedilol (COREG) 6.25 MG tablet TAKE 1 TABLET TWICE A DAY 07/01/13   Runell Gess, MD  furosemide (LASIX) 40 MG tablet Take 1 tablet (40 mg total) by mouth 2 (two) times daily. 02/14/13   Rodolph Bong, MD  insulin detemir (LEVEMIR) 100 UNIT/ML injection Inject 100 Units into the skin daily.    Historical Provider, MD  insulin lispro (HUMALOG) 100 UNIT/ML injection Inject 3 Units into the skin 3 (three) times daily before meals. For cbg >=150    Historical Provider, MD  isosorbide mononitrate (IMDUR) 60 MG 24 hr tablet Take 1 tablet (60 mg total) by mouth daily. 07/01/13   Runell Gess, MD  loratadine (CLARITIN) 10 MG tablet Take 1 tablet (10 mg total) by mouth daily as needed for allergies. 04/09/13   Sharee Holster, NP  losartan (COZAAR) 100 MG tablet Take 1 tablet (100 mg total) by mouth daily. 12/09/13   Runell Gess, MD  nitroGLYCERIN (NITROSTAT) 0.4 MG SL tablet Place 1 tablet (0.4 mg total) under the tongue every 5 (five) minutes as needed for chest pain. 02/14/13   Rodolph Bong, MD   BP 116/84 mmHg  Pulse 86  Temp(Src) 99.3 F (37.4 C) (Oral)  Resp 21  SpO2 97% Physical Exam  Constitutional: She is oriented to person, place, and time. She appears well-developed and well-nourished. No distress.  HENT:  Head: Normocephalic and atraumatic.  Right Ear: External ear normal.  Left Ear: External ear normal.  Nose: Nose normal.  Mouth/Throat: Oropharynx is clear and moist.  Eyes: Conjunctivae and EOM are normal. Pupils are equal, round, and reactive to light.  Neck: Normal range of motion. Neck supple. No JVD present. No tracheal deviation present. No thyromegaly present.  Cardiovascular: Normal rate, regular rhythm, normal heart sounds and intact distal pulses.  Exam reveals no gallop and no friction rub.   No murmur heard. Pulmonary/Chest: Effort normal and breath sounds normal. No stridor. No respiratory distress. She has no  wheezes. She has no rales. She exhibits no tenderness.  Abdominal: Soft. Bowel sounds are normal. She exhibits no distension and no mass. There is no tenderness. There is no rebound and no guarding.  Musculoskeletal: Normal range of motion. She exhibits tenderness (mild tenderness with range of motion of the left hip). She exhibits no edema.  Lymphadenopathy:    She has no cervical adenopathy.  Neurological: She is alert and oriented to person, place, and time. She displays normal reflexes. No cranial nerve deficit. She exhibits normal muscle tone. Coordination normal.  Skin: Skin is warm and dry. No rash noted. No erythema. No pallor.  Psychiatric: She has a normal mood and affect. Her behavior is normal. Judgment and thought content normal.  Nursing note and vitals reviewed.   ED Course  Procedures (including critical care time) Labs Review Labs Reviewed  URINE  CULTURE  URINALYSIS, ROUTINE W REFLEX MICROSCOPIC  BASIC METABOLIC PANEL  CBC WITH DIFFERENTIAL/PLATELET  CK    Imaging Review No results found.   EKG Interpretation   Date/Time:  Saturday February 06 2014 04:33:08 EST Ventricular Rate:  87 PR Interval:  211 QRS Duration: 153 QT Interval:  451 QTC Calculation: 543 R Axis:   -49 Text Interpretation:  Sinus rhythm Left bundle branch block No significant  change since last tracing Confirmed by Enya Bureau  MD, Warren Lindahl ([id number]) on 02/06/2014  4:45:41 AM      MDM   Final diagnoses:  Fall    74 year old female with fall at home, with left hip pain.  No shortening or rotation and only minimal pain with movement of hip.  Plan for x-rays, labs, urine complaint of weakness/lethargy.  No fevers or chills.  No GI complaints.  7:56 AM Pt reports she does not want to try to walk due to pain, but reports pain is improved.  Pain improved with ultram.  To try walking in next 30 min.  Dr Micheline Maze to follow.  Olivia Mackie, MD 02/06/14 [phone]

## 2014-02-06 NOTE — ED Notes (Signed)
Pt refused to walk, states "I hurt last time I got up." MD and RN aware

## 2014-02-06 NOTE — Progress Notes (Signed)
ANTIBIOTIC CONSULT NOTE - INITIAL  Pharmacy Consult for zosyn Indication: intraabdominal infection  No Known Allergies  Patient Measurements: Height:  (165.1 cm) Weight: 188 lb 6.4 oz (85.458 kg) IBW/kg (Calculated) : 57   Vital Signs: Temp: 98.2 F (36.8 C) (02/06 1431) Temp Source: Oral (02/06 1431) BP: 145/67 mmHg (02/06 1431) Pulse Rate: 92 (02/06 1431) Intake/Output from previous day:   Intake/Output from this shift: Total I/O In: -  Out: 400 [Urine:400]  Labs:  Recent Labs  02/06/14 0456  WBC 12.2*  HGB 13.5  PLT 293  CREATININE 1.34*   Estimated Creatinine Clearance: 39.8 mL/min (by C-G formula based on Cr of 1.34). No results for input(s): VANCOTROUGH, VANCOPEAK, VANCORANDOM, GENTTROUGH, GENTPEAK, GENTRANDOM, TOBRATROUGH, TOBRAPEAK, TOBRARND, AMIKACINPEAK, AMIKACINTROU, AMIKACIN in the last 72 hours.   Microbiology: No results found for this or any previous visit (from the past 720 hour(s)).  Medical History: Past Medical History  Diagnosis Date  . Systolic CHF     non-ischemic, last cath 09/21/08 - normal left main, LAD, LCx, ramus intermedius, RCA  . Hypertension   . Stroke   . Headache(784.0)   . Diabetes mellitus   . Dementia 05/27/2012  . Osteopenia     DEXA 2012 : T score hip -2.3, femur -2.1  . CHF (congestive heart failure)     nonischemic cardiopathy, EF 25% by 2-D echo February 2015  . Shortness of breath   . 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 Royann Shivers  . Myocardial infarction 1991    [name] 12/21/2001  . 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 Automatic implantable cardioverter-defibrillator in situ   . Hyperlipidemia     Medications:  Prescriptions prior to admission  Medication Sig Dispense Refill Last Dose  . alendronate (FOSAMAX) 70 MG tablet TAKE 1 TABLET BY MOUTH EVERY WEEK AS DIRECTED 4 tablet 0 02/03/2014  . ALPRAZolam  (XANAX) 0.25 MG tablet Take 0.25 mg by mouth at bedtime.   02/05/2014 at Unknown time  . amLODipine (NORVASC) 10 MG tablet Take 10 mg by mouth at bedtime.    02/05/2014 at Unknown time  . carvedilol (COREG) 6.25 MG tablet TAKE 1 TABLET TWICE A DAY (Patient taking differently: Take 6.25 mg by mouth 2 (two) times daily with a meal. ) 60 tablet 11 02/05/2014 at 2100  . furosemide (LASIX) 40 MG tablet Take 1 tablet (40 mg total) by mouth 2 (two) times daily. 62 tablet 0 02/05/2014 at Unknown time  . Insulin Detemir (LEVEMIR) 100 UNIT/ML Pen Inject 15 Units into the skin daily. Flextouch   02/05/2014 at Unknown time  . isosorbide mononitrate (IMDUR) 60 MG 24 hr tablet Take 1 tablet (60 mg total) by mouth daily. 30 tablet 11 02/05/2014 at Unknown time  . Liraglutide 18 MG/3ML SOPN Inject 1.2 mg into the skin daily. Victoza   02/05/2014 at Unknown time  . loratadine (CLARITIN) 10 MG tablet Take 1 tablet (10 mg total) by mouth daily as needed for allergies. 30 tablet 11 unknown  . losartan (COZAAR) 100 MG tablet Take 1 tablet (100 mg total) by mouth daily. 30 tablet 6 02/05/2014 at Unknown time  . nitroGLYCERIN (NITROSTAT) 0.4 MG SL tablet Place 1 tablet (0.4 mg total) under the tongue every 5 (five) minutes as needed for chest pain. 15 tablet 0 unknown  . traMADol (ULTRAM) 50 MG tablet Take 50 mg by mouth every 6 (six) hours as needed (  pain).   02/05/2014 at Unknown time   Assessment: 74 yo lady to start zosyn for intraabdominal infection.  Her CrCl ~40 ml.min  Goal of Therapy:  Eradication of infection  Plan:  Zosyn 3.375 gm IV q8 hours IE F/u renal function, cultures and clinical course  Thanks for allowing pharmacy to be a part of this patient's care.  Talbert Cage, PharmD Clinical Pharmacist, [phone]  02/06/2014,3:55 PM

## 2014-02-06 NOTE — ED Notes (Signed)
Taken to xray at this time by radiology department.

## 2014-02-07 DIAGNOSIS — E1129 Type 2 diabetes mellitus with other diabetic kidney complication: Secondary | ICD-10-CM | POA: Diagnosis present

## 2014-02-07 LAB — CBC
HCT: 36.2 % (ref 36.0–46.0)
HEMOGLOBIN: 11.8 g/dL — AB (ref 12.0–15.0)
MCH: 28.2 pg (ref 26.0–34.0)
MCHC: 32.6 g/dL (ref 30.0–36.0)
MCV: 86.4 fL (ref 78.0–100.0)
Platelets: 277 10*3/uL (ref 150–400)
RBC: 4.19 MIL/uL (ref 3.87–5.11)
RDW: 15 % (ref 11.5–15.5)
WBC: 14.6 10*3/uL — ABNORMAL HIGH (ref 4.0–10.5)

## 2014-02-07 LAB — GLUCOSE, CAPILLARY
GLUCOSE-CAPILLARY: 99 mg/dL (ref 70–99)
Glucose-Capillary: 182 mg/dL — ABNORMAL HIGH (ref 70–99)
Glucose-Capillary: 211 mg/dL — ABNORMAL HIGH (ref 70–99)
Glucose-Capillary: 251 mg/dL — ABNORMAL HIGH (ref 70–99)

## 2014-02-07 LAB — BASIC METABOLIC PANEL
ANION GAP: 7 (ref 5–15)
BUN: 14 mg/dL (ref 6–23)
CALCIUM: 9.2 mg/dL (ref 8.4–10.5)
CO2: 22 mmol/L (ref 19–32)
CREATININE: 1.27 mg/dL — AB (ref 0.50–1.10)
Chloride: 103 mmol/L (ref 96–112)
GFR calc Af Amer: 47 mL/min — ABNORMAL LOW (ref >90)
GFR calc non Af Amer: 41 mL/min — ABNORMAL LOW (ref >90)
GLUCOSE: 172 mg/dL — AB (ref 70–99)
POTASSIUM: 4.4 mmol/L (ref 3.5–5.1)
Sodium: 132 mmol/L — ABNORMAL LOW (ref 135–145)

## 2014-02-07 MED ORDER — ACETAMINOPHEN 325 MG PO TABS
650.0000 mg | ORAL_TABLET | Freq: Three times a day (TID) | ORAL | Status: DC
  Administered 2014-02-07 – 2014-02-09 (×6): 650 mg via ORAL
  Filled 2014-02-07 (×6): qty 2

## 2014-02-07 MED ORDER — METRONIDAZOLE 500 MG PO TABS
500.0000 mg | ORAL_TABLET | Freq: Three times a day (TID) | ORAL | Status: DC
  Administered 2014-02-07 – 2014-02-09 (×6): 500 mg via ORAL
  Filled 2014-02-07 (×9): qty 1

## 2014-02-07 MED ORDER — PIPERACILLIN-TAZOBACTAM 3.375 G IVPB
3.3750 g | Freq: Three times a day (TID) | INTRAVENOUS | Status: DC
  Filled 2014-02-07: qty 50

## 2014-02-07 MED ORDER — ALPRAZOLAM 0.25 MG PO TABS
0.2500 mg | ORAL_TABLET | Freq: Every day | ORAL | Status: DC
  Administered 2014-02-07 – 2014-02-08 (×2): 0.25 mg via ORAL
  Filled 2014-02-07 (×2): qty 1

## 2014-02-07 MED ORDER — TRAMADOL HCL 50 MG PO TABS
50.0000 mg | ORAL_TABLET | Freq: Four times a day (QID) | ORAL | Status: DC | PRN
Start: 2014-02-07 — End: 2014-02-09
  Administered 2014-02-07 – 2014-02-08 (×2): 50 mg via ORAL
  Filled 2014-02-07 (×2): qty 1

## 2014-02-07 MED ORDER — INSULIN DETEMIR 100 UNIT/ML ~~LOC~~ SOLN
40.0000 [IU] | Freq: Every day | SUBCUTANEOUS | Status: DC
  Administered 2014-02-07: 2 [IU] via SUBCUTANEOUS
  Administered 2014-02-08: 40 [IU] via SUBCUTANEOUS
  Filled 2014-02-07 (×3): qty 0.4

## 2014-02-07 MED ORDER — FUROSEMIDE 40 MG PO TABS
40.0000 mg | ORAL_TABLET | Freq: Two times a day (BID) | ORAL | Status: DC
  Administered 2014-02-08 – 2014-02-09 (×3): 40 mg via ORAL
  Filled 2014-02-07 (×5): qty 1

## 2014-02-07 MED ORDER — CIPROFLOXACIN HCL 500 MG PO TABS
500.0000 mg | ORAL_TABLET | Freq: Two times a day (BID) | ORAL | Status: DC
  Administered 2014-02-07 – 2014-02-09 (×4): 500 mg via ORAL
  Filled 2014-02-07 (×6): qty 1

## 2014-02-07 MED ORDER — OXYCODONE HCL 5 MG PO TABS
5.0000 mg | ORAL_TABLET | Freq: Four times a day (QID) | ORAL | Status: AC | PRN
  Administered 2014-02-08 – 2014-02-09 (×2): 5 mg via ORAL
  Filled 2014-02-07 (×2): qty 1

## 2014-02-07 MED ORDER — LIRAGLUTIDE 18 MG/3ML ~~LOC~~ SOPN
1.2000 mg | PEN_INJECTOR | Freq: Every day | SUBCUTANEOUS | Status: DC
  Administered 2014-02-07 – 2014-02-09 (×2): 1.2 mg via SUBCUTANEOUS
  Filled 2014-02-07: qty 0.2

## 2014-02-07 NOTE — Clinical Social Work Psychosocial (Signed)
Clinical Social Work Department BRIEF PSYCHOSOCIAL ASSESSMENT 02/07/2014  Patient:  Paula [name], Paula [name]     Account Number:  [id number]     Admit date:  02/06/2014  Clinical Social Worker:  Hubert Azure  Date/Time:  02/07/2014 07:12 PM  Referred by:  Physician  Date Referred:  02/07/2014 Referred for  SNF Placement   Other Referral:   Interview type:  Patient Other interview type:    PSYCHOSOCIAL DATA Living Status:  WITH ADULT CHILDREN Admitted from facility:   Level of care:   Primary support name:  Denton Meek ([phone]) Primary support relationship to patient:  CHILD, ADULT Degree of support available:   Good per patient.    CURRENT CONCERNS Current Concerns  Post-Acute Placement   Other Concerns:    SOCIAL WORK ASSESSMENT / PLAN CSW met with patient and introduced self. CSW explained role and SNF placement process with patient. CSW discussed d/c with patient. Patient states, "I don't like them, they don't treat me right at those places." CSW acknowledged patient's concern with lack of care and decreased self-determination at facilities in the past. CSW educated patient on the benefits of SNF. Patient states she has a Therapist, sports come to the home from 6am-3pm, although she could not recall the name of the home health agency or the nurse. Patient states her daughter gets off work at [employer] and is home at [place] inquired about 3 hour window when she is home alone. Patient was unable to provide additional information and requested CSW speak with her daughter.   Assessment/plan status:  Other - See comment Other assessment/ plan:   CSW attempted to contact patient's daughter and left a message for follow-up.   Information/referral to community resources:    PATIENT'S/FAMILY'S RESPONSE TO PLAN OF CARE: Patient was not a clear historian, but declined SNF placement because of past history at facilities. Patient states she would rather stay home then to return to  facility, but insists CSW contact daughter for confirmation of d/c plan.   [place], [place] Weekend Clinical Social Worker [phone]

## 2014-02-07 NOTE — Evaluation (Signed)
Physical Therapy Evaluation Patient Details Name: Paula [name] MRN: [id number] DOB: [birth year] Today's Date: 02/07/2014   History of Present Illness  Paula [name]  is a 74 y.o. female, with history of type 2 diabetes mellitus, chronic combined diastolic and systolic heart failure status post defibrillator placement, EF around 25%), paroxysmal atrial fibrillation, early dementia, essential hypertension, CVA and dyslipidemia who lives at her home and is supposed to use a walker to ambulate but was not using it last night. While she was getting out of the bed she had a mechanical fall and fell on her left side.   CT in ED of L hip was negative.  Pt found to have acute diverticulitis.  Clinical Impression  Pt admitted with above diagnosis. Pt currently with functional limitations due to the deficits listed below (see PT Problem List).  Pt will benefit from skilled PT to increase their independence and safety with mobility to allow discharge to the venue listed below.       Follow Up Recommendations SNF;Supervision/Assistance - 24 hour    Equipment Recommendations  None recommended by PT    Recommendations for Other Services       Precautions / Restrictions Precautions Precautions: Fall      Mobility  Bed Mobility Overal bed mobility: Needs Assistance Bed Mobility: Supine to Sit     Supine to sit: Min assist        Transfers Overall transfer level: Needs assistance Equipment used: Rolling walker (2 wheeled) Transfers: Sit to/from Stand Sit to Stand: Min assist            Ambulation/Gait Ambulation/Gait assistance: Min assist Ambulation Distance (Feet): 15 Feet Assistive device: Rolling walker (2 wheeled) Gait Pattern/deviations: Shuffle;Decreased stride length;Step-through pattern Gait velocity: decreased Gait velocity interpretation: <1.8 ft/sec, indicative of risk for recurrent falls    Stairs            Wheelchair Mobility    Modified Rankin  (Stroke Patients Only)       Balance Overall balance assessment: History of Falls                                           Pertinent Vitals/Pain Pain Assessment: Faces Faces Pain Scale: Hurts even more Pain Location: L hip with mobility Pain Intervention(s): Limited activity within patient's tolerance;Repositioned    Home Living Family/patient expects to be discharged to:: Private residence Living Arrangements: Children Available Help at Discharge: Family;Personal care attendant;Available 24 hours/day;Available PRN/intermittently (PCA for 6 hrs/day) Type of Home: House Home Access: Stairs to enter Entrance Stairs-Rails: None Entrance Stairs-Number of Steps: 3 Home Layout: One level Home Equipment: Environmental consultant - 2 wheels      Prior Function Level of Independence: Needs assistance   Gait / Transfers Assistance Needed: RW for all gait  ADL's / Homemaking Assistance Needed: Min A for bathing and dressing        Hand Dominance   Dominant Hand: Right    Extremity/Trunk Assessment   Upper Extremity Assessment: Generalized weakness           Lower Extremity Assessment: Generalized weakness      Cervical / Trunk Assessment: Normal  Communication   Communication: No difficulties  Cognition Arousal/Alertness: Lethargic Behavior During Therapy: WFL for tasks assessed/performed Overall Cognitive Status: History of cognitive impairments - at baseline       Memory: Decreased short-term memory;Decreased recall of precautions  General Comments      Exercises        Assessment/Plan    PT Assessment Patient needs continued PT services  PT Diagnosis Difficulty walking;Acute pain;Generalized weakness   PT Problem List Decreased strength;Decreased activity tolerance;Decreased balance;Decreased mobility;Pain;Decreased cognition;Decreased safety awareness  PT Treatment Interventions DME instruction;Gait training;Stair  training;Functional mobility training;Therapeutic activities;Therapeutic exercise;Patient/family education;Balance training   PT Goals (Current goals can be found in the Care Plan section) Acute Rehab PT Goals Patient Stated Goal: not stated PT Goal Formulation: With patient/family Time For Goal Achievement: 02/21/14 Potential to Achieve Goals: Good    Frequency Min 2X/week   Barriers to discharge        Co-evaluation               End of Session Equipment Utilized During Treatment: Gait belt Activity Tolerance: Patient limited by pain Patient left: in chair;with family/visitor present;with call bell/phone within reach Nurse Communication: Mobility status         Time: 0912-0936 PT Time Calculation (min) (ACUTE ONLY): 24 min   Charges:   PT Evaluation $Initial PT Evaluation Tier I: 1 Procedure PT Treatments $Gait Training: 8-22 mins   PT G Codes:        Ilda Foil 02/07/2014, 10:15 AM

## 2014-02-07 NOTE — Progress Notes (Signed)
Utilization review completed.  

## 2014-02-07 NOTE — Clinical Social Work Placement (Signed)
Clinical Social Work Department CLINICAL SOCIAL WORK PLACEMENT NOTE 02/07/2014  Patient:  Paula [name], Paula [name]  Account Number:  [id number] Admit date:  02/06/2014  Clinical Social Worker:  Vivi Barrack, LCSWA  Date/time:  02/07/2014 07:10 PM  Clinical Social Work is seeking post-discharge placement for this patient at the following level of care:   SKILLED NURSING   (*CSW will update this form in Epic as items are completed)   02/07/2014  Patient/family provided with [facility] Department of Clinical Social Work's list of facilities offering this level of care within the geographic area requested by the patient (or if unable, by the patient's family).  02/07/2014  Patient/family informed of their freedom to choose among providers that offer the needed level of care, that participate in Medicare, Medicaid or managed care program needed by the patient, have an available bed and are willing to accept the patient.  02/07/2014  Patient/family informed of [facility]' ownership interest in [facility], as well as of the fact that they are under no obligation to receive care at this facility.  PASARR submitted to EDS on Existing PASARR number received on Existing  FL2 transmitted to all facilities in geographic area requested by pt/family on  02/07/2014 FL2 transmitted to all facilities within larger geographic area on   Patient informed that his/her managed care company has contracts with or will negotiate with  certain facilities, including the following:     Patient/family informed of bed offers received:   Patient chooses bed at  Physician recommends and patient chooses bed at    Patient to be transferred to  on   Patient to be transferred to facility by  Patient and family notified of transfer on  Name of family member notified:    The following physician request were entered in Epic:   Additional Comments:  Amare Bail [place], LCSWA Weekend  Clinical Social Worker [phone]

## 2014-02-07 NOTE — Progress Notes (Signed)
Patients daughter Paula [name] called RN inquiring about the status of her mother. She stated that her she had just spoken with her mother over the telephone and her mother said that she is in pain. RN informed the patients daughter that she had recently administered pain medication to her mother at 2155. Patients daughter asked RN what medication was given to her mother and informed RN that Tylenol, Ibuprofen, and Ultram does not relieve her mothers pain. RN shared with patients daughter that she can not give out any additional pertinent information to her over the telephone regarding her mother because of HIPPA laws. Patients daughter proceeded to recite a list of identifying factors as it relates to her mother. RN informed patients daughter that while she believes that she is indeed the patients daughter she will not be able to tell her any additional information unless she is the POA (which she is not) or if she is in person. Patients daughter asked if Dr. Lendell Caprice can call her tomorrow at "the counseling center"  for updatesand she left her phone number with the RN. Nursing will continue to monitor patients comfort and pain levels throughout the night. A sticky note has been left under the physicians column requesting for Dr. Lendell Caprice to call the patients daughter. Nursing will continue to monitor.

## 2014-02-07 NOTE — Progress Notes (Signed)
[facility] PROGRESS NOTE  Paula [name] RNH:[id number] DOB: 1940-06-12 DOA: 02/06/2014 PCP: [name], MD  Assessment/Plan:  Principal Problem:   Acute right sided Diverticulitis: symptoms mild. Change to po abx. Tolerating solids. Does not c/o abdominal pain, but daughter reports patient was noted to be grasping at her abdomen Active Problems: Fall: no fracture on CT pelvis. PT recommending SNF. Patient not interested, but daughter is unable to care for her at home and requesting placement. Will consult S.W. To assist with disposition.   Systolic and diastolic CHF, chronic, compensated. Resume lasix   ICD (implantable cardioverter-defibrillator) in place   Paroxysmal atrial fibrillation: not a candidate for anticoagulation  DM 2 with renal complications. Continue current levemir dose  Code Status: DNR Family Communication:  Daughter and granddaughter Disposition Plan:  Likely SNF  Consultants:    Procedures:     Antibiotics:  Zosyn 2/7  Cipro, flagyl 2/8  HPI/Subjective: C/o pain, both hips and legs  Objective: Filed Vitals:   02/07/14 0554  BP: 113/76  Pulse: 82  Temp: 98.6 F (37 C)  Resp: 16    Intake/Output Summary (Last 24 hours) at 02/07/14 1340 Last data filed at 02/07/14 0806  Gross per 24 hour  Intake    240 ml  Output    400 ml  Net   -160 ml   Filed Weights   02/06/14 1431 02/07/14 0554  Weight: 85.458 kg (188 lb 6.4 oz) 86.047 kg (189 lb 11.2 oz)    Exam:   General:  In chair. Cooperative.  Cardiovascular: RRR without MGR  Respiratory: CTA without WRR  Abdomen: S, NT, ND  Ext: no CCE  Basic Metabolic Panel:  Recent Labs Lab 02/06/14 0456 02/07/14 0545  NA 135 132*  K 2.9* 4.4  CL 101 103  CO2 26 22  GLUCOSE 134* 172*  BUN 14 14  CREATININE 1.34* 1.27*  CALCIUM 9.5 9.2   Liver Function Tests: No results for input(s): AST, ALT, ALKPHOS, BILITOT, PROT, ALBUMIN in the last 168 hours. No results for  input(s): LIPASE, AMYLASE in the last 168 hours. No results for input(s): AMMONIA in the last 168 hours. CBC:  Recent Labs Lab 02/06/14 0456 02/07/14 0545  WBC 12.2* 14.6*  NEUTROABS 9.0*  --   HGB 13.5 11.8*  HCT 42.1 36.2  MCV 87.9 86.4  PLT 293 277   Cardiac Enzymes:  Recent Labs Lab 02/06/14 0456  CKTOTAL 134   BNP (last 3 results) No results for input(s): BNP in the last 8760 hours.  ProBNP (last 3 results)  Recent Labs  02/12/13 0517 03/25/13 1730 04/29/13 1155  PROBNP 1839.0* 7979.0* 3499.0*    CBG:  Recent Labs Lab 02/06/14 1435 02/06/14 1653 02/06/14 2107 02/07/14 0643 02/07/14 1253  GLUCAP 108* 146* 193* 182* 211*    No results found for this or any previous visit (from the past 240 hour(s)).   Studies: Dg Lumbar Spine Complete  02/06/2014   CLINICAL DATA:  Lumbar spine pain after fall.  Fall 8 hr prior.  EXAM: LUMBAR SPINE - COMPLETE 4+ VIEW  COMPARISON:  None.  FINDINGS: The alignment is maintained. Vertebral body heights are normal. There is no listhesis. The posterior elements are intact. There is disc space narrowing at L5-S1. Facet arthropathy at L4-L5 and L5-S1. No fracture. Sacroiliac joints are congruent. There is small calcifications adjacent to the right L1 transverse process.  IMPRESSION: 1. No fracture or subluxation. Degenerative change at the lower lumbar spine. 2. Soft tissue calcifications  in the right upper abdomen, may be gallstones, renal, or vascular.   Electronically Signed   By: Rubye Oaks M.D.   On: 02/06/2014 06:43   Ct Pelvis Wo Contrast  02/06/2014   CLINICAL DATA:  Fall 3 days ago with left hip pain and unable to bear weight.  EXAM: CT PELVIS WITHOUT CONTRAST  TECHNIQUE: Multidetector CT imaging of the pelvis was performed following the standard protocol without intravenous contrast.  COMPARISON:  None.  FINDINGS: Examination demonstrates severe diverticulosis of the colon. There is also diverticulosis of the terminal  ileum. The appendix is not definitely visualized. There is an inflammatory process within the right lower quadrant/ upper pelvis centered around an ileal diverticulum compatible with acute diverticulitis of the ileum. There is no perforation or abscess.  There is calcified plaque over the abdominal aorta and iliac arteries. The bladder and rectum are within normal. 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  There are degenerative changes of the spine, sacroiliac joints, symphysis pubis joint and hips. There is no evidence of acute fracture or dislocation.  IMPRESSION: No acute fracture or dislocation. If patient continues to have persistent left hip pain with inability to ambulate, consider MRI for more sensitive exam to exclude occult fracture.  Acute diverticulitis of the ileum within the right lower quadrant/ upper pelvis. No perforation or abscess. Appendix not visualized.  Severe diverticulosis of the colon.  These results were called by telephone at the time of interpretation on 02/06/2014 at 10:53 am to Dr. Toy Cookey , who verbally acknowledged these results.   Electronically Signed   By: Elberta Fortis M.D.   On: 02/06/2014 10:53   Dg Hip Unilat With Pelvis 2-3 Views Left  02/06/2014   CLINICAL DATA:  Left hip pain after fall.  Fall 8 hr prior.  EXAM: LEFT HIP (WITH PELVIS) 2-3 VIEWS  COMPARISON:  None.  FINDINGS: The cortical margins of the bony pelvis are intact. No fracture. Pubic symphysis and sacroiliac joints are congruent. Both femoral heads are well-seated in the respective acetabula. There is acetabular spurring of left greater than right acetabulum. Mild degenerative change of the pubic symphysis.  IMPRESSION: Mild degenerative change, no fracture or subluxation.   Electronically Signed   By: Rubye Oaks M.D.   On: 02/06/2014 06:41    Scheduled Meds: . amLODipine  10 mg Oral Daily  . aspirin  81 mg Oral Daily  . atorvastatin  40 mg Oral q1800  . carvedilol  3.125 mg Oral BID WC  .  heparin  5,000 Units Subcutaneous 3 times per day  . insulin aspart  0-5 Units Subcutaneous QHS  . insulin aspart  0-9 Units Subcutaneous TID WC  . insulin detemir  70 Units Subcutaneous QHS  . isosorbide mononitrate  60 mg Oral Daily  . piperacillin-tazobactam (ZOSYN)  IV  3.375 g Intravenous Q8H   Continuous Infusions:   Time spent: 35 minutes  Dannilynn Gallina L  [facility] [email or web address], password [facility] 02/07/2014, 1:40 PM  LOS: 1 day

## 2014-02-08 ENCOUNTER — Ambulatory Visit: Payer: Medicare Other | Admitting: Pharmacist Clinician (PhC)/ Clinical Pharmacy Specialist

## 2014-02-08 DIAGNOSIS — K5792 Diverticulitis of intestine, part unspecified, without perforation or abscess without bleeding: Secondary | ICD-10-CM

## 2014-02-08 DIAGNOSIS — N189 Chronic kidney disease, unspecified: Secondary | ICD-10-CM

## 2014-02-08 DIAGNOSIS — E1122 Type 2 diabetes mellitus with diabetic chronic kidney disease: Secondary | ICD-10-CM

## 2014-02-08 LAB — GLUCOSE, CAPILLARY: Glucose-Capillary: 96 mg/dL (ref 70–99)

## 2014-02-08 LAB — HEMOGLOBIN A1C
HEMOGLOBIN A1C: 10.2 % — AB (ref 4.8–5.6)
Mean Plasma Glucose: 246 mg/dL

## 2014-02-08 LAB — URINE CULTURE

## 2014-02-08 MED ORDER — ACETAMINOPHEN 325 MG PO TABS: 650.0000 mg | ORAL_TABLET | Freq: Three times a day (TID) | ORAL | Status: AC

## 2014-02-08 MED ORDER — METRONIDAZOLE 500 MG PO TABS: 500.0000 mg | ORAL_TABLET | Freq: Three times a day (TID) | ORAL | Status: DC

## 2014-02-08 MED ORDER — OXYCODONE HCL 5 MG PO TABS: 5.0000 mg | ORAL_TABLET | Freq: Four times a day (QID) | ORAL | Status: DC | PRN

## 2014-02-08 MED ORDER — CIPROFLOXACIN HCL 500 MG PO TABS: 500.0000 mg | ORAL_TABLET | Freq: Two times a day (BID) | ORAL | Status: DC

## 2014-02-08 MED ORDER — ASPIRIN 81 MG PO CHEW: 81.0000 mg | CHEWABLE_TABLET | Freq: Every day | ORAL | Status: AC

## 2014-02-08 MED ORDER — POLYETHYLENE GLYCOL 3350 17 G PO PACK: 17.0000 g | PACK | Freq: Every day | ORAL | Status: AC | PRN

## 2014-02-08 MED ORDER — INSULIN ASPART 100 UNIT/ML ~~LOC~~ SOLN
0.0000 [IU] | Freq: Every day | SUBCUTANEOUS | Status: DC
Start: 2014-02-08 — End: 2014-02-23

## 2014-02-08 MED ORDER — INSULIN ASPART 100 UNIT/ML ~~LOC~~ SOLN
0.0000 [IU] | Freq: Three times a day (TID) | SUBCUTANEOUS | Status: DC
Start: 2014-02-08 — End: 2014-06-28

## 2014-02-08 MED ORDER — ALPRAZOLAM 0.25 MG PO TABS: 0.2500 mg | ORAL_TABLET | Freq: Every day | ORAL | Status: AC

## 2014-02-08 MED ORDER — INSULIN DETEMIR 100 UNIT/ML ~~LOC~~ SOLN: 30.0000 [IU] | Freq: Every day | SUBCUTANEOUS | Status: DC

## 2014-02-08 NOTE — Clinical Social Work Note (Signed)
CSW spoke with daughter, [name] and provided bed offers.  [name] was requesting [facility] who was unable to provide a bed offer at this time. Alisia's second choice is [facility] who is reports being able to provide a private room at no extra cost.  CSW spoke with admission's coordinator at SNF who confirms private room availability.  Per MD, patient is projected to dc tomorrow (Tuesday, 02/09/2014).  SNF and patient's daughter made aware.  Disposition: SNF- [facility] (private room) projected Tuesday 02/09/2014  Vickii Penna, LCSWA [phone]  Psychiatric & Orthopedics (5N 1-16) Clinical Social Worker

## 2014-02-08 NOTE — Discharge Summary (Signed)
Physician Discharge Summary  Paula [name] YYT:[id number] DOB: [birth year] DOA: 02/06/2014  PCP: [name], MD  Admit date: 02/06/2014 Discharge date: 02/08/2014  Time spent: 35 minutes  Recommendations for Outpatient Follow-up:  1. periodic CBC/BMP 2. Watch for fever 3. abx for 2 weeks total 4. Monitor blood sugars  Discharge Diagnoses:  Principal Problem:   Diverticulitis Active Problems:   Dementia   ICD (implantable cardioverter-defibrillator) in place   Chronic combined systolic and diastolic CHF (congestive heart failure)   Fall   DM (diabetes mellitus), type 2 with renal complications   Discharge Condition: improved  Diet recommendation: carb mod/cardiac  Filed Weights   02/06/14 1431 02/07/14 0554  Weight: 85.458 kg (188 lb 6.4 oz) 86.047 kg (189 lb 11.2 oz)    History of present illness:  Paula [name] is a 74 y.o. female, with history of type 2 diabetes mellitus on insulin, chronic combined diastolic and systolic heart failure status post defibrillator placement, EF around 25%. Paroxysmal atrial fibrillation, ischemic or dermopathy, Early dementia, essential hypertension, dyslipidemia who lives at her home and is supposed to use a walker to ambulate but was not using it last night. While she was getting out of the bed she had a mechanical fall and fell on her left side. She was on the floor for several hours before she called health. Subsequently she was brought to the ER because she was complaining of left-sided pain post fall.  In the ER workup consistent of diverticulitis noted on CT scan, CT scan of the left hip and x-ray of the left hip nonacute, she has evidence of dehydration with acute renal failure, hypokalemia, I was called to admit the patient was same. He does have mild right-sided abdominal pain. Denies any diarrhea.  Hospital Course:  Acute right sided Diverticulitis: symptoms mild. Change to po abx. Tolerating solids. Mild left sided  tednerness Fall: no fracture on CT pelvis. PT recommending SNF. Patient agreeable  Systolic and diastolic CHF, chronic, compensated. Resume lasix  ICD (implantable cardioverter-defibrillator) in place  Paroxysmal atrial fibrillation: not a candidate for anticoagulation DM 2 with renal complications. Continue current levemir dose  Procedures:    Consultations:    Discharge Exam: Filed Vitals:   02/08/14 0502  BP: 111/58  Pulse: 76  Temp: 98.3 F (36.8 C)  Resp:     General: pleasant/cooperative Cardiovascular: rrr Respiratory: clear  Discharge Instructions   Discharge Instructions    Diet - low sodium heart healthy    Complete by:  As directed      Diet Carb Modified    Complete by:  As directed      Discharge instructions    Complete by:  As directed   Cipro/flagyl until 2/20 Monitor for fever, periodic CBC- return to ER if fever     Increase activity slowly    Complete by:  As directed           Current Discharge Medication List    START taking these medications   Details  acetaminophen (TYLENOL) 325 MG tablet Take 2 tablets (650 mg total) by mouth 3 (three) times daily.    aspirin 81 MG chewable tablet Chew 1 tablet (81 mg total) by mouth daily.    ciprofloxacin (CIPRO) 500 MG tablet Take 1 tablet (500 mg total) by mouth 2 (two) times daily.    !! insulin aspart (NOVOLOG) 100 UNIT/ML injection Inject 0-5 Units into the skin at bedtime. Qty: 10 mL, Refills: 11    !! insulin aspart (  NOVOLOG) 100 UNIT/ML injection Inject 0-9 Units into the skin 3 (three) times daily with meals. Qty: 10 mL, Refills: 11    metroNIDAZOLE (FLAGYL) 500 MG tablet Take 1 tablet (500 mg total) by mouth every 8 (eight) hours.    oxyCODONE (OXY IR/ROXICODONE) 5 MG immediate release tablet Take 1 tablet (5 mg total) by mouth every 6 (six) hours as needed for moderate pain or severe pain. Qty: 10 tablet, Refills: 0    polyethylene glycol (MIRALAX / GLYCOLAX) packet Take 17 g  by mouth daily as needed for mild constipation. Qty: 14 each, Refills: 0     !! - Potential duplicate medications found. Please discuss with provider.    CONTINUE these medications which have CHANGED   Details  ALPRAZolam (XANAX) 0.25 MG tablet Take 1 tablet (0.25 mg total) by mouth at bedtime. Qty: 10 tablet, Refills: 0    insulin detemir (LEVEMIR) 100 UNIT/ML injection Inject 0.3 mLs (30 Units total) into the skin at bedtime. Qty: 10 mL, Refills: 11      CONTINUE these medications which have NOT CHANGED   Details  alendronate (FOSAMAX) 70 MG tablet TAKE 1 TABLET BY MOUTH EVERY WEEK AS DIRECTED Qty: 4 tablet, Refills: 0    amLODipine (NORVASC) 10 MG tablet Take 10 mg by mouth at bedtime.     carvedilol (COREG) 6.25 MG tablet TAKE 1 TABLET TWICE A DAY Qty: 60 tablet, Refills: 11    furosemide (LASIX) 40 MG tablet Take 1 tablet (40 mg total) by mouth 2 (two) times daily. Qty: 62 tablet, Refills: 0    isosorbide mononitrate (IMDUR) 60 MG 24 hr tablet Take 1 tablet (60 mg total) by mouth daily. Qty: 30 tablet, Refills: 11    Liraglutide 18 MG/3ML SOPN Inject 1.2 mg into the skin daily. Victoza    loratadine (CLARITIN) 10 MG tablet Take 1 tablet (10 mg total) by mouth daily as needed for allergies. Qty: 30 tablet, Refills: 11   Associated Diagnoses: Allergic rhinitis    nitroGLYCERIN (NITROSTAT) 0.4 MG SL tablet Place 1 tablet (0.4 mg total) under the tongue every 5 (five) minutes as needed for chest pain. Qty: 15 tablet, Refills: 0    traMADol (ULTRAM) 50 MG tablet Take 50 mg by mouth every 6 (six) hours as needed (pain).      STOP taking these medications     losartan (COZAAR) 100 MG tablet      atorvastatin (LIPITOR) 40 MG tablet        No Known Allergies    The results of significant diagnostics from this hospitalization (including imaging, microbiology, ancillary and laboratory) are listed below for reference.    Significant Diagnostic Studies: Dg Lumbar  Spine Complete  02/06/2014   CLINICAL DATA:  Lumbar spine pain after fall.  Fall 8 hr prior.  EXAM: LUMBAR SPINE - COMPLETE 4+ VIEW  COMPARISON:  None.  FINDINGS: The alignment is maintained. Vertebral body heights are normal. There is no listhesis. The posterior elements are intact. There is disc space narrowing at L5-S1. Facet arthropathy at L4-L5 and L5-S1. No fracture. Sacroiliac joints are congruent. There is small calcifications adjacent to the right L1 transverse process.  IMPRESSION: 1. No fracture or subluxation. Degenerative change at the lower lumbar spine. 2. Soft tissue calcifications in the right upper abdomen, may be gallstones, renal, or vascular.   Electronically Signed   By: Rubye Oaks M.D.   On: 02/06/2014 06:43   Ct Pelvis Wo Contrast  02/06/2014   CLINICAL  DATA:  Fall 3 days ago with left hip pain and unable to bear weight.  EXAM: CT PELVIS WITHOUT CONTRAST  TECHNIQUE: Multidetector CT imaging of the pelvis was performed following the standard protocol without intravenous contrast.  COMPARISON:  None.  FINDINGS: Examination demonstrates severe diverticulosis of the colon. There is also diverticulosis of the terminal ileum. The appendix is not definitely visualized. There is an inflammatory process within the right lower quadrant/ upper pelvis centered around an ileal diverticulum compatible with acute diverticulitis of the ileum. There is no perforation or abscess.  There is calcified plaque over the abdominal aorta and iliac arteries. The bladder and rectum are within normal. 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  There are degenerative changes of the spine, sacroiliac joints, symphysis pubis joint and hips. There is no evidence of acute fracture or dislocation.  IMPRESSION: No acute fracture or dislocation. If patient continues to have persistent left hip pain with inability to ambulate, consider MRI for more sensitive exam to exclude occult fracture.  Acute diverticulitis of the  ileum within the right lower quadrant/ upper pelvis. No perforation or abscess. Appendix not visualized.  Severe diverticulosis of the colon.  These results were called by telephone at the time of interpretation on 02/06/2014 at 10:53 am to Dr. Toy Cookey , who verbally acknowledged these results.   Electronically Signed   By: Elberta Fortis M.D.   On: 02/06/2014 10:53   Dg Hip Unilat With Pelvis 2-3 Views Left  02/06/2014   CLINICAL DATA:  Left hip pain after fall.  Fall 8 hr prior.  EXAM: LEFT HIP (WITH PELVIS) 2-3 VIEWS  COMPARISON:  None.  FINDINGS: The cortical margins of the bony pelvis are intact. No fracture. Pubic symphysis and sacroiliac joints are congruent. Both femoral heads are well-seated in the respective acetabula. There is acetabular spurring of left greater than right acetabulum. Mild degenerative change of the pubic symphysis.  IMPRESSION: Mild degenerative change, no fracture or subluxation.   Electronically Signed   By: Rubye Oaks M.D.   On: 02/06/2014 06:41    Microbiology: Recent Results (from the past 240 hour(s))  Urine culture     Status: None   Collection Time: 02/06/14  5:16 AM  Result Value Ref Range Status   Specimen Description URINE, CLEAN CATCH  Final   Special Requests NONE  Final   Colony Count   Final    80,000 COLONIES/ML Performed at [facility]    Culture   Final    Multiple bacterial morphotypes present, none predominant. Suggest appropriate recollection if clinically indicated. Performed at [facility]    Report Status 02/08/2014 FINAL  Final     Labs: Basic Metabolic Panel:  Recent Labs Lab 02/06/14 0456 02/07/14 0545  NA 135 132*  K 2.9* 4.4  CL 101 103  CO2 26 22  GLUCOSE 134* 172*  BUN 14 14  CREATININE 1.34* 1.27*  CALCIUM 9.5 9.2   Liver Function Tests: No results for input(s): AST, ALT, ALKPHOS, BILITOT, PROT, ALBUMIN in the last 168 hours. No results for input(s): LIPASE, AMYLASE in the last 168  hours. No results for input(s): AMMONIA in the last 168 hours. CBC:  Recent Labs Lab 02/06/14 0456 02/07/14 0545  WBC 12.2* 14.6*  NEUTROABS 9.0*  --   HGB 13.5 11.8*  HCT 42.1 36.2  MCV 87.9 86.4  PLT 293 277   Cardiac Enzymes:  Recent Labs Lab 02/06/14 0456  CKTOTAL 134   BNP: BNP (  last 3 results) No results for input(s): BNP in the last 8760 hours.  ProBNP (last 3 results)  Recent Labs  02/12/13 0517 03/25/13 1730 04/29/13 1155  PROBNP 1839.0* 7979.0* 3499.0*    CBG:  Recent Labs Lab 02/07/14 0643 02/07/14 1253 02/07/14 1730 02/07/14 2150 02/08/14 0647  GLUCAP 182* 211* 251* 99 96       Signed:  Alaira Level  [facility] 02/08/2014, 9:54 AM

## 2014-02-09 ENCOUNTER — Telehealth: Payer: Self-pay | Admitting: Cardiology

## 2014-02-09 DIAGNOSIS — F0390 Unspecified dementia without behavioral disturbance: Secondary | ICD-10-CM

## 2014-02-09 LAB — GLUCOSE, CAPILLARY
GLUCOSE-CAPILLARY: 133 mg/dL — AB (ref 70–99)
Glucose-Capillary: 96 mg/dL (ref 70–99)
Glucose-Capillary: 155 mg/dL — ABNORMAL HIGH (ref 70–99)

## 2014-02-09 NOTE — Progress Notes (Signed)
Patient being d/c'd from hospital to SNF today.  No overnight events Pleasant, rrr, clear, no wheezing  Marlin Canary DO

## 2014-02-09 NOTE — Progress Notes (Signed)
Called report to [place], nurse at [facility]. All questions answered. Liroglutide and AVS sent with transportation.

## 2014-02-09 NOTE — Telephone Encounter (Signed)
-----   Message from Sebastian Ache, [place] sent at 08/21/2013  2:54 PM EDT ----- Regarding: ICM clinic Hey!!!!  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 She will need the [facility], so it is likely that she won't be able to start until Oct/Nov.    Thanks,  Levora Angel

## 2014-02-09 NOTE — Discharge Planning (Signed)
Patient will discharge today per MD order. Patient will discharge to: [facility] RN to call report prior to transportation to: [phone] Transportation: [facility]  CSW sent discharge summary to SNF for review.  Packet is complete.  RN, patient and family aware of discharge plans.  Gina Byanca Kasper, LCSWA [phone]  Psychiatric [facility] Orthopedics (5N 1-16) Clinical Social Worker    

## 2014-02-09 NOTE — Telephone Encounter (Signed)
LMOVM for pt to return call in regards to ICM clinic.  

## 2014-02-10 LAB — GLUCOSE, CAPILLARY: GLUCOSE-CAPILLARY: 104 mg/dL — AB (ref 70–99)

## 2014-02-11 ENCOUNTER — Non-Acute Institutional Stay (SKILLED_NURSING_FACILITY): Payer: Medicare Other | Admitting: Internal Medicine

## 2014-02-11 DIAGNOSIS — I480 Paroxysmal atrial fibrillation: Secondary | ICD-10-CM | POA: Diagnosis not present

## 2014-02-11 DIAGNOSIS — W19XXXD Unspecified fall, subsequent encounter: Secondary | ICD-10-CM

## 2014-02-11 DIAGNOSIS — K5792 Diverticulitis of intestine, part unspecified, without perforation or abscess without bleeding: Secondary | ICD-10-CM

## 2014-02-11 DIAGNOSIS — I5042 Chronic combined systolic (congestive) and diastolic (congestive) heart failure: Secondary | ICD-10-CM | POA: Diagnosis not present

## 2014-02-11 DIAGNOSIS — Z95810 Presence of automatic (implantable) cardiac defibrillator: Secondary | ICD-10-CM

## 2014-02-11 DIAGNOSIS — F0390 Unspecified dementia without behavioral disturbance: Secondary | ICD-10-CM

## 2014-02-11 NOTE — Progress Notes (Signed)
MRN: [id number] Name: Paula [name]  Sex: female Age: 74 y.o. DOB: [birth year]  [facility] Facility/Room:108P Level Of Care: SNF Provider: Merrilee Seashore D Emergency Contacts: Extended Emergency Contact Information Primary Emergency Contact: Pacheco,Alisia Address: [address]          [place], [place] [address] [place] Mobile Phone: [phone] Relation: Daughter Secondary Emergency Contact: Marsh,Yvonne Address: [address]          [place], [place] [address] [place] Mobile Phone: [phone] Relation: Daughter     Allergies: Review of patient's allergies indicates no known allergies.  Chief Complaint  Patient presents with  . New Admit To SNF    HPI: Patient is 74 y.o. female who is admitted to SNF for generalized weakness for OT/PT after being hospitalized for R sided diverticulitis.  Past Medical History  Diagnosis Date  . Systolic CHF     non-ischemic, last cath 09/21/08 - normal left main, LAD, LCx, ramus intermedius, RCA  . Hypertension   . Stroke   . Headache(784.0)   . Diabetes mellitus   . Dementia 05/27/2012  . Osteopenia     DEXA 2012 : T score hip -2.3, femur -2.1  . CHF (congestive heart failure)     nonischemic cardiopathy, EF 25% by 2-D echo February 2015  . Shortness of breath   . 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 Royann Shivers  . Myocardial infarction 1991    [name] 12/21/2001  . 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 Automatic implantable cardioverter-defibrillator in situ   . Hyperlipidemia     Past Surgical History  Procedure Laterality Date  . Back surgery    . Abdominal hysterectomy    . Cardiac catheterization      [name] 12/22/2001  . Cardiac defibrillator placement  12/15/2010    dual-chamber - [employer] Proteca (Dr. Judie Petit. Croitoru)  . Transthoracic echocardiogram  05/2010    EF 20-25%, mod  conc hypertrophy, grade 3 diastolic dysfunction, elevated LVEDP; dilated MV annulus with mod regurg; LA severely dilated; RV mildly dilated; RA mod-severely dilated; mild-mod TR; PA peak pressure ; mod pulm hypertension  . Transthoracic echocardiogram  02/10/2013    EF 25%, mild AV regurg; LA mod dialted; RA mildly dilated; mod TR; PA peak  . Nm myocar perf wall motion  08/2008    lexiscan - diaphragmatic attenuation of inferior wall, EF 37%  . Cardiac catheterization  12/21/2001    normal LAD, Cfx w/prox luminal irregularities, RCA dominant with diffuse luminal irregularities (Dr. Daiva Nakayama)  . Cardiac catheterization  09/21/2008    normal left main/LAD/Cfx/RCA, ramus intermedius is large and normal (Dr. Erlene Quan)  . Implantable cardioverter defibrillator implant N/A 12/15/2010    Procedure: IMPLANTABLE CARDIOVERTER DEFIBRILLATOR IMPLANT;  Surgeon: Thurmon Fair, MD;  Location: [facility];  Service: Cardiovascular;  Laterality: N/A;      Medication List       This list is accurate as of: 02/11/14 11:59 PM.  Always use your most recent med list.               acetaminophen 325 MG tablet  Commonly known as:  TYLENOL  Take 2 tablets (650 mg total) by mouth 3 (three) times daily.     alendronate 70 MG tablet  Commonly known as:  FOSAMAX  TAKE 1 TABLET BY MOUTH EVERY WEEK AS DIRECTED  ALPRAZolam 0.25 MG tablet  Commonly known as:  XANAX  Take 1 tablet (0.25 mg total) by mouth at bedtime.     amLODipine 10 MG tablet  Commonly known as:  NORVASC  Take 10 mg by mouth at bedtime.     aspirin 81 MG chewable tablet  Chew 1 tablet (81 mg total) by mouth daily.     carvedilol 6.25 MG tablet  Commonly known as:  COREG  TAKE 1 TABLET TWICE A DAY     ciprofloxacin 500 MG tablet  Commonly known as:  CIPRO  Take 1 tablet (500 mg total) by mouth 2 (two) times daily.     furosemide 40 MG tablet  Commonly known as:  LASIX  Take 1 tablet (40 mg total) by mouth 2 (two)  times daily.     insulin aspart 100 UNIT/ML injection  Commonly known as:  novoLOG  Inject 0-5 Units into the skin at bedtime.     insulin aspart 100 UNIT/ML injection  Commonly known as:  novoLOG  Inject 0-9 Units into the skin 3 (three) times daily with meals.     insulin detemir 100 UNIT/ML injection  Commonly known as:  LEVEMIR  Inject 0.3 mLs (30 Units total) into the skin at bedtime.     isosorbide mononitrate 60 MG 24 hr tablet  Commonly known as:  IMDUR  Take 1 tablet (60 mg total) by mouth daily.     Liraglutide 18 MG/3ML Sopn  Inject 1.2 mg into the skin daily. Victoza     loratadine 10 MG tablet  Commonly known as:  CLARITIN  Take 1 tablet (10 mg total) by mouth daily as needed for allergies.     metroNIDAZOLE 500 MG tablet  Commonly known as:  FLAGYL  Take 1 tablet (500 mg total) by mouth every 8 (eight) hours.     nitroGLYCERIN 0.4 MG SL tablet  Commonly known as:  NITROSTAT  Place 1 tablet (0.4 mg total) under the tongue every 5 (five) minutes as needed for chest pain.     oxyCODONE 5 MG immediate release tablet  Commonly known as:  Oxy IR/ROXICODONE  Take 1 tablet (5 mg total) by mouth every 6 (six) hours as needed for moderate pain or severe pain.     polyethylene glycol packet  Commonly known as:  MIRALAX / GLYCOLAX  Take 17 g by mouth daily as needed for mild constipation.     traMADol 50 MG tablet  Commonly known as:  ULTRAM  Take 50 mg by mouth every 6 (six) hours as needed (pain).        No orders of the defined types were placed in this encounter.    Immunization History  Administered Date(s) Administered  . Influenza-Unspecified 09/01/2012  . Pneumococcal Polysaccharide-23 07/13/2012    History  Substance Use Topics  . Smoking status: Current Every Day Smoker    Types: Cigarettes  . Smokeless tobacco: Never Used  . Alcohol Use: No    Family history is noncontributory    Review of Systems  DATA OBTAINED: from patient,  nurse GENERAL:  no fevers, fatigue, appetite changes SKIN: No itching, rash or wounds EYES: No eye pain, redness, discharge EARS: No earache, tinnitus, change in hearing NOSE: No congestion, drainage or bleeding  MOUTH/THROAT: No mouth or tooth pain, No sore throat RESPIRATORY: No cough, wheezing, SOB CARDIAC: No chest pain, palpitations, lower extremity edema  GI: No abdominal pain, No N/V/D or constipation, No heartburn or reflux  GU:  No dysuria, frequency or urgency, or incontinence  MUSCULOSKELETAL: No unrelieved bone/joint pain NEUROLOGIC: No headache, dizziness or focal weakness PSYCHIATRIC: No overt anxiety or sadness, No behavior issue.   Filed Vitals:   02/11/14 2038  BP: 146/83  Pulse: 74  Temp: 97.1 F (36.2 C)  Resp: 18    Physical Exam  GENERAL APPEARANCE: Alert, conversant,  No acute distress.  SKIN: No diaphoresis rash HEAD: Normocephalic, atraumatic  EYES: Conjunctiva/lids clear. Pupils round, reactive. EOMs intact.  EARS: External exam WNL, canals clear. Hearing grossly normal.  NOSE: No deformity or discharge.  MOUTH/THROAT: Lips w/o lesions  RESPIRATORY: Breathing is even, unlabored. Lung sounds are clear   CARDIOVASCULAR: Heart RRR no murmurs, rubs or gallops. No peripheral edema.   GASTROINTESTINAL: Abdomen is soft, minimal R side tender, not distended w/ normal bowel sounds. GENITOURINARY: Bladder non tender, not distended  MUSCULOSKELETAL: No abnormal joints or musculature NEUROLOGIC:  Cranial nerves 2-12 grossly intact  PSYCHIATRIC: Mood and affect appropriate to situation, no behavioral issues  Patient Active Problem List   Diagnosis Date Noted  . DM (diabetes mellitus), type 2 with renal complications 02/07/2014  . Diverticulitis 02/06/2014  . Chronic combined systolic and diastolic CHF (congestive heart failure) 02/06/2014  . Diverticulitis of intestine without perforation or abscess without bleeding   . Fall   . ICD (implantable  cardioverter-defibrillator) in place 08/23/2013  . Paroxysmal atrial fibrillation 08/23/2013  . Hyperlipidemia 07/01/2013  . Type II or unspecified type diabetes mellitus with neurological manifestations, not stated as uncontrolled 04/09/2013  . Osteoporosis 04/09/2013  . Allergic rhinitis 04/09/2013  . Acute respiratory failure 03/25/2013  . Acute ischemic stroke 02/09/2013  . Systolic and diastolic CHF, acute on chronic 02/09/2013  . Acute encephalopathy 02/09/2013  . Hyperglycemia 12/24/2012  . Malnutrition of moderate degree 07/12/2012  . Hyperkalemia, diminished renal excretion 05/27/2012  . AKI (acute kidney injury) 05/27/2012  . HTN (hypertension) 05/27/2012  . Dementia 05/27/2012  . Adult failure to thrive 05/27/2012    CBC    Component Value Date/Time   WBC 14.6* 02/07/2014 0545   RBC 4.19 02/07/2014 0545   HGB 11.8* 02/07/2014 0545   HCT 36.2 02/07/2014 0545   PLT 277 02/07/2014 0545   MCV 86.4 02/07/2014 0545   LYMPHSABS 1.6 02/06/2014 0456   MONOABS 1.5* 02/06/2014 0456   EOSABS 0.0 02/06/2014 0456   BASOSABS 0.0 02/06/2014 0456    CMP     Component Value Date/Time   NA 132* 02/07/2014 0545   K 4.4 02/07/2014 0545   CL 103 02/07/2014 0545   CO2 22 02/07/2014 0545   GLUCOSE 172* 02/07/2014 0545   BUN 14 02/07/2014 0545   CREATININE 1.27* 02/07/2014 0545   CALCIUM 9.2 02/07/2014 0545   PROT 7.1 03/26/2013 0236   ALBUMIN 3.2* 03/30/2013 0900   AST 37 03/26/2013 0236   ALT 48* 03/26/2013 0236   ALKPHOS 69 03/26/2013 0236   BILITOT 0.6 03/26/2013 0236   GFRNONAA 41* 02/07/2014 0545   GFRAA 47* 02/07/2014 0545    Assessment and Plan  Diverticulitis of intestine without perforation or abscess without bleeding R sided with mild pain;continue cipro 500 mg BID and flagyl 500 mg q 8 hrs for 2 weeks   Fall Pt is supposed to walk with cane and walker but didn't and fell;xrays did not show hip or other fracture   Paroxysmal atrial fibrillation On coreg  for rate; ASA for prophylaxis, not candidate for more   Chronic combined systolic and diastolic CHF (  congestive heart failure) Compensated;not on lasix in hosp due to mild dehydration but lasix resumed on d/c;pt on lopressor   ICD (implantable cardioverter-defibrillator) in place Noted   Dementia Reported mild     Margit Hanks, MD

## 2014-02-13 ENCOUNTER — Encounter: Payer: Self-pay | Admitting: Internal Medicine

## 2014-02-13 NOTE — Assessment & Plan Note (Signed)
Pt is supposed to walk with cane and walker but didn't and fell;xrays did not show hip or other fracture

## 2014-02-13 NOTE — Assessment & Plan Note (Signed)
Compensated;not on lasix in hosp due to mild dehydration but lasix resumed on d/c;pt on lopressor

## 2014-02-13 NOTE — Assessment & Plan Note (Signed)
On coreg for rate; ASA for prophylaxis, not candidate for more

## 2014-02-13 NOTE — Assessment & Plan Note (Signed)
Noted  

## 2014-02-13 NOTE — Assessment & Plan Note (Signed)
R sided with mild pain;continue cipro 500 mg BID and flagyl 500 mg q 8 hrs for 2 weeks

## 2014-02-13 NOTE — Assessment & Plan Note (Signed)
Reported mild

## 2014-02-23 ENCOUNTER — Non-Acute Institutional Stay (SKILLED_NURSING_FACILITY): Payer: Medicare Other | Admitting: Internal Medicine

## 2014-02-23 ENCOUNTER — Encounter: Payer: Self-pay | Admitting: Internal Medicine

## 2014-02-23 DIAGNOSIS — E785 Hyperlipidemia, unspecified: Secondary | ICD-10-CM | POA: Diagnosis not present

## 2014-02-23 DIAGNOSIS — I10 Essential (primary) hypertension: Secondary | ICD-10-CM

## 2014-02-23 DIAGNOSIS — F0390 Unspecified dementia without behavioral disturbance: Secondary | ICD-10-CM

## 2014-02-23 DIAGNOSIS — I5042 Chronic combined systolic (congestive) and diastolic (congestive) heart failure: Secondary | ICD-10-CM | POA: Diagnosis not present

## 2014-02-23 DIAGNOSIS — I480 Paroxysmal atrial fibrillation: Secondary | ICD-10-CM

## 2014-02-23 DIAGNOSIS — N189 Chronic kidney disease, unspecified: Secondary | ICD-10-CM | POA: Diagnosis not present

## 2014-02-23 DIAGNOSIS — E1122 Type 2 diabetes mellitus with diabetic chronic kidney disease: Secondary | ICD-10-CM | POA: Diagnosis not present

## 2014-02-23 DIAGNOSIS — K5792 Diverticulitis of intestine, part unspecified, without perforation or abscess without bleeding: Secondary | ICD-10-CM | POA: Diagnosis not present

## 2014-02-23 DIAGNOSIS — Z95810 Presence of automatic (implantable) cardiac defibrillator: Secondary | ICD-10-CM

## 2014-02-23 NOTE — Progress Notes (Signed)
MRN: [id number] Name: Paula [name]  Sex: female Age: 74 y.o. DOB: February 16, 1940  [facility] #: [facility] Facility/Room: 103 Level Of Care: SNF Provider: Merrilee Seashore D Emergency Contacts: Extended Emergency Contact Information Primary Emergency Contact: Pacheco,Alisia Address: [address]          [place], [place] [address] [place] Mobile Phone: [phone] Relation: Daughter Secondary Emergency Contact: Marsh,Yvonne Address: [address]          [place], [place] [address] [place] Mobile Phone: [phone] Relation: Daughter  Code Status:DNR   Allergies: Review of patient's allergies indicates no known allergies.  Chief Complaint  Patient presents with  . Discharge Note    HPI: Patient is 74 y.o. female who was admitted to SNF for generalized weakness for OT/PT after being hospitalized for R sided diverticulitis who is now ready to go home.  Past Medical History  Diagnosis Date  . Systolic CHF     non-ischemic, last cath 09/21/08 - normal left main, LAD, LCx, ramus intermedius, RCA  . Hypertension   . Stroke   . Headache(784.0)   . Diabetes mellitus   . Dementia 05/27/2012  . Osteopenia     DEXA 2012 : T score hip -2.3, femur -2.1  . CHF (congestive heart failure)     nonischemic cardiopathy, EF 25% by 2-D echo February 2015  . Shortness of breath   . 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 Royann Shivers  . Myocardial infarction 1991    [name] 12/21/2001  . 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 Automatic implantable cardioverter-defibrillator in situ   . Hyperlipidemia     Past Surgical History  Procedure Laterality Date  . Back surgery    . Abdominal hysterectomy    . Cardiac catheterization      [name] 12/22/2001  . Cardiac defibrillator placement  12/15/2010    dual-chamber - [employer] Proteca (Dr. Judie Petit. Croitoru)  . Transthoracic  echocardiogram  05/2010    EF 20-25%, mod conc hypertrophy, grade 3 diastolic dysfunction, elevated LVEDP; dilated MV annulus with mod regurg; LA severely dilated; RV mildly dilated; RA mod-severely dilated; mild-mod TR; PA peak pressure ; mod pulm hypertension  . Transthoracic echocardiogram  02/10/2013    EF 25%, mild AV regurg; LA mod dialted; RA mildly dilated; mod TR; PA peak  . Nm myocar perf wall motion  08/2008    lexiscan - diaphragmatic attenuation of inferior wall, EF 37%  . Cardiac catheterization  12/21/2001    normal LAD, Cfx w/prox luminal irregularities, RCA dominant with diffuse luminal irregularities (Dr. Daiva Nakayama)  . Cardiac catheterization  09/21/2008    normal left main/LAD/Cfx/RCA, ramus intermedius is large and normal (Dr. Erlene Quan)  . Implantable cardioverter defibrillator implant N/A 12/15/2010    Procedure: IMPLANTABLE CARDIOVERTER DEFIBRILLATOR IMPLANT;  Surgeon: Thurmon Fair, MD;  Location: [facility];  Service: Cardiovascular;  Laterality: N/A;      Medication List       This list is accurate as of: 02/23/14  3:42 PM.  Always use your most recent med list.               acetaminophen 325 MG tablet  Commonly known as:  TYLENOL  Take 2 tablets (650 mg total) by mouth 3 (three) times daily.     alendronate 70 MG tablet  Commonly known as:  FOSAMAX  TAKE 1 TABLET BY  MOUTH EVERY WEEK AS DIRECTED     ALPRAZolam 0.25 MG tablet  Commonly known as:  XANAX  Take 1 tablet (0.25 mg total) by mouth at bedtime.     amLODipine 10 MG tablet  Commonly known as:  NORVASC  Take 10 mg by mouth at bedtime.     aspirin 81 MG chewable tablet  Chew 1 tablet (81 mg total) by mouth daily.     carvedilol 6.25 MG tablet  Commonly known as:  COREG  TAKE 1 TABLET TWICE A DAY     ciprofloxacin 500 MG tablet  Commonly known as:  CIPRO  Take 1 tablet (500 mg total) by mouth 2 (two) times daily.     furosemide 40 MG tablet  Commonly known as:  LASIX  Take  1 tablet (40 mg total) by mouth 2 (two) times daily.     insulin aspart 100 UNIT/ML injection  Commonly known as:  novoLOG  Inject 0-5 Units into the skin at bedtime.     insulin aspart 100 UNIT/ML injection  Commonly known as:  novoLOG  Inject 0-9 Units into the skin 3 (three) times daily with meals.     insulin detemir 100 UNIT/ML injection  Commonly known as:  LEVEMIR  Inject 0.3 mLs (30 Units total) into the skin at bedtime.     isosorbide mononitrate 60 MG 24 hr tablet  Commonly known as:  IMDUR  Take 1 tablet (60 mg total) by mouth daily.     Liraglutide 18 MG/3ML Sopn  Inject 1.2 mg into the skin daily. Victoza     loratadine 10 MG tablet  Commonly known as:  CLARITIN  Take 1 tablet (10 mg total) by mouth daily as needed for allergies.     metroNIDAZOLE 500 MG tablet  Commonly known as:  FLAGYL  Take 1 tablet (500 mg total) by mouth every 8 (eight) hours.     nitroGLYCERIN 0.4 MG SL tablet  Commonly known as:  NITROSTAT  Place 1 tablet (0.4 mg total) under the tongue every 5 (five) minutes as needed for chest pain.     oxyCODONE 5 MG immediate release tablet  Commonly known as:  Oxy IR/ROXICODONE  Take 1 tablet (5 mg total) by mouth every 6 (six) hours as needed for moderate pain or severe pain.     polyethylene glycol packet  Commonly known as:  MIRALAX / GLYCOLAX  Take 17 g by mouth daily as needed for mild constipation.     traMADol 50 MG tablet  Commonly known as:  ULTRAM  Take 50 mg by mouth every 6 (six) hours as needed (pain).        No orders of the defined types were placed in this encounter.    Immunization History  Administered Date(s) Administered  . Influenza-Unspecified 09/01/2012  . Pneumococcal Polysaccharide-23 07/13/2012    History  Substance Use Topics  . Smoking status: Current Every Day Smoker    Types: Cigarettes  . Smokeless tobacco: Never Used  . Alcohol Use: No    Filed Vitals:   02/23/14 1521  BP: 117/67  Pulse: 84   Temp: 98.2 F (36.8 C)  Resp: 18    Physical Exam  GENERAL APPEARANCE: Alert, conversant. No acute distress.  HEENT: Unremarkable. RESPIRATORY: Breathing is even, unlabored. Lung sounds are clear   CARDIOVASCULAR: Heart RRR no murmurs, rubs or gallops. No peripheral edema.  GASTROINTESTINAL: Abdomen is soft, non-tender, not distended w/ normal bowel sounds.  NEUROLOGIC: Cranial nerves 2-12 grossly  intact. Moves all extremities  Patient Active Problem List   Diagnosis Date Noted  . DM (diabetes mellitus), type 2 with renal complications 02/07/2014  . Diverticulitis 02/06/2014  . Chronic combined systolic and diastolic CHF (congestive heart failure) 02/06/2014  . Diverticulitis of intestine without perforation or abscess without bleeding   . Fall   . ICD (implantable cardioverter-defibrillator) in place 08/23/2013  . Paroxysmal atrial fibrillation 08/23/2013  . Hyperlipidemia 07/01/2013  . Type II or unspecified type diabetes mellitus with neurological manifestations, not stated as uncontrolled 04/09/2013  . Osteoporosis 04/09/2013  . Allergic rhinitis 04/09/2013  . Acute respiratory failure 03/25/2013  . Acute ischemic stroke 02/09/2013  . Systolic and diastolic CHF, acute on chronic 02/09/2013  . Acute encephalopathy 02/09/2013  . Hyperglycemia 12/24/2012  . Malnutrition of moderate degree 07/12/2012  . Hyperkalemia, diminished renal excretion 05/27/2012  . AKI (acute kidney injury) 05/27/2012  . HTN (hypertension) 05/27/2012  . Dementia 05/27/2012  . Adult failure to thrive 05/27/2012    CBC    Component Value Date/Time   WBC 14.6* 02/07/2014 0545   RBC 4.19 02/07/2014 0545   HGB 11.8* 02/07/2014 0545   HCT 36.2 02/07/2014 0545   PLT 277 02/07/2014 0545   MCV 86.4 02/07/2014 0545   LYMPHSABS 1.6 02/06/2014 0456   MONOABS 1.5* 02/06/2014 0456   EOSABS 0.0 02/06/2014 0456   BASOSABS 0.0 02/06/2014 0456    CMP     Component Value Date/Time   NA 132*  02/07/2014 0545   K 4.4 02/07/2014 0545   CL 103 02/07/2014 0545   CO2 22 02/07/2014 0545   GLUCOSE 172* 02/07/2014 0545   BUN 14 02/07/2014 0545   CREATININE 1.27* 02/07/2014 0545   CALCIUM 9.2 02/07/2014 0545   PROT 7.1 03/26/2013 0236   ALBUMIN 3.2* 03/30/2013 0900   AST 37 03/26/2013 0236   ALT 48* 03/26/2013 0236   ALKPHOS 69 03/26/2013 0236   BILITOT 0.6 03/26/2013 0236   GFRNONAA 41* 02/07/2014 0545   GFRAA 47* 02/07/2014 0545    Assessment and Plan  Pt is stable to be d/c to home with HH/OT/PT/nursing.  Margit Hanks, MD

## 2014-03-10 ENCOUNTER — Telehealth: Payer: Self-pay | Admitting: Cardiology

## 2014-03-10 NOTE — Telephone Encounter (Signed)
Spoke w/ pt daughter and she informed me that they sent wirex back to [employer] b/c they were told that it would not be paid for. Pt has not been interrogated since August of 2015. Pt daughter agreed to appt on 3-17 at 11:30.

## 2014-03-18 ENCOUNTER — Ambulatory Visit (INDEPENDENT_AMBULATORY_CARE_PROVIDER_SITE_OTHER): Payer: Medicare Other | Admitting: *Deleted

## 2014-03-18 DIAGNOSIS — I5042 Chronic combined systolic (congestive) and diastolic (congestive) heart failure: Secondary | ICD-10-CM | POA: Diagnosis not present

## 2014-03-18 DIAGNOSIS — Z95810 Presence of automatic (implantable) cardiac defibrillator: Secondary | ICD-10-CM

## 2014-03-18 LAB — MDC_IDC_ENUM_SESS_TYPE_INCLINIC
Battery Voltage: 3.11 V
Brady Statistic AP VP Percent: 0.01 %
Brady Statistic AP VS Percent: 14.28 %
Brady Statistic RA Percent Paced: 14.29 %
Brady Statistic RV Percent Paced: 0.05 %
HighPow Impedance: 228 Ohm
HighPow Impedance: 456 Ohm
HighPow Impedance: 51 Ohm
HighPow Impedance: 66 Ohm
Lead Channel Impedance Value: 513 Ohm
Lead Channel Pacing Threshold Amplitude: 1.125 V
Lead Channel Pacing Threshold Pulse Width: 0.4 ms
Lead Channel Pacing Threshold Pulse Width: 0.4 ms
Lead Channel Sensing Intrinsic Amplitude: 2.125 mV
Lead Channel Setting Pacing Amplitude: 2.5 V
Lead Channel Setting Sensing Sensitivity: 0.3 mV
MDC IDC MSMT LEADCHNL RA SENSING INTR AMPL: 2.375 mV
MDC IDC MSMT LEADCHNL RV IMPEDANCE VALUE: 551 Ohm
MDC IDC MSMT LEADCHNL RV PACING THRESHOLD AMPLITUDE: 0.75 V
MDC IDC MSMT LEADCHNL RV SENSING INTR AMPL: 31.625 mV
MDC IDC MSMT LEADCHNL RV SENSING INTR AMPL: 31.625 mV
MDC IDC SESS DTM: 20160317111418
MDC IDC SET LEADCHNL RA PACING AMPLITUDE: 2 V
MDC IDC SET LEADCHNL RV PACING PULSEWIDTH: 0.4 ms
MDC IDC SET ZONE DETECTION INTERVAL: 300 ms
MDC IDC SET ZONE DETECTION INTERVAL: 360 ms
MDC IDC SET ZONE DETECTION INTERVAL: 370 ms
MDC IDC STAT BRADY AS VP PERCENT: 0.03 %
MDC IDC STAT BRADY AS VS PERCENT: 85.67 %
Zone Setting Detection Interval: 350 ms

## 2014-03-18 NOTE — Progress Notes (Signed)
*  Pt did not have med list today.*  ICD check in clinic. Normal device function. Thresholds and sensing consistent with previous device measurements. Impedance trends stable over time. No evidence of any ventricular arrhythmias. No mode switches. Histogram distribution appropriate for patient and level of activity. No changes made this session. Device programmed at appropriate safety margins---changed RA output from 2.25 to 2.0V. Device programmed to optimize intrinsic conduction. Remaining battery 3.11V. Pt enrolled in remote follow-up---has WireX. Plan to check device every 3 months remotely and in office annually. Patient education completed including shock plan. Alert vibration demonstrated for patient, pt knows to call clinic if heard. [facility] 06/17/14 & ROV w/ Dr. Royann Shivers in 69mo.

## 2014-03-23 ENCOUNTER — Telehealth: Payer: Self-pay | Admitting: Cardiovascular Disease

## 2014-03-23 NOTE — Telephone Encounter (Signed)
Spoke with daughter Myrene Buddy, She states her physical therapist reported a weight gain of 10 lbs and nurse's aide called her. Daughter states she lives in [place] and patient states in [place] asked a daughter for more information,she states she will have to call back or have sister to call once she got home today Myrene Buddy states she does not able give mother's phone number- the number's listed are both the daughter's number.  RN last visit in office 01/08/14 with pharmacist - weight 195 lbs,and device check on 03/18/14 at [address] office.  RN  Informed her to contact office back - recommend if mother has any increase symtpoms  -ie chest pain, difficulty with shortness of breath, pitting swelling to go to ER. She verbalized understanding.

## 2014-03-23 NOTE — Telephone Encounter (Signed)
Pt's daughter called in stating that the pt has gained 10lbs in 1 day and she is concerned. Please f/u with her  Thanks

## 2014-03-31 ENCOUNTER — Encounter: Payer: Self-pay | Admitting: Cardiovascular Disease

## 2014-04-02 ENCOUNTER — Other Ambulatory Visit: Payer: Self-pay | Admitting: Nurse Practitioner

## 2014-04-05 ENCOUNTER — Other Ambulatory Visit: Payer: Self-pay | Admitting: Adult Health

## 2014-04-09 ENCOUNTER — Other Ambulatory Visit: Payer: Self-pay | Admitting: Nurse Practitioner

## 2014-04-12 ENCOUNTER — Other Ambulatory Visit: Payer: Self-pay | Admitting: Adult Health

## 2014-04-18 ENCOUNTER — Other Ambulatory Visit: Payer: Self-pay | Admitting: Adult Health

## 2014-05-03 ENCOUNTER — Other Ambulatory Visit: Payer: Self-pay | Admitting: Adult Health

## 2014-05-06 ENCOUNTER — Other Ambulatory Visit: Payer: Self-pay | Admitting: Adult Health

## 2014-05-14 ENCOUNTER — Telehealth: Payer: Self-pay | Admitting: Cardiology

## 2014-05-14 NOTE — Telephone Encounter (Signed)
Pt will call back to talk about ICM clinic.

## 2014-05-21 ENCOUNTER — Telehealth: Payer: Self-pay | Admitting: Cardiology

## 2014-05-21 NOTE — Telephone Encounter (Signed)
Spoke w/ pt daughter about ICM clinic. She agreed to pt being enrolled in [facility] clinic. 1st transmission 06-17-14

## 2014-06-17 ENCOUNTER — Telehealth: Payer: Self-pay | Admitting: Cardiology

## 2014-06-17 ENCOUNTER — Encounter: Payer: Self-pay | Admitting: Cardiovascular Disease

## 2014-06-17 ENCOUNTER — Ambulatory Visit (INDEPENDENT_AMBULATORY_CARE_PROVIDER_SITE_OTHER): Payer: Medicare Other | Admitting: *Deleted

## 2014-06-17 DIAGNOSIS — Z95810 Presence of automatic (implantable) cardiac defibrillator: Secondary | ICD-10-CM

## 2014-06-17 DIAGNOSIS — I5042 Chronic combined systolic (congestive) and diastolic (congestive) heart failure: Secondary | ICD-10-CM | POA: Diagnosis not present

## 2014-06-17 NOTE — Progress Notes (Signed)
Remote ICD transmission.   

## 2014-06-17 NOTE — Telephone Encounter (Signed)
Confirmed remote transmission w/ pt daughter.   

## 2014-06-18 ENCOUNTER — Telehealth: Payer: Self-pay | Admitting: Cardiovascular Disease

## 2014-06-18 NOTE — Addendum Note (Signed)
Addended bySherri Rad C on: 06/18/2014 11:08 AM   Modules accepted: Level of Service

## 2014-06-18 NOTE — Telephone Encounter (Signed)
First ICM transmission done with this patient today- See 6/16 "clinical support" note Donalynn Furlong). The patient's optivol readings are elevated. I have increased lasix x 3 days. Per the patient's daughter, she has historically had a symbicort inhaler, but is out of this due to her PCP retiring. She is trying to establish the patient with a PCP, but inquired if a refill could be sent to [facility] on [address]. I advised her I would need to have approval from Dr. Allyson Sabal Dr. Royann Shivers prior to sending this in . I will forward the message to the [facility] office and ask that they call the patient's daughter back and let her know if the refill can be sent or not. She is agreeable.

## 2014-06-18 NOTE — Progress Notes (Addendum)
EPIC Encounter for ICM Monitoring  Patient Name: Paula [name] is a 74 y.o. female Date: 06/18/2014 Primary Care Physican: No PCP currently (Dr. Margretta Ditty has retired) Primary Cardiologist: Allyson Sabal Electrophysiologist: Croitoru Dry Weight: 188 lbs       In the past month, have you:  1. Gained more than 2 pounds in a day or more than 5 pounds in a week? Per the patient's daughter, she has gained about 2 lbs since last week.  2. Had changes in your medications (with verification of current medications)? no  3. Had more shortness of breath than is usual for you? Yes. She had trouble walking from the chair to the kitchen last night without getting SOB. The patient has been on a Symbicort inhaler, but is out, so her daughter let her use hers to see if she would find relief from this. She was unable to lie flat last night sleep. She is in the recliner and was given another dose of inhaler this morning.   4. Limited your activity because of shortness of breath? no  5. Not been able to sleep because of shortness of breath? no  6. Had increased swelling in your feet or ankles? She is having some swelling to her lower extremities over the last few weeks per her daughter's report. She has seen some swelling to her knees as well.   7. Had symptoms of dehydration (dizziness, dry mouth, increased thirst, decreased urine output) no  8. Had changes in sodium restriction? no  9. Been compliant with medication? Yes   ICM trend:   Follow-up plan: ICM clinic phone appointment: 06/25/14. This is my first ICM encounter with the patient. She lives with her daughter, [name], and this is who I spoke with today. The patient's optivol readings have been steadily increasing since ~ 4/28- present. She is having some swelling and recently developed a cough- the patient's daughter states you can hear a rattle in her chest. She has been on an inhaler previously, but is out due to her PCP retiring. She has used her  daughter's inhaler, with what sounds like little improvement over the last 12 (+) hours. She is currently on lasix 40 mg BID. I have advised the patient's daughter to increase lasix to 80 mg BID x 3 days, then resume normal dosing. Continue daily weights and repeat transmission in 1 week. The patient's daughter reports she sees a PCP at a [place] location and is due to for follow up with them today. She will inquire with that office if they will accept her mother as a new patient. They have requested if we can refill her inhaler in the interim. I advised I would forward to Dr. Allyson Sabal Dr. Royann Shivers for approval.   Copy of note sent to patient's primary care physician, primary cardiologist, and device following physician.  Sherri Rad, RN, BSN 06/18/2014 10:56 AM  Agree with increase in diuretics. Thanks     ----- Message -----     From: Jefferey Pica, RN     Sent: 06/18/2014 11:07 AM      To: Thurmon Fair, MD

## 2014-06-18 NOTE — Telephone Encounter (Signed)
Dr Berry, please advise 

## 2014-06-20 ENCOUNTER — Other Ambulatory Visit: Payer: Self-pay | Admitting: Adult Health

## 2014-06-21 ENCOUNTER — Other Ambulatory Visit: Payer: Self-pay | Admitting: Cardiovascular Disease

## 2014-06-21 NOTE — Telephone Encounter (Signed)
Rx(s) sent to pharmacy electronically.  

## 2014-06-22 ENCOUNTER — Emergency Department (HOSPITAL_COMMUNITY): Payer: Medicare Other

## 2014-06-22 ENCOUNTER — Encounter (HOSPITAL_COMMUNITY): Payer: Self-pay | Admitting: *Deleted

## 2014-06-22 ENCOUNTER — Inpatient Hospital Stay (HOSPITAL_COMMUNITY): Payer: Medicare Other

## 2014-06-22 ENCOUNTER — Inpatient Hospital Stay (HOSPITAL_COMMUNITY)
Admission: EM | Admit: 2014-06-22 | Discharge: 2014-06-28 | DRG: 291 | Disposition: A | Discharge status: Home / Self Care | Payer: Medicare Other | Attending: Internal Medicine | Admitting: Internal Medicine

## 2014-06-22 DIAGNOSIS — R079 Chest pain, unspecified: Secondary | ICD-10-CM

## 2014-06-22 DIAGNOSIS — R55 Syncope and collapse: Secondary | ICD-10-CM

## 2014-06-22 DIAGNOSIS — I10 Essential (primary) hypertension: Secondary | ICD-10-CM | POA: Diagnosis present

## 2014-06-22 DIAGNOSIS — E1129 Type 2 diabetes mellitus with other diabetic kidney complication: Secondary | ICD-10-CM | POA: Diagnosis present

## 2014-06-22 DIAGNOSIS — J9601 Acute respiratory failure with hypoxia: Secondary | ICD-10-CM | POA: Diagnosis present

## 2014-06-22 DIAGNOSIS — E785 Hyperlipidemia, unspecified: Secondary | ICD-10-CM | POA: Diagnosis present

## 2014-06-22 DIAGNOSIS — F17210 Nicotine dependence, cigarettes, uncomplicated: Secondary | ICD-10-CM | POA: Diagnosis present

## 2014-06-22 DIAGNOSIS — R61 Generalized hyperhidrosis: Secondary | ICD-10-CM | POA: Diagnosis present

## 2014-06-22 DIAGNOSIS — Z79899 Other long term (current) drug therapy: Secondary | ICD-10-CM

## 2014-06-22 DIAGNOSIS — E1122 Type 2 diabetes mellitus with diabetic chronic kidney disease: Secondary | ICD-10-CM | POA: Diagnosis not present

## 2014-06-22 DIAGNOSIS — Z7982 Long term (current) use of aspirin: Secondary | ICD-10-CM | POA: Diagnosis not present

## 2014-06-22 DIAGNOSIS — I5043 Acute on chronic combined systolic (congestive) and diastolic (congestive) heart failure: Secondary | ICD-10-CM | POA: Diagnosis present

## 2014-06-22 DIAGNOSIS — Z66 Do not resuscitate: Secondary | ICD-10-CM | POA: Diagnosis present

## 2014-06-22 DIAGNOSIS — I480 Paroxysmal atrial fibrillation: Secondary | ICD-10-CM | POA: Diagnosis present

## 2014-06-22 DIAGNOSIS — I429 Cardiomyopathy, unspecified: Secondary | ICD-10-CM | POA: Diagnosis present

## 2014-06-22 DIAGNOSIS — Z8673 Personal history of transient ischemic attack (TIA), and cerebral infarction without residual deficits: Secondary | ICD-10-CM

## 2014-06-22 DIAGNOSIS — N179 Acute kidney failure, unspecified: Secondary | ICD-10-CM | POA: Diagnosis present

## 2014-06-22 DIAGNOSIS — I252 Old myocardial infarction: Secondary | ICD-10-CM | POA: Diagnosis not present

## 2014-06-22 DIAGNOSIS — Z823 Family history of stroke: Secondary | ICD-10-CM

## 2014-06-22 DIAGNOSIS — R0902 Hypoxemia: Secondary | ICD-10-CM | POA: Diagnosis present

## 2014-06-22 DIAGNOSIS — Z791 Long term (current) use of non-steroidal anti-inflammatories (NSAID): Secondary | ICD-10-CM

## 2014-06-22 DIAGNOSIS — Z95810 Presence of automatic (implantable) cardiac defibrillator: Secondary | ICD-10-CM

## 2014-06-22 DIAGNOSIS — Z794 Long term (current) use of insulin: Secondary | ICD-10-CM

## 2014-06-22 DIAGNOSIS — E876 Hypokalemia: Secondary | ICD-10-CM | POA: Diagnosis present

## 2014-06-22 DIAGNOSIS — N189 Chronic kidney disease, unspecified: Secondary | ICD-10-CM | POA: Diagnosis not present

## 2014-06-22 DIAGNOSIS — R410 Disorientation, unspecified: Secondary | ICD-10-CM

## 2014-06-22 DIAGNOSIS — F0390 Unspecified dementia without behavioral disturbance: Secondary | ICD-10-CM | POA: Diagnosis present

## 2014-06-22 HISTORY — DX: Other cardiomyopathies: I42.8

## 2014-06-22 HISTORY — DX: Reserved for inherently not codable concepts without codable children: IMO0001

## 2014-06-22 HISTORY — DX: Chronic systolic (congestive) heart failure: I50.22

## 2014-06-22 LAB — URINALYSIS, ROUTINE W REFLEX MICROSCOPIC
BILIRUBIN URINE: NEGATIVE
Glucose, UA: NEGATIVE mg/dL
Hgb urine dipstick: NEGATIVE
KETONES UR: NEGATIVE mg/dL
NITRITE: NEGATIVE
PH: 7 (ref 5.0–8.0)
PROTEIN: 100 mg/dL — AB
Specific Gravity, Urine: 1.007 (ref 1.005–1.030)
Urobilinogen, UA: 1 mg/dL (ref 0.0–1.0)

## 2014-06-22 LAB — CBC
HCT: 34.9 % — ABNORMAL LOW (ref 36.0–46.0)
Hemoglobin: 11.2 g/dL — ABNORMAL LOW (ref 12.0–15.0)
MCH: 28.5 pg (ref 26.0–34.0)
MCHC: 32.1 g/dL (ref 30.0–36.0)
MCV: 88.8 fL (ref 78.0–100.0)
PLATELETS: 253 10*3/uL (ref 150–400)
RBC: 3.93 MIL/uL (ref 3.87–5.11)
RDW: 16.7 % — AB (ref 11.5–15.5)
WBC: 5.8 10*3/uL (ref 4.0–10.5)

## 2014-06-22 LAB — URINE MICROSCOPIC-ADD ON

## 2014-06-22 LAB — I-STAT TROPONIN, ED: TROPONIN I, POC: 0.07 ng/mL (ref 0.00–0.08)

## 2014-06-22 LAB — TSH: TSH: 1.857 u[IU]/mL (ref 0.350–4.500)

## 2014-06-22 LAB — GLUCOSE, CAPILLARY
GLUCOSE-CAPILLARY: 52 mg/dL — AB (ref 65–99)
GLUCOSE-CAPILLARY: 93 mg/dL (ref 65–99)
GLUCOSE-CAPILLARY: 121 mg/dL — AB (ref 65–99)
Glucose-Capillary: 76 mg/dL (ref 65–99)

## 2014-06-22 LAB — COMPREHENSIVE METABOLIC PANEL
ALBUMIN: 3.6 g/dL (ref 3.5–5.0)
ALT: 27 U/L (ref 14–54)
AST: 22 U/L (ref 15–41)
Alkaline Phosphatase: 80 U/L (ref 38–126)
Anion gap: 10 (ref 5–15)
BUN: 14 mg/dL (ref 6–20)
CO2: 23 mmol/L (ref 22–32)
Calcium: 8.8 mg/dL — ABNORMAL LOW (ref 8.9–10.3)
Chloride: 104 mmol/L (ref 101–111)
Creatinine, Ser: 1.19 mg/dL — ABNORMAL HIGH (ref 0.44–1.00)
GFR calc non Af Amer: 44 mL/min — ABNORMAL LOW (ref >60)
GFR, EST AFRICAN AMERICAN: 51 mL/min — AB (ref >60)
GLUCOSE: 65 mg/dL (ref 65–99)
Potassium: 2.8 mmol/L — ABNORMAL LOW (ref 3.5–5.1)
Sodium: 137 mmol/L (ref 135–145)
TOTAL PROTEIN: 6.2 g/dL — AB (ref 6.5–8.1)
Total Bilirubin: 1.3 mg/dL — ABNORMAL HIGH (ref 0.3–1.2)

## 2014-06-22 LAB — MAGNESIUM: Magnesium: 1.9 mg/dL (ref 1.7–2.4)

## 2014-06-22 LAB — BRAIN NATRIURETIC PEPTIDE: B Natriuretic Peptide: 1855.7 pg/mL — ABNORMAL HIGH (ref 0.0–100.0)

## 2014-06-22 LAB — TROPONIN I
TROPONIN I: 0.07 ng/mL — AB (ref <0.031)
Troponin I: 0.07 ng/mL — ABNORMAL HIGH (ref <0.031)

## 2014-06-22 MED ORDER — ALPRAZOLAM 0.25 MG PO TABS
0.2500 mg | ORAL_TABLET | Freq: Every evening | ORAL | Status: DC | PRN
  Administered 2014-06-22 – 2014-06-24 (×3): 0.25 mg via ORAL
  Filled 2014-06-22 (×3): qty 1

## 2014-06-22 MED ORDER — ONDANSETRON HCL 4 MG/2ML IJ SOLN: 4.0000 mg | Freq: Four times a day (QID) | INTRAMUSCULAR | Status: DC | PRN

## 2014-06-22 MED ORDER — ACETAMINOPHEN 325 MG PO TABS
650.0000 mg | ORAL_TABLET | Freq: Four times a day (QID) | ORAL | Status: DC | PRN
  Administered 2014-06-23: 650 mg via ORAL
  Filled 2014-06-22: qty 2

## 2014-06-22 MED ORDER — AMLODIPINE BESYLATE 10 MG PO TABS
10.0000 mg | ORAL_TABLET | Freq: Every day | ORAL | Status: DC
  Administered 2014-06-22 – 2014-06-27 (×6): 10 mg via ORAL
  Filled 2014-06-22 (×9): qty 1

## 2014-06-22 MED ORDER — POTASSIUM CHLORIDE CRYS ER 20 MEQ PO TBCR
40.0000 meq | EXTENDED_RELEASE_TABLET | Freq: Once | ORAL | Status: AC
  Administered 2014-06-22: 40 meq via ORAL
  Filled 2014-06-22: qty 2

## 2014-06-22 MED ORDER — SODIUM CHLORIDE 0.9 % IJ SOLN
3.0000 mL | Freq: Two times a day (BID) | INTRAMUSCULAR | Status: DC
  Administered 2014-06-23 – 2014-06-27 (×8): 3 mL via INTRAVENOUS

## 2014-06-22 MED ORDER — POLYETHYLENE GLYCOL 3350 17 G PO PACK: 17.0000 g | PACK | Freq: Every day | ORAL | Status: DC | PRN

## 2014-06-22 MED ORDER — SODIUM CHLORIDE 0.9 % IJ SOLN: 3.0000 mL | INTRAMUSCULAR | Status: DC | PRN

## 2014-06-22 MED ORDER — ACETAMINOPHEN 650 MG RE SUPP: 650.0000 mg | Freq: Four times a day (QID) | RECTAL | Status: DC | PRN

## 2014-06-22 MED ORDER — ONDANSETRON HCL 4 MG PO TABS: 4.0000 mg | ORAL_TABLET | Freq: Four times a day (QID) | ORAL | Status: DC | PRN

## 2014-06-22 MED ORDER — ISOSORBIDE MONONITRATE ER 60 MG PO TB24
60.0000 mg | ORAL_TABLET | Freq: Every day | ORAL | Status: DC
  Administered 2014-06-22 – 2014-06-28 (×7): 60 mg via ORAL
  Filled 2014-06-22 (×8): qty 1

## 2014-06-22 MED ORDER — POLYETHYLENE GLYCOL 3350 17 G PO PACK
17.0000 g | PACK | Freq: Every day | ORAL | Status: DC | PRN
Start: 2014-06-22 — End: 2014-06-28
  Filled 2014-06-22: qty 1

## 2014-06-22 MED ORDER — SODIUM CHLORIDE 0.9 % IV SOLN: 250.0000 mL | INTRAVENOUS | Status: DC | PRN

## 2014-06-22 MED ORDER — ASPIRIN 81 MG PO CHEW
81.0000 mg | CHEWABLE_TABLET | Freq: Every day | ORAL | Status: DC
  Administered 2014-06-22 – 2014-06-28 (×7): 81 mg via ORAL
  Filled 2014-06-22 (×7): qty 1

## 2014-06-22 MED ORDER — INSULIN ASPART 100 UNIT/ML ~~LOC~~ SOLN
0.0000 [IU] | Freq: Three times a day (TID) | SUBCUTANEOUS | Status: DC
  Administered 2014-06-23: 5 [IU] via SUBCUTANEOUS
  Administered 2014-06-24: 2 [IU] via SUBCUTANEOUS
  Administered 2014-06-25: 3 [IU] via SUBCUTANEOUS
  Administered 2014-06-28 (×2): 2 [IU] via SUBCUTANEOUS

## 2014-06-22 MED ORDER — BISACODYL 10 MG RE SUPP
10.0000 mg | Freq: Every day | RECTAL | Status: DC | PRN
  Filled 2014-06-22: qty 1

## 2014-06-22 MED ORDER — NITROGLYCERIN 0.4 MG SL SUBL
0.4000 mg | SUBLINGUAL_TABLET | SUBLINGUAL | Status: DC | PRN
Start: 2014-06-22 — End: 2014-06-28

## 2014-06-22 MED ORDER — HEPARIN SODIUM (PORCINE) 5000 UNIT/ML IJ SOLN
5000.0000 [IU] | Freq: Three times a day (TID) | INTRAMUSCULAR | Status: DC
  Administered 2014-06-22 – 2014-06-28 (×18): 5000 [IU] via SUBCUTANEOUS
  Filled 2014-06-22 (×19): qty 1

## 2014-06-22 MED ORDER — ASPIRIN 81 MG PO CHEW
324.0000 mg | CHEWABLE_TABLET | Freq: Once | ORAL | Status: AC
  Administered 2014-06-22: 324 mg via ORAL
  Filled 2014-06-22: qty 4

## 2014-06-22 MED ORDER — INSULIN ASPART 100 UNIT/ML ~~LOC~~ SOLN
3.0000 [IU] | Freq: Three times a day (TID) | SUBCUTANEOUS | Status: DC
  Administered 2014-06-24 – 2014-06-25 (×5): 3 [IU] via SUBCUTANEOUS

## 2014-06-22 MED ORDER — FUROSEMIDE 10 MG/ML IJ SOLN
40.0000 mg | Freq: Two times a day (BID) | INTRAMUSCULAR | Status: DC
  Administered 2014-06-22 – 2014-06-24 (×4): 40 mg via INTRAVENOUS
  Filled 2014-06-22 (×6): qty 4

## 2014-06-22 MED ORDER — DOCUSATE SODIUM 100 MG PO CAPS
100.0000 mg | ORAL_CAPSULE | Freq: Two times a day (BID) | ORAL | Status: DC
  Administered 2014-06-22 – 2014-06-28 (×13): 100 mg via ORAL
  Filled 2014-06-22 (×15): qty 1

## 2014-06-22 MED ORDER — ACETAMINOPHEN 325 MG PO TABS
650.0000 mg | ORAL_TABLET | Freq: Three times a day (TID) | ORAL | Status: DC
  Administered 2014-06-22 – 2014-06-28 (×17): 650 mg via ORAL
  Filled 2014-06-22 (×17): qty 2

## 2014-06-22 MED ORDER — INSULIN DETEMIR 100 UNIT/ML ~~LOC~~ SOLN
20.0000 [IU] | Freq: Every day | SUBCUTANEOUS | Status: DC
  Administered 2014-06-24: 20 [IU] via SUBCUTANEOUS
  Filled 2014-06-22 (×4): qty 0.2

## 2014-06-22 MED ORDER — SODIUM CHLORIDE 0.9 % IJ SOLN
3.0000 mL | Freq: Two times a day (BID) | INTRAMUSCULAR | Status: DC
  Administered 2014-06-22 – 2014-06-27 (×11): 3 mL via INTRAVENOUS

## 2014-06-22 MED ORDER — CARVEDILOL 6.25 MG PO TABS
6.2500 mg | ORAL_TABLET | Freq: Two times a day (BID) | ORAL | Status: DC
  Administered 2014-06-22 – 2014-06-24 (×5): 6.25 mg via ORAL
  Filled 2014-06-22 (×6): qty 1

## 2014-06-22 MED ORDER — LORATADINE 10 MG PO TABS
10.0000 mg | ORAL_TABLET | Freq: Every day | ORAL | Status: DC | PRN
  Administered 2014-06-24 – 2014-06-28 (×2): 10 mg via ORAL
  Filled 2014-06-22 (×4): qty 1

## 2014-06-22 MED ORDER — POTASSIUM CHLORIDE CRYS ER 20 MEQ PO TBCR
40.0000 meq | EXTENDED_RELEASE_TABLET | Freq: Two times a day (BID) | ORAL | Status: AC
  Administered 2014-06-22 – 2014-06-23 (×3): 40 meq via ORAL
  Filled 2014-06-22 (×3): qty 2

## 2014-06-22 NOTE — ED Provider Notes (Signed)
CSN: [id number]     Arrival date & time 06/22/14  0786 History   First MD Initiated Contact with Patient 06/22/14 0932     Chief Complaint  Patient presents with  . Chest Pain     (Consider location/radiation/quality/duration/timing/severity/associated sxs/prior Treatment) HPI Comments: Brought in by EMS. Patient and family are very poor historians. Patient has hx of dementia. She sdid tell me she had CP this morning, but couldn't elaborate any more than quality, which she described as, "felt like it was running away."   Patient is a 74 y.o. female presenting with chest pain. The history is provided by the patient. The history is limited by the condition of the patient.  Chest Pain Pain location:  Substernal area Pain quality comment:  "felt like it was running away" Pain radiates to:  Does not radiate Pain radiates to the back: no   Pain severity:  Moderate Onset quality:  Sudden Progression:  Resolved Chronicity:  Recurrent Context: at rest     Past Medical History  Diagnosis Date  . Systolic CHF     non-ischemic, last cath 09/21/08 - normal left main, LAD, LCx, ramus intermedius, RCA  . Hypertension   . Stroke   . Headache(784.0)   . Diabetes mellitus   . Dementia 05/27/2012  . Osteopenia     DEXA 2012 : T score hip -2.3, femur -2.1  . CHF (congestive heart failure)     nonischemic cardiopathy, EF 25% by 2-D echo February 2015  . Shortness of breath   . 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 Royann Shivers  . Myocardial infarction 1991    [name] 12/21/2001  . 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 Automatic implantable cardioverter-defibrillator in situ   . Hyperlipidemia    Past Surgical History  Procedure Laterality Date  . Back surgery    . Abdominal hysterectomy    . Cardiac catheterization      [name] 12/22/2001  . Cardiac defibrillator placement  12/15/2010     dual-chamber - Medtronic Proteca (Dr. Judie Petit. Croitoru)  . Transthoracic echocardiogram  05/2010    EF 20-25%, mod conc hypertrophy, grade 3 diastolic dysfunction, elevated LVEDP; dilated MV annulus with mod regurg; LA severely dilated; RV mildly dilated; RA mod-severely dilated; mild-mod TR; PA peak pressure ; mod pulm hypertension  . Transthoracic echocardiogram  02/10/2013    EF 25%, mild AV regurg; LA mod dialted; RA mildly dilated; mod TR; PA peak  . Nm myocar perf wall motion  08/2008    lexiscan - diaphragmatic attenuation of inferior wall, EF 37%  . Cardiac catheterization  12/21/2001    normal LAD, Cfx w/prox luminal irregularities, RCA dominant with diffuse luminal irregularities (Dr. Daiva Nakayama)  . Cardiac catheterization  09/21/2008    normal left main/LAD/Cfx/RCA, ramus intermedius is large and normal (Dr. Erlene Quan)  . Implantable cardioverter defibrillator implant N/A 12/15/2010    Procedure: IMPLANTABLE CARDIOVERTER DEFIBRILLATOR IMPLANT;  Surgeon: Thurmon Fair, MD;  Location: [facility] CATH [facility];  Service: Cardiovascular;  Laterality: N/A;   History reviewed. No pertinent family history. History  Substance Use Topics  . Smoking status: Current Every Day Smoker    Types: Cigarettes  . Smokeless tobacco: Never Used  . Alcohol Use: No   OB History    No data available     Review of Systems  Unable to perform ROS: Dementia  Cardiovascular: Positive for chest pain.  All  other systems reviewed and are negative.     Allergies  Review of patient's allergies indicates no known allergies.  Home Medications   Prior to Admission medications   Medication Sig Start Date End Date Taking? Authorizing Provider  acetaminophen (TYLENOL) 325 MG tablet Take 2 tablets (650 mg total) by mouth 3 (three) times daily. 02/08/14   Joseph Art, DO  alendronate (FOSAMAX) 70 MG tablet TAKE 1 TABLET BY MOUTH EVERY WEEK AS DIRECTED 05/12/13   Oneal Grout, MD  ALPRAZolam Prudy Feeler) 0.25 MG  tablet Take 1 tablet (0.25 mg total) by mouth at bedtime. 02/08/14   Joseph Art, DO  amLODipine (NORVASC) 10 MG tablet Take 10 mg by mouth at bedtime.     Historical Provider, MD  aspirin 81 MG chewable tablet Chew 1 tablet (81 mg total) by mouth daily. 02/08/14   Joseph Art, DO  carvedilol (COREG) 6.25 MG tablet TAKE 1 TABLET TWICE A DAY Patient taking differently: Take 6.25 mg by mouth 2 (two) times daily with a meal.  07/01/13   Runell Gess, MD  furosemide (LASIX) 40 MG tablet Take 1 tablet (40 mg total) by mouth 2 (two) times daily. 02/14/13   Rodolph Bong, MD  insulin aspart (NOVOLOG) 100 UNIT/ML injection Inject 0-9 Units into the skin 3 (three) times daily with meals. Patient taking differently: Inject 0-9 Units into the skin 3 (three) times daily with meals. SSI  151-200 2u; 201-250  4u; 251- 300  6u; 301-350 8u; 351-400  8u; > 400  Call MD 02/08/14   Joseph Art, DO  insulin detemir (LEVEMIR) 100 UNIT/ML injection Inject 0.3 mLs (30 Units total) into the skin at bedtime. 02/08/14   Joseph Art, DO  isosorbide mononitrate (IMDUR) 60 MG 24 hr tablet TAKE 1 TABLET BY MOUTH DAILY 06/21/14   Runell Gess, MD  LEVEMIR FLEXTOUCH 100 UNIT/ML Pen Inject 20 Units into the skin daily. 04/02/14   Historical Provider, MD  Liraglutide 18 MG/3ML SOPN Inject 1.2 mg into the skin daily. Victoza    Historical Provider, MD  loratadine (CLARITIN) 10 MG tablet Take 1 tablet (10 mg total) by mouth daily as needed for allergies. 04/09/13   Sharee Holster, NP  nitroGLYCERIN (NITROSTAT) 0.4 MG SL tablet Place 1 tablet (0.4 mg total) under the tongue every 5 (five) minutes as needed for chest pain. 02/14/13   Rodolph Bong, MD  polyethylene glycol [place] / Ethelene Hal) packet Take 17 g by mouth daily as needed for mild constipation. 02/08/14   Jessica U Vann, DO   BP 150/105 mmHg  Pulse 68  Temp(Src) 97.9 F (36.6 C)  Resp 18  Ht 5\' 5"  (1.651 m)  Wt 190 lb (86.183 kg)  BMI 31.62 kg/m2  SpO2  99% Physical Exam  Constitutional: She appears well-developed and well-nourished. No distress.  HENT:  Head: Normocephalic and atraumatic.  Mouth/Throat: Oropharynx is clear and moist.  Eyes: EOM are normal. Pupils are equal, round, and reactive to light.  Neck: Normal range of motion. Neck supple.  Cardiovascular: Normal rate and regular rhythm.  Exam reveals no friction rub.   No murmur heard. Pulmonary/Chest: Effort normal and breath sounds normal. No respiratory distress. She has no wheezes. She has no rales.  Abdominal: Soft. She exhibits no distension. There is no tenderness. There is no rebound.  Musculoskeletal: Normal range of motion. She exhibits no edema.  Neurological: She is alert. She exhibits normal muscle tone. GCS eye subscore is  4. GCS verbal subscore is 4. GCS motor subscore is 6.  Oriented to person and place  Skin: No rash noted. She is not diaphoretic.  Nursing note and vitals reviewed.   ED Course  Procedures (including critical care time) Labs Review Labs Reviewed  CBC - Abnormal; Notable for the following:    Hemoglobin 11.2 (*)    HCT 34.9 (*)    RDW 16.7 (*)    All other components within normal limits  COMPREHENSIVE METABOLIC PANEL - Abnormal; Notable for the following:    Potassium 2.8 (*)    Creatinine, Ser 1.19 (*)    Calcium 8.8 (*)    Total Protein 6.2 (*)    Total Bilirubin 1.3 (*)    GFR calc non Af Amer 44 (*)    GFR calc Af Amer 51 (*)    All other components within normal limits  BRAIN NATRIURETIC PEPTIDE - Abnormal; Notable for the following:    B Natriuretic Peptide 1855.7 (*)    All other components within normal limits  URINALYSIS, ROUTINE W REFLEX MICROSCOPIC (NOT AT [facility]) - Abnormal; Notable for the following:    APPearance HAZY (*)    Protein, ur 100 (*)    Leukocytes, UA TRACE (*)    All other components within normal limits  URINE MICROSCOPIC-ADD ON - Abnormal; Notable for the following:    Squamous Epithelial / LPF FEW (*)     Bacteria, UA FEW (*)    All other components within normal limits  I-STAT TROPOININ, ED    Imaging Review Dg Chest 2 View  06/22/2014   CLINICAL DATA:  Three-day history of chest pain and exertional shortness of breath. Confusion.  EXAM: CHEST  2 VIEW  COMPARISON:  April 29, 2013  FINDINGS: There is atelectatic change in the right base. The lungs are otherwise clear. Heart is slightly enlarged with pulmonary vascularity within normal limits. Pacemaker leads are attached to the right atrium and right ventricle. No pneumothorax. No adenopathy. No bone lesions.  IMPRESSION: Atelectasis right base. No edema or consolidation. Stable cardiac prominence.   Electronically Signed   By: Bretta Bang III M.D.   On: 06/22/2014 11:26   Ct Head Wo Contrast  06/22/2014   CLINICAL DATA:  Confusion, chest pain beginning 3 days ago a LEFT chest, exertional dyspnea, history hypertension, systolic CHF, stroke, diabetes mellitus, dementia, coronary disease post MI and AICD, smoker  EXAM: CT HEAD WITHOUT CONTRAST  TECHNIQUE: Contiguous axial images were obtained from the base of the skull through the vertex without intravenous contrast.  COMPARISON:  02/10/2013  FINDINGS: Generalized atrophy.  Normal ventricular morphology.  No midline shift or mass effect.  Small vessel chronic ischemic changes of deep cerebral white matter.  Small old infarcts LEFT occipital and at anterior RIGHT basal ganglia.  No intracranial hemorrhage, mass lesion, or acute infarction.  Visualized paranasal sinuses and mastoid air cells clear.  Bones unremarkable.  IMPRESSION: Atrophy with small vessel chronic ischemic changes of deep cerebral white matter.  Small old LEFT occipital and RIGHT basal ganglia infarcts.  No acute intracranial abnormalities.   Electronically Signed   By: Ulyses Southward M.D.   On: 06/22/2014 11:01     EKG Interpretation   Date/Time:  Tuesday June 22 2014 11:40:13 EDT Ventricular Rate:  70 PR Interval:  207 QRS  Duration: 165 QT Interval:  474 QTC Calculation: 511 R Axis:   -65 Text Interpretation:  Sinus rhythm Atrial premature complex Probable left  atrial enlargement  Left bundle branch block Baseline wander in lead(s) V5  No significant change since last tracing Confirmed by [facility]  MD, Massie Mees  ([id number]) on 06/22/2014 11:43:42 AM      MDM   Final diagnoses:  Confusion  Chest pain, unspecified chest pain type  Diaphoresis    51F here with chest pain. EMS called for CP. Very little and poor history provided by patient and family to EMS. Here patient oriented to month, place, herself. She is acting silly though, getting mad at me for asking questions and giggling sometimes. She fell asleep on the toilet twice while we were obtaining a urine sample. No gross neurologic deficit, she wouldn't cooperate with a full neurologic exam. Will start with labs, CXR, Head CT.  Patient had a repeat episode of chest pain with diaphoresis. EKG similar to prior. Oxygen increase as shooting desatted also. Cardiology consulted. Medicine admitting.  Elwin Mocha, MD 06/22/14 1224

## 2014-06-22 NOTE — H&P (Signed)
[facility] History and Physical  Paula [name] [name]:[id number] DOB: 1940-02-06 DOA: 06/22/2014  Referring physician: Dr. [name] PCP: [name], MD   Chief Complaint: Shortness of Breath, Chest Pain  HPI: Paula [name] is a 74 y.o. female with PMH significant for Systolic CHF, hypertension, stroke, DM2, ICD and MI who presented to the ED with shortness of breath and left-sided chest pain that has been intermittent and increasing in severity over the past three days. She and her daughter are poor historians. She complains of increasing DOE, nausea, some vomiting a couple days ago, and sweating in addition to the pain under her left breast. Her chest pain was helped by nitroglycerin and has subsided. Her daughter said she hasn't eaten in two days now and doesn't understand why her CBG has increased.  Her daughter is concerned that restarting the patient's victoza recently has contributed to her symptoms.  In the ED she was uncooperative with staff at times, fell asleep twice on the toilet while staff were trying to obtain a urine sample. CXR showed atelectasis at the right base. CT showed atrophy with small vessel chronic ischemic changes of deep cerebral white matter along with small old LEFT occipital and RIGHT basal ganglia infarcts but no acute intracranial abnormalities. She is on 4L flow nasal cannula.  POC Trop is 0.07, BNP is 1855.  EKG is paced.     Review of Systems:  Constitutional: Positive for recent sweating, chills, fatigue.  HEENT:  Has headaches. No difficulty swallowing. Cardiovascular: Intermittent chest pain, orthopnea, PND, swelling in lower extremities, dizziness, palpitations. GI: Loss of appetite x 2 d. No heartburn, indigestion, abdominal pain, diarrhea, change in bowel habits. Resp: Shortness of breath with exertion. No productive cough, hemoptysis, wheezing, chest wall deformity  Skin: no rash or lesions.  GU: No dysuria, change in color of urine,  no urgency or frequency. No flank pain.  Musculoskeletal: No joint pain or swelling. No decreased range of motion. No back pain.  Psych: Patient has dementia and is intermittently confused.  Past Medical History  Diagnosis Date  . Nonischemic cardiomyopathy     last cath 09/21/08 - normal left main, LAD, LCx, ramus intermedius, RCA  . Hypertension   . Stroke   . Headache(784.0)   . Diabetes mellitus   . Dementia 05/27/2012  . Osteopenia     DEXA 2012 : T score hip -2.3, femur -2.1  . Chronic systolic CHF (congestive heart failure), NYHA class 2     nonischemic cardiopathy, EF 25% by 2-D echo February 2015  . 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 Royann Shivers  . Myocardial infarction 1991    Per pt report 2003, no ischemic evaluation  . Sentinel bleeding from cerebral aneurysm 1992       . Automatic implantable cardioverter-defibrillator in situ   . Hyperlipidemia    Past Surgical History  Procedure Laterality Date  . Back surgery    . Abdominal hysterectomy    . Cardiac catheterization      Paula [name] 12/22/2001  . Transthoracic echocardiogram  05/2010    EF 20-25%, mod conc hypertrophy, grade 3 diastolic dysfunction, elevated LVEDP; dilated MV annulus with mod regurg; LA severely dilated; RV mildly dilated; RA mod-severely dilated; mild-mod TR; PA peak pressure ; mod pulm hypertension  . Transthoracic echocardiogram  02/10/2013    EF 25%, mild AV regurg; LA mod dialted; RA mildly dilated; mod TR; PA peak  . Nm myocar  perf wall motion  08/2008    lexiscan - diaphragmatic attenuation of inferior wall, EF 37%  . Cardiac catheterization  12/21/2001    normal LAD, Cfx w/prox luminal irregularities, RCA dominant with diffuse luminal irregularities (Dr. Daiva Nakayama)  . Cardiac catheterization  09/21/2008    normal left main/LAD/Cfx/RCA, ramus intermedius is large and normal (Dr. Erlene Quan)  . Implantable cardioverter  defibrillator implant N/A 12/15/2010    Procedure: IMPLANTABLE CARDIOVERTER DEFIBRILLATOR IMPLANT;  Surgeon: Thurmon Fair, MD; Medtronic protecta XT DR    Social History:  reports that she has been smoking Cigarettes.  She has never used smokeless tobacco. She reports that she does not drink alcohol or use illicit drugs.  Lives with daughter and has help with ADLs.   Family History:  Mother had a stroke.  There is no history of premature CAD.  Prior to Admission medications   Medication Sig Start Date End Date Taking? Authorizing Provider  acetaminophen (TYLENOL) 325 MG tablet Take 2 tablets (650 mg total) by mouth 3 (three) times daily. 02/08/14   Joseph Art, DO  alendronate (FOSAMAX) 70 MG tablet TAKE 1 TABLET BY MOUTH EVERY WEEK AS DIRECTED 05/12/13   Oneal Grout, MD  ALPRAZolam Prudy Feeler) 0.25 MG tablet Take 1 tablet (0.25 mg total) by mouth at bedtime. 02/08/14   Joseph Art, DO  amLODipine (NORVASC) 10 MG tablet Take 10 mg by mouth at bedtime.     Historical Provider, MD  aspirin 81 MG chewable tablet Chew 1 tablet (81 mg total) by mouth daily. 02/08/14   Joseph Art, DO  carvedilol (COREG) 6.25 MG tablet TAKE 1 TABLET TWICE A DAY Patient taking differently: Take 6.25 mg by mouth 2 (two) times daily with a meal.  07/01/13   Runell Gess, MD  furosemide (LASIX) 40 MG tablet Take 1 tablet (40 mg total) by mouth 2 (two) times daily. 02/14/13   Rodolph Bong, MD  insulin aspart (NOVOLOG) 100 UNIT/ML injection Inject 0-9 Units into the skin 3 (three) times daily with meals. Patient taking differently: Inject 0-9 Units into the skin 3 (three) times daily with meals. SSI  151-200 2u; 201-250  4u; 251- 300  6u; 301-350 8u; 351-400  8u; > 400  Call MD 02/08/14   Joseph Art, DO  insulin detemir (LEVEMIR) 100 UNIT/ML injection Inject 0.3 mLs (30 Units total) into the skin at bedtime. 02/08/14   Joseph Art, DO  isosorbide mononitrate (IMDUR) 60 MG 24 hr tablet TAKE 1 TABLET BY MOUTH  DAILY 06/21/14   Runell Gess, MD  LEVEMIR FLEXTOUCH 100 UNIT/ML Pen Inject 20 Units into the skin daily. 04/02/14   Historical Provider, MD  Liraglutide 18 MG/3ML SOPN Inject 1.2 mg into the skin daily. Victoza    Historical Provider, MD  loratadine (CLARITIN) 10 MG tablet Take 1 tablet (10 mg total) by mouth daily as needed for allergies. 04/09/13   Sharee Holster, NP  nitroGLYCERIN (NITROSTAT) 0.4 MG SL tablet Place 1 tablet (0.4 mg total) under the tongue every 5 (five) minutes as needed for chest pain. 02/14/13   Rodolph Bong, MD  polyethylene glycol [facility] / Ethelene Hal) packet Take 17 g by mouth daily as needed for mild constipation. 02/08/14   Joseph Art, DO   Physical Exam: Filed Vitals:   06/22/14 1127 06/22/14 1130 06/22/14 1145 06/22/14 1334  BP: 149/99  150/105 137/87  Pulse: 76  68 76  Temp:      Resp:  Height:     (1.651 m)  Weight:    83.28 kg (183 lb 9.6 oz)  SpO2: 93%  99% 100%    Wt Readings from Last 3 Encounters:  06/22/14 83.28 kg (183 lb 9.6 oz)  02/07/14 86.047 kg (189 lb 11.2 oz)  01/08/14 88.86 kg (195 lb 14.4 oz)    General:  Appears calm and comfortable, intermittently anxious. Dtr. At bedside. Eyes: PERRL, normal lids and conjunctiva ENT: grossly normal hearing, lips & tongue Neck: no LAD, masses or thyromegaly Cardiovascular: RRR, no m/r/g. 3+ LE edema. Respiratory: RLL crackles no wheeze or rales. Increased respiratory effort. Abdomen: soft, nt nd, + BS, no masses Skin: no rash or induration seen on limited exam Musculoskeletal: grossly normal tone BUE/BLE Psychiatric: grossly normal mood and affect, speech fluent at times and intermittently tangential Neurologic: grossly non-focal.          Labs on Admission:  Basic Metabolic Panel:  Recent Labs Lab 06/22/14 1018  NA 137  K 2.8*  CL 104  CO2 23  GLUCOSE 65  BUN 14  CREATININE 1.19*  CALCIUM 8.8*   Liver Function Tests:  Recent Labs Lab 06/22/14 1018  AST  22  ALT 27  ALKPHOS 80  BILITOT 1.3*  PROT 6.2*  ALBUMIN 3.6   CBC:  Recent Labs Lab 06/22/14 1018  WBC 5.8  HGB 11.2*  HCT 34.9*  MCV 88.8  PLT 253    BNP (last 3 results)  Recent Labs  06/22/14 1018  BNP 1855.7*    Radiological Exams on Admission: Dg Chest 2 View  06/22/2014   CLINICAL DATA:  Three-day history of chest pain and exertional shortness of breath. Confusion.  EXAM: CHEST  2 VIEW  COMPARISON:  April 29, 2013  FINDINGS: There is atelectatic change in the right base. The lungs are otherwise clear. Heart is slightly enlarged with pulmonary vascularity within normal limits. Pacemaker leads are attached to the right atrium and right ventricle. No pneumothorax. No adenopathy. No bone lesions.  IMPRESSION: Atelectasis right base. No edema or consolidation. Stable cardiac prominence.   Electronically Signed   By: Bretta Bang III M.D.   On: 06/22/2014 11:26   Ct Head Wo Contrast  06/22/2014   CLINICAL DATA:  Confusion, chest pain beginning 3 days ago a LEFT chest, exertional dyspnea, history hypertension, systolic CHF, stroke, diabetes mellitus, dementia, coronary disease post MI and AICD, smoker  EXAM: CT HEAD WITHOUT CONTRAST  TECHNIQUE: Contiguous axial images were obtained from the base of the skull through the vertex without intravenous contrast.  COMPARISON:  02/10/2013  FINDINGS: Generalized atrophy.  Normal ventricular morphology.  No midline shift or mass effect.  Small vessel chronic ischemic changes of deep cerebral white matter.  Small old infarcts LEFT occipital and at anterior RIGHT basal ganglia.  No intracranial hemorrhage, mass lesion, or acute infarction.  Visualized paranasal sinuses and mastoid air cells clear.  Bones unremarkable.  IMPRESSION: Atrophy with small vessel chronic ischemic changes of deep cerebral white matter.  Small old LEFT occipital and RIGHT basal ganglia infarcts.  No acute intracranial abnormalities.   Electronically Signed   By: Ulyses Southward M.D.   On: 06/22/2014 11:01    EKG: Independently reviewed. Sinus / Paced  Assessment/Plan Principal Problem:   Systolic and diastolic CHF, acute on chronic Active Problems:   HTN (hypertension)   Dementia   ICD (implantable cardioverter-defibrillator) in place   Paroxysmal atrial fibrillation   DM (  diabetes mellitus), type 2 with renal complications   Chest pain   Chest pain at rest   Hypokalemia   Hypoxia  Acute on chronic CHF Exhibits dyspnea on exertion, 3+ LE edema, R lung base atelectasis on CXR Low salt diet, Strict I/Os, Daily weight monitoring  Imdur 60 mg po QD, Coreg,  and Lasix 40 mg IV BID Appreciate Cardiology consultation.  Acute respiratory failure with hypoxia Patient easily fatigued on exertion, likely due to Acute CHF exacerbation, diuresis should improve this O2 sats drop to 76% on room air O2 Nasal cannula at 4L flow currently, keep O2 sat >92% Ambulating oxygen sats in AM on 6/22  Chest pain at rest, with hx of MI and ICD in place Patient currently denies chest pain Serial troponin to rule out cardiac cause Cardiology consult Continue home aspirin regimen and nitroglycerin prn  Paroxysmal atrial fibrillation Currently stable but patient reports palpitations on exertion Carvedilol 6.25 mg po BID Daily aspirin 81 mg.  Not on anticoag.  DM2 with renal complications Blood glucose 65 at 1018, family history of CBG unreliable Obtain A1C, BMP Carb-modified diet Sliding scale, novolog, levemir  Hypokalemia Potassium 2.8 today.  Check Mag.   Give oral potassium chloride x 3 doses.   Hypertension BP ranging from 137/87 - 159/136 Continue amlodipine, coreg, imdur, home regimen   UTI Some bacteria and trace leukocytes found on UA Follow culture.  Will hold off on antibiotics for now.  Dementia Patient is at baseline, intermittently confused    Consults: Cardiology, PT, OT Code Status: DNR DVT Prophylaxis: Heparin Family  Communication: Daughter at bedside Disposition Plan: Home in 48 - 72 hours.  Time spent: 60 minutes  [place], Brandy Hale [facility] Pager [phone]

## 2014-06-22 NOTE — ED Notes (Signed)
Pt arrives from home via [facility]. Pt states she began having cp 3 days ago in her left chest and has exertional sob. Pt denies weakness, dizziness or back pain. Pt is atrial paced.

## 2014-06-22 NOTE — Progress Notes (Signed)
*  PRELIMINARY RESULTS* Echocardiogram 2D Echocardiogram has been performed.  Jeryl Columbia 06/22/2014, 3:15 PM

## 2014-06-22 NOTE — ED Notes (Signed)
Iv team at bedside  

## 2014-06-22 NOTE — Progress Notes (Addendum)
NURSING PROGRESS NOTE  [name] [id number] Admission Data: 06/22/2014 3:35 PM Attending Provider: Zannie Cove, MD FHQ:[id number],PQDIYM M, MD Code Status: DNR   Paula [name] is a 74 y.o. female patient admitted from ED:  -No acute distress noted.  -No complaints of shortness of breath.  -No complaints of chest pain.   Cardiac Monitoring: Box # 37 in place. Cardiac monitor yields:paced  Blood pressure 137/87, pulse 76, temperature 97.9 F (36.6 C), resp. rate 18, height 5\' 5"  (1.651 m), weight 83.28 kg (183 lb 9.6 oz), SpO2 100 %.   IV Fluids:  IV in place, occlusive dsg intact without redness, IV cath hand right, condition no redness none.   Allergies:  Review of patient's allergies indicates no known allergies.  Past Medical History:   has a past medical history of Nonischemic cardiomyopathy; Hypertension; Stroke; Headache(784.0); Diabetes mellitus; Dementia (05/27/2012); Osteopenia; Chronic systolic CHF (congestive heart failure), NYHA class 2; ICD (implantable cardioverter-defibrillator) in place (12/2010); Myocardial infarction (1991); Sentinel bleeding from cerebral aneurysm (1992); Automatic implantable cardioverter-defibrillator in situ; and Hyperlipidemia.  Past Surgical History:   has past surgical history that includes Back surgery; Abdominal hysterectomy; Cardiac catheterization; transthoracic echocardiogram (05/2010); transthoracic echocardiogram (02/10/2013); nm myocar perf wall motion (08/2008); Cardiac catheterization (12/21/2001); Cardiac catheterization (09/21/2008); and implantable cardioverter defibrillator implant (N/A, 12/15/2010).  Social History:   reports that she has been smoking Cigarettes.  She has never used smokeless tobacco. She reports that she does not drink alcohol or use illicit drugs.  Skin: intact, however patient has bilateral lower extremity edema   Patient/Family orientated to room. Information packet given to patient/family. Admission  inpatient armband information verified with patient/family to include name and date of birth and placed on patient arm. Side rails up x 2, fall assessment and education completed with patient/family. Patient/family able to verbalize understanding of risk associated with falls and verbalized understanding to call for assistance before getting out of bed. Call light within reach. Patient/family able to voice and demonstrate understanding of unit orientation instructions.    Attempted to complete admission but both patient or her daughter in the room are poor historians. Will wait for the daughter that the  Patient lives with to assist with completing the admission.  Will continue to evaluate and treat per MD orders.

## 2014-06-22 NOTE — ED Notes (Addendum)
Pt was in restroom for longer than anticipated and pt was found sleeping on the toilet. Pt states she's very sleepy. Dr. [name] made aware.

## 2014-06-22 NOTE — Consult Note (Signed)
CARDIOLOGY CONSULT NOTE   Patient ID: Paula [name] MRN: [id number] DOB/AGE: September 15, 1940 74 y.o.  Admit date: 06/22/2014  Primary Physician   [name], MD Primary Cardiologist  Dr Allyson Sabal, ICD (with Optivol): Dr Royann Shivers  Reason for Consultation   CHF   WUJ:WJXBJ A Paula [name] is a 74 y.o. year old female with a history of NICM, S-CHF (EF 25% 2015), CVA, DM, and dementia. Her Optivol readings began trending up in late April. Her home Lasix dose was increased from 40 mg bid>>80 mg bid on 06/17, to be increased for 3 days.    She was admitted 06/21 am after EMS was called for chest pain. Her oxygen levels were decreasing and her BNP was elevated and she is felt to have CHF exacerbation despite a CXR that does not show a great amount of fluid. Cardiology was asked to evaluate her.    Paula [name] lives with her daughter and her other daughter assists in her care. Her daughters are able to provide a great deal of information about her condition. Paula [name] was previously able to walk approximately 100 feet without stopping. She would do things such as walked to the mailbox and get the trash cans in from the street. She moved around the house and did not require a walker or assistance.   However, over the last 2 weeks or so, there has been a significant increase in her shortness of breath and a significant decrease in her activity. She describes PND and there is possibly orthopnea. She has been getting short of breath walking room-room. She has not been doing much outside the house and has been decreasing her activity significantly because of her shortness of breath. 3 days ago, her daughter noticed increasing lower extremity edema, but that improved prior to coming to the hospital today. The patient spends most of her time sitting in a recliner or lying in bed.  Her daughter that assist in her care states that she was not told increase her Lasix although notes dated 06/17  described discussion with one of her daughters but the daughter was asking about an inhaler. Per RN progress note dated 06/17, the patient's daughter Paula [name] was asked to increase the patient's Lasix from 40 mg twice a day up to 80 mg twice a day for 3 days. It is not completely clear that this happened. Additionally, her daughter reports problems with nausea and poor by mouth intake. There has been some vomiting. This has been felt to be a side effect of the Victoza, and the dose has been decreased, but without resolving symptoms. Because of the nausea and vomiting, her by mouth intake has been poor and she has lost weight. According to her daughter, her weight is down 5 pounds.  Today, the patient began complaining of chest pain. She is unable to rate it but says it was very bad. Upon further questioning, the chest pain seems mostly related to difficulties with shortness of breath. Her chest wall is nontender. Her chest pain has resolved. Her shortness of breath has improved but she has not been out of bed.    Past Medical History  Diagnosis Date  . Nonischemic cardiomyopathy     last cath 09/21/08 - normal left main, LAD, LCx, ramus intermedius, RCA  . Hypertension   . Stroke   . Headache(784.0)   . Diabetes mellitus   . Dementia 05/27/2012  . Osteopenia     DEXA 2012 : T score hip -2.3,  femur -2.1  . Chronic systolic CHF (congestive heart failure), NYHA class 2     nonischemic cardiopathy, EF 25% by 2-D echo February 2015  . 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 Royann Shivers  . Myocardial infarction 1991    Per pt report 2003, no ischemic evaluation  . Sentinel bleeding from cerebral aneurysm 1992       . Automatic implantable cardioverter-defibrillator in situ   . Hyperlipidemia      Past Surgical History  Procedure Laterality Date  . Back surgery    . Abdominal hysterectomy    . Cardiac catheterization      Paula [name]  12/22/2001  . Transthoracic echocardiogram  05/2010    EF 20-25%, mod conc hypertrophy, grade 3 diastolic dysfunction, elevated LVEDP; dilated MV annulus with mod regurg; LA severely dilated; RV mildly dilated; RA mod-severely dilated; mild-mod TR; PA peak pressure ; mod pulm hypertension  . Transthoracic echocardiogram  02/10/2013    EF 25%, mild AV regurg; LA mod dialted; RA mildly dilated; mod TR; PA peak  . Nm myocar perf wall motion  08/2008    lexiscan - diaphragmatic attenuation of inferior wall, EF 37%  . Cardiac catheterization  12/21/2001    normal LAD, Cfx w/prox luminal irregularities, RCA dominant with diffuse luminal irregularities (Dr. Daiva Nakayama)  . Cardiac catheterization  09/21/2008    normal left main/LAD/Cfx/RCA, ramus intermedius is large and normal (Dr. Erlene Quan)  . Implantable cardioverter defibrillator implant N/A 12/15/2010    Procedure: IMPLANTABLE CARDIOVERTER DEFIBRILLATOR IMPLANT;  Surgeon: Thurmon Fair, MD; Medtronic protecta XT DR     No Known Allergies  I have reviewed the patient's current medications . acetaminophen  650 mg Oral TID  . amLODipine  10 mg Oral QHS  . aspirin  81 mg Oral Daily  . carvedilol  6.25 mg Oral BID WC  . docusate sodium  100 mg Oral BID  . furosemide  40 mg Intravenous BID  . heparin  5,000 Units Subcutaneous 3 times per day  . insulin aspart  0-15 Units Subcutaneous TID WC  . insulin aspart  3 Units Subcutaneous TID WC  . insulin detemir  20 Units Subcutaneous QHS  . isosorbide mononitrate  60 mg Oral Daily  . potassium chloride  40 mEq Oral Once  . potassium chloride  40 mEq Oral BID  . sodium chloride  3 mL Intravenous Q12H  . sodium chloride  3 mL Intravenous Q12H     sodium chloride, acetaminophen **OR** acetaminophen, ALPRAZolam, bisacodyl, loratadine, nitroGLYCERIN, ondansetron **OR** ondansetron (ZOFRAN) IV, polyethylene glycol, sodium chloride  Medication Sig  acetaminophen (TYLENOL) 325 MG tablet  Take 2 tablets (650 mg total) by mouth 3 (three) times daily.  alendronate (FOSAMAX) 70 MG tablet TAKE 1 TABLET BY MOUTH EVERY WEEK AS DIRECTED  ALPRAZolam (XANAX) 0.25 MG tablet Take 1 tablet (0.25 mg total) by mouth at bedtime.  amLODipine (NORVASC) 10 MG tablet Take 10 mg by mouth at bedtime.   aspirin 81 MG chewable tablet Chew 1 tablet (81 mg total) by mouth daily.  carvedilol (COREG) 6.25 MG tablet TAKE 1 TABLET TWICE A DAY Patient taking differently: Take 6.25 mg by mouth 2 (two) times daily with a meal.   furosemide (LASIX) 40 MG tablet Take 1 tablet (40 mg total) by mouth 2 (two) times daily.  insulin aspart (NOVOLOG) 100 UNIT/ML injection Inject 0-9 Units into the skin 3 (three) times daily with meals. Patient taking differently:  Inject 0-9 Units into the skin 3 (three) times daily with meals. SSI  151-200 2u; 201-250  4u; 251- 300  6u; 301-350 8u; 351-400  8u; > 400  Call MD  insulin detemir (LEVEMIR) 100 UNIT/ML injection Inject 0.3 mLs (30 Units total) into the skin at bedtime.  isosorbide mononitrate (IMDUR) 60 MG 24 hr tablet TAKE 1 TABLET BY MOUTH DAILY  LEVEMIR FLEXTOUCH 100 UNIT/ML Pen Inject 20 Units into the skin daily.  Liraglutide 18 MG/3ML SOPN Inject 1.2 mg into the skin daily. Victoza  loratadine (CLARITIN) 10 MG tablet Take 1 tablet (10 mg total) by mouth daily as needed for allergies.  nitroGLYCERIN (NITROSTAT) 0.4 MG SL tablet Place 1 tablet (0.4 mg total) under the tongue every 5 (five) minutes as needed for chest pain.  polyethylene glycol (MIRALAX / GLYCOLAX) packet Take 17 g by mouth daily as needed for mild constipation.     History   Social History  . Marital Status: Single    Spouse Name: N/A  . Number of Children: N/A  . Years of Education: 10   Occupational History  . Retired    Social History Main Topics  . Smoking status: Current Every Day Smoker    Types: Cigarettes  . Smokeless tobacco: Never Used  . Alcohol Use: No  . Drug Use: No  .  Sexual Activity: No   Other Topics Concern  . Not on file   Social History Narrative   Lives with her daughter in [place], [place].    Family Status  Relation Status Death Age  . Mother Deceased     No hx premature CAD  . Father Deceased     Murder, no hx premature CAD   Family History  Problem Relation Age of Onset  . Stroke Mother      ROS:  Full 14 point review of systems complete and found to be negative unless listed above.  Physical Exam: Blood pressure 137/87, pulse 76, temperature 97.9 F (36.6 C), resp. rate 18, height  (1.651 m), weight 183 lb 9.6 oz (83.28 kg), SpO2 100 %.  General: Well developed, well nourished, female in no acute distress Head: Eyes PERRLA, No xanthomas.   Normocephalic and atraumatic, oropharynx without edema or exudate. Dentition: Poor  Lungs: Decreased breath sounds bases with a few rales Heart:  Heart irregular rate and rhythm with S1, S2, soft systolic  murmur. pulses are 2+ all 4 extrem.   Neck: No carotid bruits. No lymphadenopathy.  JVD to jaw. Abdomen: Bowel sounds present, abdomen soft and non-tender without masses or hernias noted. Msk:  No spine or cva tenderness. No weakness, no joint deformities or effusions. Extremities: No clubbing or cyanosis. Trace edema.  Neuro: Alert and oriented X 3. No focal deficits noted. Psych:  Good affect, responds appropriately Skin: No rashes or lesions noted.  Labs:   Lab Results  Component Value Date   WBC 5.8 06/22/2014   HGB 11.2* 06/22/2014   HCT 34.9* 06/22/2014   MCV 88.8 06/22/2014   PLT 253 06/22/2014     Recent Labs Lab 06/22/14 1018  NA 137  K 2.8*  CL 104  CO2 23  BUN 14  CREATININE 1.19*  CALCIUM 8.8*  PROT 6.2*  BILITOT 1.3*  ALKPHOS 80  ALT 27  AST 22  GLUCOSE 65  ALBUMIN 3.6    Recent Labs  06/22/14 1038  TROPIPOC 0.07   B NATRIURETIC PEPTIDE  Date/Time Value Ref Range Status  06/22/2014 10:18 AM  1855.7* 0.0 - 100.0 pg/mL Final   Echo:  02/10/2013 Study Conclusions - Left ventricle: The cavity size was severely dilated. Wall thickness was increased in a pattern of mild LVH. The estimated ejection fraction was 25%. Diffuse hypokinesis. - Aortic valve: Mild regurgitation. - Mitral valve: Moderate regurgitation. - Left atrium: The atrium was moderately dilated. - Right atrium: The atrium was mildly dilated. - Atrial septum: No defect or patent foramen ovale was identified. - Tricuspid valve: Moderate regurgitation. - Pulmonary arteries: PA peak pressure: 54mm Hg (S). - Impressions: Restrictive mitral filling with elevated EDP by E/E' ratio Impressions: - Restrictive mitral filling with elevated EDP by E/E' ratio  ECG:  06/22/2014 SR, PACs Vent. rate 70 BPM PR interval 207 ms QRS duration 165 ms QT/QTc 474/511 ms P-R-T axes 47 -65 93  Radiology:  Dg Chest 2 View 06/22/2014   CLINICAL DATA:  Three-day history of chest pain and exertional shortness of breath. Confusion.  EXAM: CHEST  2 VIEW  COMPARISON:  April 29, 2013  FINDINGS: There is atelectatic change in the right base. The lungs are otherwise clear. Heart is slightly enlarged with pulmonary vascularity within normal limits. Pacemaker leads are attached to the right atrium and right ventricle. No pneumothorax. No adenopathy. No bone lesions.  IMPRESSION: Atelectasis right base. No edema or consolidation. Stable cardiac prominence.   Electronically Signed   By: Bretta Bang III M.D.   On: 06/22/2014 11:26   Ct Head Wo Contrast 06/22/2014   CLINICAL DATA:  Confusion, chest pain beginning 3 days ago a LEFT chest, exertional dyspnea, history hypertension, systolic CHF, stroke, diabetes mellitus, dementia, coronary disease post MI and AICD, smoker  EXAM: CT HEAD WITHOUT CONTRAST  TECHNIQUE: Contiguous axial images were obtained from the base of the skull through the vertex without intravenous contrast.  COMPARISON:  02/10/2013  FINDINGS: Generalized atrophy.   Normal ventricular morphology.  No midline shift or mass effect.  Small vessel chronic ischemic changes of deep cerebral white matter.  Small old infarcts LEFT occipital and at anterior RIGHT basal ganglia.  No intracranial hemorrhage, mass lesion, or acute infarction.  Visualized paranasal sinuses and mastoid air cells clear.  Bones unremarkable.  IMPRESSION: Atrophy with small vessel chronic ischemic changes of deep cerebral white matter.  Small old LEFT occipital and RIGHT basal ganglia infarcts.  No acute intracranial abnormalities.   Electronically Signed   By: Ulyses Southward M.D.   On: 06/22/2014 11:01    ASSESSMENT AND PLAN:   The patient was seen today by Dr. Patty Sermons, the patient evaluated and the data reviewed.  Principal Problem:   Systolic and diastolic CHF, acute on chronic  Diuresis, following renal function and electrolytes closely. We will give the dose of Lasix supposed to be at 6 PM now.  Otherwise per IM, will make sure she gets 120 mEq of Kdur today   Active Problems:   HTN (hypertension)   Dementia   ICD (implantable cardioverter-defibrillator) in place   Paroxysmal atrial fibrillation   DM (diabetes mellitus), type 2 with renal complications   Chest pain   Chest pain at rest   Hypokalemia   Hypoxia   Signed: Leanna Battles 06/22/2014 2:17 PM Beeper [phone]  Co-Sign MD The patient was seen.  Discussed with Theodore Demark PA-C.  The patient has evidence of increasing fluid overload despite the fact that her overall weight has been declining.  She has been eating poorly which could account for the weight loss.  Her jugular venous  pressure is significantly elevated.  Her B natruretic peptide level is elevated and her optivol ratings have suggested increasing fluid retention and decreased thoracic impedance.  Over the weekend she did have moderate pedal edema according to the daughter but this has resolved with additional Lasix and with the fact that she has been at  bedrest most of the weekend.  Her significant hypokalemia reflects the effect of the additional diuretic over the weekend and will be aggressively repleted.  I agree with assessment and plan as noted above.  Her EKG was personally reviewed and shows normal sinus rhythm with left bundle branch block and left axis deviation.  Occasional PACs are present.

## 2014-06-22 NOTE — ED Notes (Signed)
Pt desat down to 76% on RA. Pt is extremely diaphoretic and has c/o SOB. Pt placed on 3 L and Dr. [name] notified.

## 2014-06-22 NOTE — ED Notes (Signed)
Dr. [name] at bedside. Pt placed on 5L of O2 via [place].

## 2014-06-22 NOTE — Progress Notes (Signed)
PT Cancellation Note  Patient Details Name: Paula [name] MRN: [id number] DOB: 1940-10-08   Cancelled Treatment:    Reason Eval/Treat Not Completed: Medical issues which prohibited therapy Pt on bedrest until 6/22 at 12:03 am. Will await increase in activity orders prior to initiation of PT evaluation.   Blake Divine A Klani Caridi 06/22/2014, 3:24 PM Mylo Red, PT, DPT [phone]

## 2014-06-22 NOTE — ED Notes (Signed)
Admitting at the bedside. Pt is ready to be transported upstairs. Awaiting MD to finish assessment.

## 2014-06-23 ENCOUNTER — Other Ambulatory Visit: Payer: Self-pay | Admitting: *Deleted

## 2014-06-23 DIAGNOSIS — R079 Chest pain, unspecified: Secondary | ICD-10-CM

## 2014-06-23 DIAGNOSIS — I480 Paroxysmal atrial fibrillation: Secondary | ICD-10-CM

## 2014-06-23 DIAGNOSIS — I10 Essential (primary) hypertension: Secondary | ICD-10-CM

## 2014-06-23 LAB — CBC
HEMATOCRIT: 34.4 % — AB (ref 36.0–46.0)
HEMOGLOBIN: 11 g/dL — AB (ref 12.0–15.0)
MCH: 28.1 pg (ref 26.0–34.0)
MCHC: 32 g/dL (ref 30.0–36.0)
MCV: 88 fL (ref 78.0–100.0)
Platelets: 249 10*3/uL (ref 150–400)
RBC: 3.91 MIL/uL (ref 3.87–5.11)
RDW: 16.9 % — ABNORMAL HIGH (ref 11.5–15.5)
WBC: 8 10*3/uL (ref 4.0–10.5)

## 2014-06-23 LAB — GLUCOSE, CAPILLARY
Glucose-Capillary: 215 mg/dL — ABNORMAL HIGH (ref 65–99)
Glucose-Capillary: 86 mg/dL (ref 65–99)
Glucose-Capillary: 115 mg/dL — ABNORMAL HIGH (ref 65–99)
Glucose-Capillary: 109 mg/dL — ABNORMAL HIGH (ref 65–99)

## 2014-06-23 LAB — BASIC METABOLIC PANEL
ANION GAP: 12 (ref 5–15)
BUN: 18 mg/dL (ref 6–20)
CHLORIDE: 102 mmol/L (ref 101–111)
CO2: 21 mmol/L — ABNORMAL LOW (ref 22–32)
CREATININE: 1.4 mg/dL — AB (ref 0.44–1.00)
Calcium: 8.7 mg/dL — ABNORMAL LOW (ref 8.9–10.3)
GFR calc Af Amer: 42 mL/min — ABNORMAL LOW (ref >60)
GFR calc non Af Amer: 36 mL/min — ABNORMAL LOW (ref >60)
GLUCOSE: 71 mg/dL (ref 65–99)
Potassium: 4.9 mmol/L (ref 3.5–5.1)
Sodium: 135 mmol/L (ref 135–145)

## 2014-06-23 LAB — TROPONIN I: TROPONIN I: 0.07 ng/mL — AB (ref <0.031)

## 2014-06-23 LAB — HEMOGLOBIN A1C
Hgb A1c MFr Bld: 7.1 % — ABNORMAL HIGH (ref 4.8–5.6)
MEAN PLASMA GLUCOSE: 157 mg/dL

## 2014-06-23 NOTE — Evaluation (Signed)
Physical Therapy Evaluation Patient Details Name: Paula [name] MRN: [id number] DOB: 29-Jun-1940 Today's Date: 06/23/2014   History of Present Illness  Paula [name] is a 74 y.o. female with PMH significant for Systolic CHF, hypertension, stroke, DM2, ICD and MI who presented to the ED with shortness of breath and left-sided chest pain that has been intermittent and increasing in severity over the past three days.  Clinical Impression  Pt admitted with above diagnosis. Pt currently with functional limitations due to the deficits listed below (see PT Problem List).  Pt will benefit from skilled PT to increase their independence and safety with mobility to allow discharge to the venue listed below.    Session conducted on Room Air and O2 sats remained greater than or equal to 92%     Follow Up Recommendations Home health PT;Supervision/Assistance - 24 hour    Equipment Recommendations  3in1 (PT)    Recommendations for Other Services       Precautions / Restrictions Precautions Precautions: Fall      Mobility  Bed Mobility Overal bed mobility: Needs Assistance Bed Mobility: Supine to Sit     Supine to sit: Min assist     General bed mobility comments: min handheld assist to pull to sit  Transfers Overall transfer level: Needs assistance Equipment used: Rolling walker (2 wheeled) Transfers: Sit to/from Stand Sit to Stand: Min guard         General transfer comment: [place] for safety; cues for safety and hand placement; pt quite impulsive getting to [facility]  Ambulation/Gait Ambulation/Gait assistance: Min assist;Mod assist Ambulation Distance (Feet): 100 Feet (x2) Assistive device: Rolling walker (2 wheeled) Gait Pattern/deviations: Step-through pattern;Trunk flexed     General Gait Details: Impuslively fast; used Dinamap monitor in front of pt to keep her speed under control (with dubious success); one seated rest break  Stairs            Wheelchair  Mobility    Modified Rankin (Stroke Patients Only)       Balance Overall balance assessment: Needs assistance           Standing balance-Leahy Scale: Poor Standing balance comment: Dependent on UE support                             Pertinent Vitals/Pain Pain Assessment: No/denies pain    Home Living Family/patient expects to be discharged to:: Private residence Living Arrangements: Children Available Help at Discharge: Family;Personal care attendant;Available 24 hours/day;Available PRN/intermittently (PCA for 6 hrs/day) Type of Home: House Home Access: Stairs to enter Entrance Stairs-Rails: None Entrance Stairs-Number of Steps: 3 Home Layout: One level Home Equipment: Environmental consultant - 2 wheels      Prior Function Level of Independence: Needs assistance   Gait / Transfers Assistance Needed: RW for all gait  ADL's / Homemaking Assistance Needed: Min A for bathing and dressing        Hand Dominance   Dominant Hand: Right    Extremity/Trunk Assessment   Upper Extremity Assessment: Overall WFL for tasks assessed           Lower Extremity Assessment: Generalized weakness         Communication   Communication: No difficulties  Cognition Arousal/Alertness: Awake/alert Behavior During Therapy: WFL for tasks assessed/performed Overall Cognitive Status: No family/caregiver present to determine baseline cognitive functioning  General Comments General comments (skin integrity, edema, etc.): Session conducted on Room Air and O2 sats remained greater than or equal to 92%    Exercises        Assessment/Plan    PT Assessment Patient needs continued PT services  PT Diagnosis Difficulty walking;Generalized weakness   PT Problem List Decreased strength;Decreased activity tolerance;Decreased balance;Decreased mobility;Decreased cognition;Decreased knowledge of use of DME  PT Treatment Interventions DME instruction;Gait  training;Stair training;Functional mobility training;Therapeutic activities;Therapeutic exercise;Balance training;Neuromuscular re-education;Cognitive remediation;Patient/family education   PT Goals (Current goals can be found in the Care Plan section) Acute Rehab PT Goals Patient Stated Goal: did not state, but agreeable to amb PT Goal Formulation: Patient unable to participate in goal setting Time For Goal Achievement: 07/07/14    Frequency Min 3X/week   Barriers to discharge        Co-evaluation               End of Session Equipment Utilized During Treatment: Gait belt;Other (comment) (pulse oximeter) Activity Tolerance: Patient tolerated treatment well Patient left: in chair;with call bell/phone within reach;with chair alarm set Nurse Communication: Mobility status         Time: 1610-9604 PT Time Calculation (min) (ACUTE ONLY): 37 min   Charges:   PT Evaluation $Initial PT Evaluation Tier I: 1 Procedure PT Treatments $Gait Training: 8-22 mins   PT G Codes:        Olen Pel 06/23/2014, 3:57 PM  Van Clines, [place]  Acute Rehabilitation Services Pager [phone] Office [phone]

## 2014-06-23 NOTE — Progress Notes (Signed)
UR completed.    J[place] W. Lakysha Kossman, RN, BSN  Trauma/Neuro ICU Case Manager [phone] 

## 2014-06-23 NOTE — Progress Notes (Signed)
Patient Name: Paula [name] Date of Encounter: 06/23/2014  Primary Cardiologist Dr Allyson Sabal, ICD (with Optivol): Dr Royann Shivers   Principal Problem:   Systolic and diastolic CHF, acute on chronic Active Problems:   HTN (hypertension)   Dementia   ICD (implantable cardioverter-defibrillator) in place   Paroxysmal atrial fibrillation   DM (diabetes mellitus), type 2 with renal complications   Chest pain   Chest pain at rest   Hypokalemia   Hypoxia    SUBJECTIVE   Intermittent chest discomfort, although patient dementia prevent her from communicating clearly. SOB improving.  CURRENT MEDS . acetaminophen  650 mg Oral TID  . amLODipine  10 mg Oral QHS  . aspirin  81 mg Oral Daily  . carvedilol  6.25 mg Oral BID WC  . docusate sodium  100 mg Oral BID  . furosemide  40 mg Intravenous BID  . heparin  5,000 Units Subcutaneous 3 times per day  . insulin aspart  0-15 Units Subcutaneous TID WC  . insulin aspart  3 Units Subcutaneous TID WC  . insulin detemir  20 Units Subcutaneous QHS  . isosorbide mononitrate  60 mg Oral Daily  . potassium chloride  40 mEq Oral BID  . sodium chloride  3 mL Intravenous Q12H  . sodium chloride  3 mL Intravenous Q12H    OBJECTIVE  Filed Vitals:   06/22/14 1744 06/22/14 2028 06/23/14 0129 06/23/14 0449  BP: 148/88 151/91 135/77 127/90  Pulse: 86 95 79 85  Temp:  97.6 F (36.4 C) 98 F (36.7 C) 98.1 F (36.7 C)  TempSrc:  Oral Oral Oral  Resp:  Height:      Weight:    184 lb 12.8 oz (83.825 kg)  SpO2:  100% 100% 100%    Intake/Output Summary (Last 24 hours) at 06/23/14 1016 Last data filed at 06/23/14 0900  Gross per 24 hour  Intake    645 ml  Output    650 ml  Net     -5 ml   Filed Weights   06/22/14 0954 06/22/14 1334 06/23/14 0449  Weight: 190 lb (86.183 kg) 183 lb 9.6 oz (83.28 kg) 184 lb 12.8 oz (83.825 kg)    PHYSICAL EXAM  General: Pleasant, NAD. Neuro: Alert and oriented X 3. Moves all extremities  spontaneously. Psych: Normal affect. HEENT:  Normal  Neck: Supple without bruits or JVD. Lungs:  Resp regular and unlabored, CTA. Heart: RRR no s3, s4, or murmurs. Abdomen: Soft, non-tender, non-distended, BS + x 4.  Extremities: No clubbing, cyanosis. DP/PT/Radials 2+ and equal bilaterally. 2+ nonpitting edema  Accessory Clinical Findings  CBC  Recent Labs  06/22/14 1018 06/23/14 0250  WBC 5.8 8.0  HGB 11.2* 11.0*  HCT 34.9* 34.4*  MCV 88.8 88.0  PLT 253 249   Basic Metabolic Panel  Recent Labs  06/22/14 1018 06/22/14 1924 06/23/14 0126  NA 137  --  135  K 2.8*  --  4.9  CL 104  --  102  CO2 23  --  21*  GLUCOSE 65  --  71  BUN 14  --  18  CREATININE 1.19*  --  1.40*  CALCIUM 8.8*  --  8.7*  MG  --  1.9  --    Liver Function Tests  Recent Labs  06/22/14 1018  AST 22  ALT 27  ALKPHOS 80  BILITOT 1.3*  PROT 6.2*  ALBUMIN 3.6   Cardiac Enzymes  Recent Labs  06/22/14  1545 06/22/14 1924 06/23/14 0126  TROPONINI 0.07* 0.07* 0.07*   Hemoglobin A1C  Recent Labs  06/22/14 1545  HGBA1C 7.1*   Thyroid Function Tests  Recent Labs  06/22/14 1545  TSH 1.857    TELE NSR with PVCs, no significant ventricular ectopy    ECG  No new EKG  Echocardiogram 06/22/2014  LV EF: 15% -  20%  ------------------------------------------------------------------- Indications:   Dyspnea 786.09.  ------------------------------------------------------------------- History:  PMH:  Congestive heart failure. PMH:  Myocardial infarction. Risk factors: Hypertension. Diabetes mellitus. Dyslipidemia.  ------------------------------------------------------------------- Study Conclusions  - Left ventricle: The cavity size was mildly dilated. There was moderate concentric hypertrophy. Systolic function was severely reduced. The estimated ejection fraction was in the range of 15% to 20%. Wall motion was normal; there were no regional  wall motion abnormalities. - Ventricular septum: Septal motion showed paradox. - Mitral valve: There was moderate regurgitation. - Left atrium: The atrium was severely dilated. - Right ventricle: Systolic function was mildly reduced. - Right atrium: The atrium was moderately dilated. - Pulmonary arteries: Systolic pressure was moderately increased. PA peak pressure: 45 mm Hg (S).  Impressions:  - Compared to the prior study, there has been no significant interval change.     Radiology/Studies  Dg Chest 2 View  06/22/2014   CLINICAL DATA:  Three-day history of chest pain and exertional shortness of breath. Confusion.  EXAM: CHEST  2 VIEW  COMPARISON:  April 29, 2013  FINDINGS: There is atelectatic change in the right base. The lungs are otherwise clear. Heart is slightly enlarged with pulmonary vascularity within normal limits. Pacemaker leads are attached to the right atrium and right ventricle. No pneumothorax. No adenopathy. No bone lesions.  IMPRESSION: Atelectasis right base. No edema or consolidation. Stable cardiac prominence.   Electronically Signed   By: Bretta Bang III M.D.   On: 06/22/2014 11:26   Ct Head Wo Contrast  06/22/2014   CLINICAL DATA:  Confusion, chest pain beginning 3 days ago a LEFT chest, exertional dyspnea, history hypertension, systolic CHF, stroke, diabetes mellitus, dementia, coronary disease post MI and AICD, smoker  EXAM: CT HEAD WITHOUT CONTRAST  TECHNIQUE: Contiguous axial images were obtained from the base of the skull through the vertex without intravenous contrast.  COMPARISON:  02/10/2013  FINDINGS: Generalized atrophy.  Normal ventricular morphology.  No midline shift or mass effect.  Small vessel chronic ischemic changes of deep cerebral white matter.  Small old infarcts LEFT occipital and at anterior RIGHT basal ganglia.  No intracranial hemorrhage, mass lesion, or acute infarction.  Visualized paranasal sinuses and mastoid air cells clear.   Bones unremarkable.  IMPRESSION: Atrophy with small vessel chronic ischemic changes of deep cerebral white matter.  Small old LEFT occipital and RIGHT basal ganglia infarcts.  No acute intracranial abnormalities.   Electronically Signed   By: Ulyses Southward M.D.   On: 06/22/2014 11:01    ASSESSMENT AND PLAN  74 y.o. year old female with a history of NICM, S-CHF (EF 25% 2015), CVA, DM, and dementia. Her Optivol readings began trending up in late April. Her home Lasix dose was increased from 40 mg bid>>80 mg bid on 06/17  1. Acute on chronic combined systolic and diastolic HF  - EF 15-20%, no RWMA, septal paradox, moderate MR, PA peak pressure  - weight and I/O not consist, weight dropped from 190 to 184 lbs after only - net I/O  - Cr trending up from 1.2 to 1.4 with diuresis, difficult to assess  fluid level, however SOB improving, will try to push for 1 more day of IV diuresis and potentially change to PO lasix tomorrow.   2. NICM s/p ICD 3. H/o PAF 4. HTN 5. DM 6. Dementia 7. Elevated trop: flat at 0.07 x3  Signed, Azalee Course PA-C Pager: [phone]

## 2014-06-23 NOTE — Telephone Encounter (Signed)
Opened in error

## 2014-06-23 NOTE — Telephone Encounter (Signed)
Okay to refill the Symbicort inhaler prescription for 60 days until she obtains a new primary care physician

## 2014-06-23 NOTE — Progress Notes (Signed)
[facility] PROGRESS NOTE  CHRISSA MAHAR RTM:[id number] DOB: 1940/11/06 DOA: 06/22/2014 PCP: [name], MD  Assessment/Plan:  Principal Problem:   Systolic and diastolic CHF, acute on chronic Active Problems:   HTN (hypertension)   Dementia   ICD (implantable cardioverter-defibrillator) in place   Paroxysmal atrial fibrillation   DM (diabetes mellitus), type 2 with renal complications   Chest pain   Chest pain at rest   Hypokalemia   Hypoxia  Continue IV lasix. Monitor lytes. Off [place] oxygen  HPI/Subjective:  Feels better, but not back to baseline  Objective: Filed Vitals:   06/23/14 1300  BP: 106/53  Pulse: 68  Temp: 98.7 F (37.1 C)  Resp: 18    Intake/Output Summary (Last 24 hours) at 06/23/14 1705 Last data filed at 06/23/14 1300  Gross per 24 hour  Intake    775 ml  Output    350 ml  Net    425 ml   Filed Weights   06/22/14 0954 06/22/14 1334 06/23/14 0449  Weight: 86.183 kg (190 lb) 83.28 kg (183 lb 9.6 oz) 83.825 kg (184 lb 12.8 oz)    Exam:   General:  Alert, confused  Cardiovascular: RRR without MGR  Respiratory: CTA without WRR  Abdomen: S, NT, ND  Ext: 1plus edema  Basic Metabolic Panel:  Recent Labs Lab 06/22/14 1018 06/22/14 1924 06/23/14 0126  NA 137  --  135  K 2.8*  --  4.9  CL 104  --  102  CO2 23  --  21*  GLUCOSE 65  --  71  BUN 14  --  18  CREATININE 1.19*  --  1.40*  CALCIUM 8.8*  --  8.7*  MG  --  1.9  --    Liver Function Tests:  Recent Labs Lab 06/22/14 1018  AST 22  ALT 27  ALKPHOS 80  BILITOT 1.3*  PROT 6.2*  ALBUMIN 3.6   No results for input(s): LIPASE, AMYLASE in the last 168 hours. No results for input(s): AMMONIA in the last 168 hours. CBC:  Recent Labs Lab 06/22/14 1018 06/23/14 0250  WBC 5.8 8.0  HGB 11.2* 11.0*  HCT 34.9* 34.4*  MCV 88.8 88.0  PLT 253 249   Cardiac Enzymes:  Recent Labs Lab 06/22/14 1545 06/22/14 1924 06/23/14 0126  TROPONINI 0.07* 0.07*  0.07*   BNP (last 3 results)  Recent Labs  06/22/14 1018  BNP 1855.7*    ProBNP (last 3 results) No results for input(s): PROBNP in the last 8760 hours.  CBG:  Recent Labs Lab 06/22/14 1646 06/22/14 2129 06/23/14 0612 06/23/14 1119 06/23/14 1637  GLUCAP 121* 76 215* 86 115*    No results found for this or any previous visit (from the past 240 hour(s)).   Studies: Dg Chest 2 View  06/22/2014   CLINICAL DATA:  Three-day history of chest pain and exertional shortness of breath. Confusion.  EXAM: CHEST  2 VIEW  COMPARISON:  April 29, 2013  FINDINGS: There is atelectatic change in the right base. The lungs are otherwise clear. Heart is slightly enlarged with pulmonary vascularity within normal limits. Pacemaker leads are attached to the right atrium and right ventricle. No pneumothorax. No adenopathy. No bone lesions.  IMPRESSION: Atelectasis right base. No edema or consolidation. Stable cardiac prominence.   Electronically Signed   By: Bretta Bang III M.D.   On: 06/22/2014 11:26   Ct Head Wo Contrast  06/22/2014   CLINICAL DATA:  Confusion, chest pain beginning  3 days ago a LEFT chest, exertional dyspnea, history hypertension, systolic CHF, stroke, diabetes mellitus, dementia, coronary disease post MI and AICD, smoker  EXAM: CT HEAD WITHOUT CONTRAST  TECHNIQUE: Contiguous axial images were obtained from the base of the skull through the vertex without intravenous contrast.  COMPARISON:  02/10/2013  FINDINGS: Generalized atrophy.  Normal ventricular morphology.  No midline shift or mass effect.  Small vessel chronic ischemic changes of deep cerebral white matter.  Small old infarcts LEFT occipital and at anterior RIGHT basal ganglia.  No intracranial hemorrhage, mass lesion, or acute infarction.  Visualized paranasal sinuses and mastoid air cells clear.  Bones unremarkable.  IMPRESSION: Atrophy with small vessel chronic ischemic changes of deep cerebral white matter.  Small old LEFT  occipital and RIGHT basal ganglia infarcts.  No acute intracranial abnormalities.   Electronically Signed   By: Ulyses Southward M.D.   On: 06/22/2014 11:01    Scheduled Meds: . acetaminophen  650 mg Oral TID  . amLODipine  10 mg Oral QHS  . aspirin  81 mg Oral Daily  . carvedilol  6.25 mg Oral BID WC  . docusate sodium  100 mg Oral BID  . furosemide  40 mg Intravenous BID  . heparin  5,000 Units Subcutaneous 3 times per day  . insulin aspart  0-15 Units Subcutaneous TID WC  . insulin aspart  3 Units Subcutaneous TID WC  . insulin detemir  20 Units Subcutaneous QHS  . isosorbide mononitrate  60 mg Oral Daily  . potassium chloride  40 mEq Oral BID  . sodium chloride  3 mL Intravenous Q12H  . sodium chloride  3 mL Intravenous Q12H   Continuous Infusions:   Time spent: 35 minutes  Giuliano Preece L  [facility] [email or web address], password [facility] 06/23/2014, 5:05 PM  LOS: 1 day

## 2014-06-23 NOTE — Telephone Encounter (Signed)
I called the daughter.  We have no record of the patient taking any kind of inhaler.  I spoke with Dr Allyson Sabal and we cannot "refill" something that we have no record of her taking. I explained that to the patient's daughter and she voiced understanding.

## 2014-06-24 LAB — BASIC METABOLIC PANEL
ANION GAP: 7 (ref 5–15)
BUN: 17 mg/dL (ref 6–20)
CALCIUM: 9.5 mg/dL (ref 8.9–10.3)
CO2: 22 mmol/L (ref 22–32)
CREATININE: 1.22 mg/dL — AB (ref 0.44–1.00)
Chloride: 103 mmol/L (ref 101–111)
GFR calc Af Amer: 49 mL/min — ABNORMAL LOW (ref >60)
GFR calc non Af Amer: 43 mL/min — ABNORMAL LOW (ref >60)
Glucose, Bld: 144 mg/dL — ABNORMAL HIGH (ref 65–99)
Potassium: 5.1 mmol/L (ref 3.5–5.1)
SODIUM: 132 mmol/L — AB (ref 135–145)

## 2014-06-24 LAB — GLUCOSE, CAPILLARY
GLUCOSE-CAPILLARY: 149 mg/dL — AB (ref 65–99)
GLUCOSE-CAPILLARY: 105 mg/dL — AB (ref 65–99)
Glucose-Capillary: 140 mg/dL — ABNORMAL HIGH (ref 65–99)
Glucose-Capillary: 98 mg/dL (ref 65–99)
Glucose-Capillary: 107 mg/dL — ABNORMAL HIGH (ref 65–99)

## 2014-06-24 MED ORDER — FUROSEMIDE 10 MG/ML IJ SOLN
80.0000 mg | Freq: Two times a day (BID) | INTRAMUSCULAR | Status: AC
  Administered 2014-06-24: 80 mg via INTRAVENOUS

## 2014-06-24 MED ORDER — CARVEDILOL 6.25 MG PO TABS
6.2500 mg | ORAL_TABLET | Freq: Two times a day (BID) | ORAL | Status: DC
  Administered 2014-06-25 – 2014-06-28 (×7): 6.25 mg via ORAL
  Filled 2014-06-24 (×10): qty 1

## 2014-06-24 NOTE — Care Management Note (Signed)
Case Management Note  Patient Details  Name: Paula [name] MRN: [id number] Date of Birth: 1940-01-07  Subjective/Objective:       Admitted with CHF             Action/Plan: Talked to patient about DCP; she requested that I talk to her daughter Paula [name] [phone]). Paula [name] called and talked to for DCP; Patient was active with [facility] for [facility] and request to use their services again. Patient also has a personal care aide that comes to her home every. Patient could benefit from the disease management program with [facility]. Otelia Santee with [facility] called for arrangements. Paula [name] also stated that her PCP - Paula Flake, MD ; is no longer seeing her because he left the Urgent Care practice at [facility]. The patient has been seeing another MD at Urgent Care until she can become established with another physician. Paula [name] has not decided who she wants her to see at this time but is making calls to see which physician is accepting Medicare patients. Urgent Care at [facility]  [phone] called. Annie Sable NP is seeing all of Dr Margretta Ditty patients at this time. Pharmacy of choice is [facility]- no problems getting medication at this time.   Expected Discharge Date:    possibly 06/25/2014 Expected Discharge Plan:  Home w Home Health Services   Discharge planning Services  CM Consult     Choice offered to:  Adult Children     HH Arranged:  RN, Disease Management, PT, Nurse's Aide HH Agency:  [facility]  Status of Service:  In process, will continue to follow   [name] [phone] 06/24/2014, 3:15 PM

## 2014-06-24 NOTE — Progress Notes (Signed)
Patient Name: Paula [name] Date of Encounter: 06/24/2014  Primary Cardiologist Dr Allyson Sabal, ICD (with Optivol): Dr Royann Shivers   Principal Problem:   Systolic and diastolic CHF, acute on chronic Active Problems:   HTN (hypertension)   Dementia   ICD (implantable cardioverter-defibrillator) in place   Paroxysmal atrial fibrillation   DM (diabetes mellitus), type 2 with renal complications   Chest pain   Chest pain at rest   Hypokalemia   Hypoxia    SUBJECTIVE  Feeling better. Denies any CP or SOB.   CURRENT MEDS . acetaminophen  650 mg Oral TID  . amLODipine  10 mg Oral QHS  . aspirin  81 mg Oral Daily  . carvedilol  6.25 mg Oral BID WC  . docusate sodium  100 mg Oral BID  . furosemide  40 mg Intravenous BID  . heparin  5,000 Units Subcutaneous 3 times per day  . insulin aspart  0-15 Units Subcutaneous TID WC  . insulin aspart  3 Units Subcutaneous TID WC  . insulin detemir  20 Units Subcutaneous QHS  . isosorbide mononitrate  60 mg Oral Daily  . sodium chloride  3 mL Intravenous Q12H  . sodium chloride  3 mL Intravenous Q12H    OBJECTIVE  Filed Vitals:   06/23/14 1300 06/23/14 2026 06/24/14 0522 06/24/14 0951  BP: 106/53 114/82 131/79 120/68  Pulse: 68 73 85 64  Temp: 98.7 F (37.1 C) 98.6 F (37 C) 98 F (36.7 C)   TempSrc: Oral Oral Oral   Resp: Height:      Weight:   186 lb 14.4 oz (84.777 kg)   SpO2: 100% 100% 99% 100%    Intake/Output Summary (Last 24 hours) at 06/24/14 1015 Last data filed at 06/24/14 0950  Gross per 24 hour  Intake    960 ml  Output   1303 ml  Net   -343 ml   Filed Weights   06/22/14 1334 06/23/14 0449 06/24/14 0522  Weight: 183 lb 9.6 oz (83.28 kg) 184 lb 12.8 oz (83.825 kg) 186 lb 14.4 oz (84.777 kg)    PHYSICAL EXAM  General: Pleasant, NAD. Neuro: Alert and oriented X 3. Moves all extremities spontaneously. Psych: Normal affect. HEENT:  Normal  Neck: Supple without bruits or JVD. Lungs:  Resp regular  and unlabored, CTA. Heart: RRR no s3, s4, or murmurs. Abdomen: Soft, non-tender, non-distended, BS + x 4.  Extremities: No clubbing, cyanosis. DP/PT/Radials 2+ and equal bilaterally. Nonpitting edema in LE with fatty tissue  Accessory Clinical Findings  CBC  Recent Labs  06/22/14 1018 06/23/14 0250  WBC 5.8 8.0  HGB 11.2* 11.0*  HCT 34.9* 34.4*  MCV 88.8 88.0  PLT 253 249   Basic Metabolic Panel  Recent Labs  06/22/14 1924 06/23/14 0126 06/24/14 0401  NA  --  135 132*  K  --  4.9 5.1  CL  --  102 103  CO2  --  21* 22  GLUCOSE  --  71 144*  BUN  --  18 17  CREATININE  --  1.40* 1.22*  CALCIUM  --  8.7* 9.5  MG 1.9  --   --    Liver Function Tests  Recent Labs  06/22/14 1018  AST 22  ALT 27  ALKPHOS 80  BILITOT 1.3*  PROT 6.2*  ALBUMIN 3.6   Cardiac Enzymes  Recent Labs  06/22/14 1545 06/22/14 1924 06/23/14 0126  TROPONINI 0.07* 0.07* 0.07*  Hemoglobin A1C  Recent Labs  06/22/14 1545  HGBA1C 7.1*   Thyroid Function Tests  Recent Labs  06/22/14 1545  TSH 1.857    TELE NSR with PVCs    ECG  No new EKG  Echocardiogram  LV EF: 15% -  20%  ------------------------------------------------------------------- Indications:   Dyspnea 786.09.  ------------------------------------------------------------------- History:  PMH:  Congestive heart failure. PMH:  Myocardial infarction. Risk factors: Hypertension. Diabetes mellitus. Dyslipidemia.  ------------------------------------------------------------------- Study Conclusions  - Left ventricle: The cavity size was mildly dilated. There was moderate concentric hypertrophy. Systolic function was severely reduced. The estimated ejection fraction was in the range of 15% to 20%. Wall motion was normal; there were no regional wall motion abnormalities. - Ventricular septum: Septal motion showed paradox. - Mitral valve: There was moderate regurgitation. - Left atrium:  The atrium was severely dilated. - Right ventricle: Systolic function was mildly reduced. - Right atrium: The atrium was moderately dilated. - Pulmonary arteries: Systolic pressure was moderately increased. PA peak pressure: 45 mm Hg (S).  Impressions:  - Compared to the prior study, there has been no significant interval change.    Radiology/Studies  Dg Chest 2 View  06/22/2014   CLINICAL DATA:  Three-day history of chest pain and exertional shortness of breath. Confusion.  EXAM: CHEST  2 VIEW  COMPARISON:  April 29, 2013  FINDINGS: There is atelectatic change in the right base. The lungs are otherwise clear. Heart is slightly enlarged with pulmonary vascularity within normal limits. Pacemaker leads are attached to the right atrium and right ventricle. No pneumothorax. No adenopathy. No bone lesions.  IMPRESSION: Atelectasis right base. No edema or consolidation. Stable cardiac prominence.   Electronically Signed   By: William  Woodruff III M.D.   On: 06/22/2014 11:26   Ct Head Wo Contrast  06/22/2014   CLINICAL DATA:  Confusion, chest pain beginning 3 days ago a LEFT chest, exertional dyspnea, history hypertension, systolic CHF, stroke, diabetes mellitus, dementia, coronary disease post MI and AICD, smoker  EXAM: CT HEAD WITHOUT CONTRAST  TECHNIQUE: Contiguous axial images were obtained from the base of the skull through the vertex without intravenous contrast.  COMPARISON:  02/10/2013  FINDINGS: Generalized atrophy.  Normal ventricular morphology.  No midline shift or mass effect.  Small vessel chronic ischemic changes of deep cerebral white matter.  Small old infarcts LEFT occipital and at anterior RIGHT basal ganglia.  No intracranial hemorrhage, mass lesion, or acute infarction.  Visualized paranasal sinuses and mastoid air cells clear.  Bones unremarkable.  IMPRESSION: Atrophy with small vessel chronic ischemic changes of deep cerebral white matter.  Small old LEFT occipital and RIGHT  basal ganglia infarcts.  No acute intracranial abnormalities.   Electronically Signed   By: Mark  Boles M.D.   On: 06/22/2014 11:01    ASSESSMENT AND PLAN  74 y.o. year old female with a history of NICM, S-CHF (EF 25% 2015), CVA, DM, and dementia. Her Optivol readings began trending up in late April. Her home Lasix dose was increased from 40 mg bid>>80 mg bid on 06/17  1. Acute on chronic combined systolic and diastolic HF - Echo 06/22/2014 EF 15-20%, no RWMA, septal paradox, moderate MR, PA peak pressure <[id number] SeRenMaryland [phone] Pip 2 BosRenBay [phone] Pip 900 BirchwoRenLandma6507608Michelle Piper873073rg 65 EaRenHca Houston [id number] Pip 702 Shub FarmRenIntegris Bass [phone] Piper0-3937ealth Centersist, 919 PhilmRenHeart [phone] Pip [address] WilRenRchp-[phone] Piper1-0579 [employer]190 to 184 lbs afte 7463 RoberRenThe Surgery Center At Self Memo863-4728Michelle Pip 276 [address] DRenChildren443 17788Michelle Pip 564 RidgewRenEllsworth [phone] Piper7-1439pal H 87 AdRenThree [phone] Piper4-1263dical CenterMEASUP 7169 CottRenPam Specialty [id number] Piper3-3349l Of 48 BirchwRenOrthopaedic Ambulatory Surgical [phone] Piper4-3232ion ServicesMEASUPaulSu <MEASUPaulSu <MEASUPaulSu <MEASUPaulSu <MEASUPaulSu Hiltralyn Corwinlantable with IV diuresis. Lung clear, SOB resolved, likely can switch to 60mg  BID PO lasix either today or tomorrow prior to discharge. Will send staff message to scheduler at  [facility] to arrange followup.   2. NICM s/p ICD  3. H/o PAF  4. HTN  5. DM  6. Dementia  7. Elevated trop: flat at 0.07 x3, likely demand ischemia  Signed, Amedeo Plenty Pager: [phone]

## 2014-06-24 NOTE — Consult Note (Signed)
   [facility] CM Inpatient Consult   06/24/2014  Paula [name] 29-Feb-1940 [id number] Referral received.  Patient was resting and attempted to speak with her regarding possible care management needs.  Patient states, "I'm jittery and I feel [moving her hands] gesture for 'so-so'.  Notified nurse.  Will follow up with patient and family for determining any needs. Charlesetta Shanks, RN BSN CCM [facility]  [phone] business mobile phone

## 2014-06-24 NOTE — Progress Notes (Signed)
[facility] PROGRESS NOTE  AYAAT JANSMA ZOX:[id number] DOB: 09-Jun-1940 DOA: 06/22/2014 PCP: [name], MD  Assessment/Plan:  Principal Problem:   Systolic and diastolic CHF, acute on chronic: still dyspneic and  Edematous. Cardiology increasing IV lasix Active Problems:   HTN (hypertension)   Dementia   ICD (implantable cardioverter-defibrillator) in place   Paroxysmal atrial fibrillation   DM (diabetes mellitus), type 2 with renal complications   Hypokalemia correcte   Hypoxia  HPI/Subjective:  Still dyspneic and edematous  Objective: Filed Vitals:   06/24/14 1526  BP: 126/77  Pulse: 70  Temp: 97.5 F (36.4 C)  Resp:     Intake/Output Summary (Last 24 hours) at 06/24/14 1544 Last data filed at 06/24/14 0950  Gross per 24 hour  Intake    720 ml  Output    901 ml  Net   -181 ml   Filed Weights   06/22/14 1334 06/23/14 0449 06/24/14 0522  Weight: 83.28 kg (183 lb 9.6 oz) 83.825 kg (184 lb 12.8 oz) 84.777 kg (186 lb 14.4 oz)    Exam:   General:  Alert, confused. Seems winded with talking  Cardiovascular: RRR without MGR  Respiratory: CTA without WRR  Abdomen: S, NT, ND  Ext: 1plus edema  Basic Metabolic Panel:  Recent Labs Lab 06/22/14 1018 06/22/14 1924 06/23/14 0126 06/24/14 0401  NA 137  --  135 132*  K 2.8*  --  4.9 5.1  CL 104  --  102 103  CO2 23  --  21* 22  GLUCOSE 65  --  71 144*  BUN 14  --  18 17  CREATININE 1.19*  --  1.40* 1.22*  CALCIUM 8.8*  --  8.7* 9.5  MG  --  1.9  --   --    Liver Function Tests:  Recent Labs Lab 06/22/14 1018  AST 22  ALT 27  ALKPHOS 80  BILITOT 1.3*  PROT 6.2*  ALBUMIN 3.6   No results for input(s): LIPASE, AMYLASE in the last 168 hours. No results for input(s): AMMONIA in the last 168 hours. CBC:  Recent Labs Lab 06/22/14 1018 06/23/14 0250  WBC 5.8 8.0  HGB 11.2* 11.0*  HCT 34.9* 34.4*  MCV 88.8 88.0  PLT 253 249   Cardiac Enzymes:  Recent Labs Lab  06/22/14 1545 06/22/14 1924 06/23/14 0126  TROPONINI 0.07* 0.07* 0.07*   BNP (last 3 results)  Recent Labs  06/22/14 1018  BNP 1855.7*    ProBNP (last 3 results) No results for input(s): PROBNP in the last 8760 hours.  CBG:  Recent Labs Lab 06/23/14 1637 06/23/14 2110 06/24/14 0702 06/24/14 1135 06/24/14 1340  GLUCAP 115* 109* 149* 105* 140*    No results found for this or any previous visit (from the past 240 hour(s)).   Studies: No results found.  Scheduled Meds: . acetaminophen  650 mg Oral TID  . amLODipine  10 mg Oral QHS  . aspirin  81 mg Oral Daily  . carvedilol  6.25 mg Oral BID WC  . docusate sodium  100 mg Oral BID  . furosemide  80 mg Intravenous BID  . heparin  5,000 Units Subcutaneous 3 times per day  . insulin aspart  0-15 Units Subcutaneous TID WC  . insulin aspart  3 Units Subcutaneous TID WC  . insulin detemir  20 Units Subcutaneous QHS  . isosorbide mononitrate  60 mg Oral Daily  . sodium chloride  3 mL Intravenous Q12H  . sodium chloride  3 mL Intravenous Q12H   Continuous Infusions:   Time spent: 35 minutes  Raji Glinski L  [facility] [email or web address], password [facility] 06/24/2014, 3:44 PM  LOS: 2 days

## 2014-06-24 NOTE — Progress Notes (Signed)
Occupational Therapy Evaluation Patient Details Name: Paula [name] MRN: [id number] DOB: 27-May-1940 Today's Date: 06/24/2014    History of Present Illness Paula [name] is a 74 y.o. female with PMH significant for Systolic CHF, hypertension, stroke, DM2, ICD and MI who presented to the ED with shortness of breath and left-sided chest pain that has been intermittent and increasing in severity over the past three days.   Clinical Impression   PTA, pt lived at home and had PCA assist daily. Pt appears close to functional baseline regarding ADL. Feel pt will be able to D/C home with 24/7 /s when medically stable. OT signing off.      Follow Up Recommendations  No OT follow up;Supervision/Assistance - 24 hour    Equipment Recommendations  None recommended by OT    Recommendations for Other Services       Precautions / Restrictions Precautions Precautions: Fall      Mobility Bed Mobility Overal bed mobility: Needs Assistance Bed Mobility: Supine to Sit;Sit to Supine     Supine to sit: Supervision Sit to supine: Supervision      Transfers Overall transfer level: Needs assistance   Transfers: Sit to/from Stand;Stand Pivot Transfers Sit to Stand: Supervision Stand pivot transfers: Supervision            Balance             Standing balance-Leahy Scale: Fair Standing balance comment: Pt able to transfer to toilet and copmlete hygiene with S                            ADL Overall ADL's : At baseline                            Completing bathing/dressing with S           General ADL Comments: Pt reports that she has a PCA from 8-4 daily     Vision     Perception     Praxis      Pertinent Vitals/Pain Pain Assessment: No/denies pain     Hand Dominance     Extremity/Trunk Assessment Upper Extremity Assessment Upper Extremity Assessment: Overall WFL for tasks assessed   Lower Extremity Assessment Lower Extremity  Assessment: Defer to PT evaluation   Cervical / Trunk Assessment Cervical / Trunk Assessment: Normal   Communication Communication Communication: No difficulties   Cognition Arousal/Alertness: Awake/alert Behavior During Therapy: WFL for tasks assessed/performed Overall Cognitive Status: No family/caregiver present to determine baseline cognitive functioning                     General Comments       Exercises       Shoulder Instructions      Home Living Family/patient expects to be discharged to:: Private residence Living Arrangements: Children Available Help at Discharge: Family;Personal care attendant;Available 24 hours/day;Available PRN/intermittently Type of Home: House Home Access: Stairs to enter [employer] of Steps: 3 Entrance Stairs-Rails: None Home Layout: One level     Bathroom Shower/Tub: Tub/shower unit Shower/tub characteristics: Engineer, building services: Standard Bathroom Accessibility: Yes How Accessible: Accessible via walker Home Equipment: Walker - 2 wheels;Bedside commode;Shower seat          Prior Functioning/Environment Level of Independence: Independent with assistive device(s)  Gait / Transfers Assistance Needed: RW for all gait ADL's / Homemaking Assistance Needed: pt states she bathes and  dresses herself        OT Diagnosis: Generalized weakness   OT Problem List: Decreased activity tolerance   OT Treatment/Interventions:      OT Goals(Current goals can be found in the care plan section) Acute Rehab OT Goals Patient Stated Goal: TO GO HOME OT Goal Formulation: All assessment and education complete, DC therapy  OT Frequency:     Barriers to D/C:            Co-evaluation              End of Session Nurse Communication: Mobility status  Activity Tolerance: Patient tolerated treatment well Patient left: in bed;with call bell/phone within reach;with bed alarm set   Time: 5750-5183 OT Time  Calculation (min): 18 min Charges:  OT General Charges $OT Visit: 1 Procedure OT Evaluation $Initial OT Evaluation Tier I: 1 Procedure G-Codes:    Dejion Grillo,HILLARY 23-Jul-2014, 6:49 PM   [facility], OTR/L  [phone] 2014-07-23

## 2014-06-25 ENCOUNTER — Encounter: Payer: Medicare Other | Admitting: *Deleted

## 2014-06-25 DIAGNOSIS — E1122 Type 2 diabetes mellitus with diabetic chronic kidney disease: Secondary | ICD-10-CM

## 2014-06-25 DIAGNOSIS — N189 Chronic kidney disease, unspecified: Secondary | ICD-10-CM

## 2014-06-25 LAB — BASIC METABOLIC PANEL
Anion gap: 9 (ref 5–15)
BUN: 17 mg/dL (ref 6–20)
CALCIUM: 9.3 mg/dL (ref 8.9–10.3)
CO2: 21 mmol/L — ABNORMAL LOW (ref 22–32)
CREATININE: 1.15 mg/dL — AB (ref 0.44–1.00)
Chloride: 102 mmol/L (ref 101–111)
GFR calc Af Amer: 53 mL/min — ABNORMAL LOW (ref >60)
GFR calc non Af Amer: 46 mL/min — ABNORMAL LOW (ref >60)
GLUCOSE: 67 mg/dL (ref 65–99)
Potassium: 4.5 mmol/L (ref 3.5–5.1)
Sodium: 132 mmol/L — ABNORMAL LOW (ref 135–145)

## 2014-06-25 LAB — GLUCOSE, CAPILLARY
GLUCOSE-CAPILLARY: 156 mg/dL — AB (ref 65–99)
GLUCOSE-CAPILLARY: 99 mg/dL (ref 65–99)
Glucose-Capillary: 79 mg/dL (ref 65–99)
Glucose-Capillary: 130 mg/dL — ABNORMAL HIGH (ref 65–99)

## 2014-06-25 LAB — URINE CULTURE

## 2014-06-25 MED ORDER — FUROSEMIDE 10 MG/ML IJ SOLN
80.0000 mg | Freq: Two times a day (BID) | INTRAMUSCULAR | Status: AC
  Administered 2014-06-25 – 2014-06-26 (×2): 80 mg via INTRAVENOUS

## 2014-06-25 MED ORDER — INSULIN DETEMIR 100 UNIT/ML ~~LOC~~ SOLN
17.0000 [IU] | Freq: Every day | SUBCUTANEOUS | Status: DC
  Administered 2014-06-25 – 2014-06-27 (×3): 17 [IU] via SUBCUTANEOUS
  Filled 2014-06-25 (×5): qty 0.17

## 2014-06-25 NOTE — Progress Notes (Signed)
    Subjective: She feels better.  No SOB.  Ankle swelling a lot better  Objective: Vital signs in last 24 hours: Temp:  [97.5 F (36.4 C)-98.2 F (36.8 C)] 98.2 F (36.8 C) (06/24 0928) Pulse Rate:  [64-70] 64 (06/24 0928) Resp:  [18] 18 (06/24 0928) BP: (121-132)/(77-91) 130/88 mmHg (06/24 0928) SpO2:  [100 %] 100 % (06/24 0928) Weight:  [184 lb 4.8 oz (83.598 kg)] 184 lb 4.8 oz (83.598 kg) (06/24 0458) Last BM Date: 06/24/14  Intake/Output from previous day: 06/23 0701 - 06/24 0700 In: 1200 [P.O.:1200] Out: 1500 [Urine:1500] Intake/Output this shift: Total I/O In: 580 [P.O.:580] Out: -   Medications Scheduled Meds: . acetaminophen  650 mg Oral TID  . amLODipine  10 mg Oral QHS  . aspirin  81 mg Oral Daily  . carvedilol  6.25 mg Oral BID WC  . docusate sodium  100 mg Oral BID  . heparin  5,000 Units Subcutaneous 3 times per day  . insulin aspart  0-15 Units Subcutaneous TID WC  . insulin aspart  3 Units Subcutaneous TID WC  . insulin detemir  20 Units Subcutaneous QHS  . isosorbide mononitrate  60 mg Oral Daily  . sodium chloride  3 mL Intravenous Q12H  . sodium chloride  3 mL Intravenous Q12H   Continuous Infusions:  PRN Meds:.sodium chloride, acetaminophen **OR** acetaminophen, ALPRAZolam, bisacodyl, loratadine, nitroGLYCERIN, ondansetron **OR** ondansetron (ZOFRAN) IV, polyethylene glycol, sodium chloride  PE: General appearance: alert, cooperative and no distress Neck: JVD elevated Lungs: clear to auscultation bilaterally Heart: regular rate and rhythm, S1, S2 normal, no murmur, click, rub or gallop Abdomen: +BS, soft, nontender Extremities: No LEE Pulses: 2+ and symmetric Skin: Warm and dry Neurologic: Grossly normal  Lab Results:   Recent Labs  06/23/14 0250  WBC 8.0  HGB 11.0*  HCT 34.4*  PLT 249   BMET  Recent Labs  06/23/14 0126 06/24/14 0401 06/25/14 0830  NA 135 132* 132*  K 4.9 5.1 4.5  CL 102 103 102  CO2 21* 22 21*  GLUCOSE  71 144* 67  BUN 18 17 17   CREATININE 1.40* 1.22* 1.15*  CALCIUM 8.7* 9.5 9.3     Assessment/Plan 74 y.o. year old female with a history of NICM, S-CHF (EF 25% 2015), CVA, DM, and dementia. Her Optivol readings began trending up in late April. Her home Lasix dose was increased from 40 mg bid>>80 mg bid on 06/17  1. Acute on chronic combined systolic and diastolic HF Net fluids:  -0.3L/-1.0L.  JVD still elevted.  Continue IV lasix.   Echo 06/22/2014 EF 15-20%, no RWMA, septal paradox, moderate MR, PA peak pressure - weight and I/O not consist, weight dropped from 190 to 184 lbs  - SCr stable   2. NICM s/p ICD  3. H/o PAF  Sinus rhythm 4. HTN  Stable, controlled 5. DM  6. Dementia  7. Elevated trop: flat at 0.07 x3, likely demand ischemia   LOS: 3 days    Tosha Belgarde PA-C 06/25/2014 12:38 PM

## 2014-06-25 NOTE — Patient Outreach (Signed)
Follow up with patient's daughter per patient's request.  Call placed to [facility] @ [phone].  HIPPA verified.  Daughter states that she is interested in [facility] as a follow up for her mother.  Explained the difference between home health and care management services.  She states that the patient also gets personal care services aide she believes is [facility] [place] from 9 am to 7 pm.  Call back information given to the daughter as the patient states that she signs all of her forms.  Will follow up with daughter for consent, if possible.  For questions, please contact: Charlesetta Shanks, RN BSN CCM [facility]  [phone] business mobile phone

## 2014-06-25 NOTE — Progress Notes (Signed)
PT Cancellation Note  Patient Details Name: Paula [name] MRN: [id number] DOB: 11/03/1940   Cancelled Treatment:    Reason Eval/Treat Not Completed: Pain limiting ability to participate.  Pt indicates Bil ankles too swollen and painful for PT today.  Will f/u another time.     Racquel Arkin, Alison Murray 06/25/2014, 2:58 PM

## 2014-06-25 NOTE — Progress Notes (Signed)
[facility] PROGRESS NOTE  Paula [name] DOB: 1940-11-20 DOA: 06/22/2014 PCP: [name], MD  Assessment/Plan:  Principal Problem:   Systolic and diastolic CHF, acute on chronic: still on IV lasix Active Problems:   HTN (hypertension)   Dementia   ICD (implantable cardioverter-defibrillator) in place   Paroxysmal atrial fibrillation   DM (diabetes mellitus), type 2 with renal complications   Hypokalemia corrected   Hypoxia  HPI/Subjective: Feels a little better  Objective: Filed Vitals:   06/25/14 0928  BP: 130/88  Pulse: 64  Temp: 98.2 F (36.8 C)  Resp: 18    Intake/Output Summary (Last 24 hours) at 06/25/14 1357 Last data filed at 06/25/14 1320  Gross per 24 hour  Intake   1420 ml  Output   1500 ml  Net    -80 ml   Filed Weights   06/23/14 0449 06/24/14 0522 06/25/14 0458  Weight: 83.825 kg (184 lb 12.8 oz) 84.777 kg (186 lb 14.4 oz) 83.598 kg (184 lb 4.8 oz)    Exam:   General:  Alert, confused. More comfortable  Cardiovascular: RRR without MGR  Respiratory: CTA without WRR  Abdomen: S, NT, ND  Ext: 1plus edema  Basic Metabolic Panel:  Recent Labs Lab 06/22/14 1018 06/22/14 1924 06/23/14 0126 06/24/14 0401 06/25/14 0830  NA 137  --  135 132* 132*  K 2.8*  --  4.9 5.1 4.5  CL 104  --  102 103 102  CO2 23  --  21* 22 21*  GLUCOSE 65  --  71 144* 67  BUN 14  --  18 17 17   CREATININE 1.19*  --  1.40* 1.22* 1.15*  CALCIUM 8.8*  --  8.7* 9.5 9.3  MG  --  1.9  --   --   --    Liver Function Tests:  Recent Labs Lab 06/22/14 1018  AST 22  ALT 27  ALKPHOS 80  BILITOT 1.3*  PROT 6.2*  ALBUMIN 3.6   No results for input(s): LIPASE, AMYLASE in the last 168 hours. No results for input(s): AMMONIA in the last 168 hours. CBC:  Recent Labs Lab 06/22/14 1018 06/23/14 0250  WBC 5.8 8.0  HGB 11.2* 11.0*  HCT 34.9* 34.4*  MCV 88.8 88.0  PLT 253 249   Cardiac Enzymes:  Recent Labs Lab 06/22/14 1545  06/22/14 1924 06/23/14 0126  TROPONINI 0.07* 0.07* 0.07*   BNP (last 3 results)  Recent Labs  06/22/14 1018  BNP 1855.7*    ProBNP (last 3 results) No results for input(s): PROBNP in the last 8760 hours.  CBG:  Recent Labs Lab 06/24/14 1340 06/24/14 1708 06/24/14 2034 06/25/14 0611 06/25/14 1123  GLUCAP 140* 98 107* 156* 99    Recent Results (from the past 240 hour(s))  Urine culture     Status: None   Collection Time: 06/24/14  8:00 AM  Result Value Ref Range Status   Specimen Description URINE, CLEAN CATCH  Final   Special Requests NONE  Final   Culture   Final    MULTIPLE SPECIES PRESENT, SUGGEST RECOLLECTION IF CLINICALLY INDICATED   Report Status 06/25/2014 FINAL  Final     Studies: No results found.  Scheduled Meds: . acetaminophen  650 mg Oral TID  . amLODipine  10 mg Oral QHS  . aspirin  81 mg Oral Daily  . carvedilol  6.25 mg Oral BID WC  . docusate sodium  100 mg Oral BID  . heparin  5,000 Units Subcutaneous  3 times per day  . insulin aspart  0-15 Units Subcutaneous TID WC  . insulin aspart  3 Units Subcutaneous TID WC  . insulin detemir  20 Units Subcutaneous QHS  . isosorbide mononitrate  60 mg Oral Daily  . sodium chloride  3 mL Intravenous Q12H  . sodium chloride  3 mL Intravenous Q12H   Continuous Infusions:   Time spent: 35 minutes  Jaleel Allen L  [facility] [email or web address], password [facility] 06/25/2014, 1:57 PM  LOS: 3 days

## 2014-06-26 LAB — BASIC METABOLIC PANEL
Anion gap: 11 (ref 5–15)
BUN: 19 mg/dL (ref 6–20)
CO2: 23 mmol/L (ref 22–32)
CREATININE: 1.15 mg/dL — AB (ref 0.44–1.00)
Calcium: 9.9 mg/dL (ref 8.9–10.3)
Chloride: 101 mmol/L (ref 101–111)
GFR calc Af Amer: 53 mL/min — ABNORMAL LOW (ref >60)
GFR calc non Af Amer: 46 mL/min — ABNORMAL LOW (ref >60)
Glucose, Bld: 88 mg/dL (ref 65–99)
Potassium: 4.1 mmol/L (ref 3.5–5.1)
Sodium: 135 mmol/L (ref 135–145)

## 2014-06-26 LAB — GLUCOSE, CAPILLARY
Glucose-Capillary: 100 mg/dL — ABNORMAL HIGH (ref 65–99)
Glucose-Capillary: 98 mg/dL (ref 65–99)
Glucose-Capillary: 96 mg/dL (ref 65–99)
Glucose-Capillary: 143 mg/dL — ABNORMAL HIGH (ref 65–99)

## 2014-06-26 MED ORDER — LOSARTAN POTASSIUM 25 MG PO TABS
25.0000 mg | ORAL_TABLET | Freq: Every day | ORAL | Status: DC
  Administered 2014-06-26 – 2014-06-28 (×3): 25 mg via ORAL
  Filled 2014-06-26 (×4): qty 1

## 2014-06-26 MED ORDER — INSULIN ASPART 100 UNIT/ML ~~LOC~~ SOLN
3.0000 [IU] | Freq: Three times a day (TID) | SUBCUTANEOUS | Status: DC
  Administered 2014-06-26 – 2014-06-28 (×6): 3 [IU] via SUBCUTANEOUS

## 2014-06-26 NOTE — Progress Notes (Signed)
[facility] PROGRESS NOTE  Paula [name] ZOX:[id number] DOB: [birth year] DOA: 06/22/2014 PCP: [name], MD  Assessment/Plan:  Principal Problem:   Systolic and diastolic CHF, acute on chronic: still on IV lasix. ARB resumed Active Problems:   HTN (hypertension)   Dementia   ICD (implantable cardioverter-defibrillator) in place   Paroxysmal atrial fibrillation   DM (diabetes mellitus), type 2 with renal complications   Hypokalemia corrected   Hypoxia  HPI/Subjective: Breathing easier.  Objective: Filed Vitals:   06/26/14 1405  BP: 115/54  Pulse: 63  Temp: 98.2 F (36.8 C)  Resp: 20    Intake/Output Summary (Last 24 hours) at 06/26/14 1451 Last data filed at 06/26/14 1340  Gross per 24 hour  Intake   1040 ml  Output   2750 ml  Net  -1710 ml   Filed Weights   06/24/14 0522 06/25/14 0458 06/26/14 0526  Weight: 84.777 kg (186 lb 14.4 oz) 83.598 kg (184 lb 4.8 oz) 83.623 kg (184 lb 5.7 oz)    Exam:   General:  Alert, confused. Comfortable   Cardiovascular: RRR without MGR  Respiratory: CTA without WRR  Abdomen: S, NT, ND  Ext: 1plus edema  Basic Metabolic Panel:  Recent Labs Lab 06/22/14 1018 06/22/14 1924 06/23/14 0126 06/24/14 0401 06/25/14 0830 06/26/14 0412  NA 137  --  135 132* 132* 135  K 2.8*  --  4.9 5.1 4.5 4.1  CL 104  --  102 103 102 101  CO2 23  --  21* 22 21* 23  GLUCOSE 65  --  71 144* 67 88  BUN 14  --  CREATININE 1.19*  --  1.40* 1.22* 1.15* 1.15*  CALCIUM 8.8*  --  8.7* 9.5 9.3 9.9  MG  --  1.9  --   --   --   --    Liver Function Tests:  Recent Labs Lab 06/22/14 1018  AST 22  ALT 27  ALKPHOS 80  BILITOT 1.3*  PROT 6.2*  ALBUMIN 3.6   No results for input(s): LIPASE, AMYLASE in the last 168 hours. No results for input(s): AMMONIA in the last 168 hours. CBC:  Recent Labs Lab 06/22/14 1018 06/23/14 0250  WBC 5.8 8.0  HGB 11.2* 11.0*  HCT 34.9* 34.4*  MCV 88.8 88.0  PLT 253 249    Cardiac Enzymes:  Recent Labs Lab 06/22/14 1545 06/22/14 1924 06/23/14 0126  TROPONINI 0.07* 0.07* 0.07*   BNP (last 3 results)  Recent Labs  06/22/14 1018  BNP 1855.7*    ProBNP (last 3 results) No results for input(s): PROBNP in the last 8760 hours.  CBG:  Recent Labs Lab 06/25/14 1123 06/25/14 1643 06/25/14 2040 06/26/14 0605 06/26/14 1111  GLUCAP 99 79 130* 100* 98    Recent Results (from the past 240 hour(s))  Urine culture     Status: None   Collection Time: 06/24/14  8:00 AM  Result Value Ref Range Status   Specimen Description URINE, CLEAN CATCH  Final   Special Requests NONE  Final   Culture   Final    MULTIPLE SPECIES PRESENT, SUGGEST RECOLLECTION IF CLINICALLY INDICATED   Report Status 06/25/2014 FINAL  Final     Studies: No results found.  Scheduled Meds: . acetaminophen  650 mg Oral TID  . amLODipine  10 mg Oral QHS  . aspirin  81 mg Oral Daily  . carvedilol  6.25 mg Oral BID WC  . docusate sodium  100 mg Oral BID  . heparin  5,000 Units Subcutaneous 3 times per day  . insulin aspart  0-15 Units Subcutaneous TID WC  . insulin aspart  3 Units Subcutaneous TID WC  . insulin detemir  17 Units Subcutaneous QHS  . isosorbide mononitrate  60 mg Oral Daily  . losartan  25 mg Oral Daily  . sodium chloride  3 mL Intravenous Q12H  . sodium chloride  3 mL Intravenous Q12H   Continuous Infusions:   Time spent: 35 minutes  Paula [name] L  [facility] [email or web address], password [facility] 06/26/2014, 2:51 PM  LOS: 4 days

## 2014-06-26 NOTE — Progress Notes (Signed)
Patient ID: Paula [name], female   DOB: [birth year], 74 y.o.   MRN: [id number]     Subjective:   SOB improving   Objective:   Temp:  [97.5 F (36.4 C)-97.9 F (36.6 C)] 97.6 F (36.4 C) (06/25 0526) Pulse Rate:  [64-66] 66 (06/25 0526) Resp:  [18-20] 18 (06/25 0526) BP: (121-132)/(65-88) 132/88 mmHg (06/25 0526) SpO2:  [98 %-100 %] 100 % (06/25 0526) Weight:  [184 lb 5.7 oz (83.623 kg)] 184 lb 5.7 oz (83.623 kg) (06/25 0526) Last BM Date: 06/24/14  Filed Weights   06/24/14 0522 06/25/14 0458 06/26/14 0526  Weight: 186 lb 14.4 oz (84.777 kg) 184 lb 4.8 oz (83.598 kg) 184 lb 5.7 oz (83.623 kg)    Intake/Output Summary (Last 24 hours) at 06/26/14 0937 Last data filed at 06/26/14 0926  Gross per 24 hour  Intake   1080 ml  Output   2750 ml  Net  -1670 ml    Telemetry: SR with intermittent pacing  Exam:  General: NAD  Resp: coarse breath sounds bilateral bases  Cardiac: RRR, no m/r/g, JVD mildly elevated with + hepatojuglar reflux  GI: abdomen soft, NT, ND  MSK: 1+ bilateral edema  Neuro: no focal deficits    Lab Results:  Basic Metabolic Panel:  Recent Labs Lab 06/22/14 1924  06/24/14 0401 06/25/14 0830 06/26/14 0412  NA  --   < > 132* 132* 135  K  --   < > 5.1 4.5 4.1  CL  --   < > 103 102 101  CO2  --   < > 22 21* 23  GLUCOSE  --   < > 144* 67 88  BUN  --   < > 17 17 19   CREATININE  --   < > 1.22* 1.15* 1.15*  CALCIUM  --   < > 9.5 9.3 9.9  MG 1.9  --   --   --   --   < > = values in this interval not displayed.  Liver Function Tests:  Recent Labs Lab 06/22/14 1018  AST 22  ALT 27  ALKPHOS 80  BILITOT 1.3*  PROT 6.2*  ALBUMIN 3.6    CBC:  Recent Labs Lab 06/22/14 1018 06/23/14 0250  WBC 5.8 8.0  HGB 11.2* 11.0*  HCT 34.9* 34.4*  MCV 88.8 88.0  PLT 253 249    Cardiac Enzymes:  Recent Labs Lab 06/22/14 1545 06/22/14 1924 06/23/14 0126  TROPONINI 0.07* 0.07* 0.07*    BNP: No results for input(s): PROBNP in the  last 8760 hours.  Coagulation: No results for input(s): INR in the last 168 hours.  ECG:   Medications:   Scheduled Medications: . acetaminophen  650 mg Oral TID  . amLODipine  10 mg Oral QHS  . aspirin  81 mg Oral Daily  . carvedilol  6.25 mg Oral BID WC  . docusate sodium  100 mg Oral BID  . heparin  5,000 Units Subcutaneous 3 times per day  . insulin aspart  0-15 Units Subcutaneous TID WC  . insulin aspart  3 Units Subcutaneous TID WC  . insulin detemir  17 Units Subcutaneous QHS  . isosorbide mononitrate  60 mg Oral Daily  . sodium chloride  3 mL Intravenous Q12H  . sodium chloride  3 mL Intravenous Q12H     Infusions:     PRN Medications:  sodium chloride, acetaminophen **OR** acetaminophen, ALPRAZolam, bisacodyl, loratadine, nitroGLYCERIN, ondansetron **OR** ondansetron (ZOFRAN) IV, polyethylene glycol, sodium chloride  Assessment/Plan   1. Acute on chronic systolic HF - echo 06/2014 LVEF 15-20%, mild RV dysfunction. Diastolic function not described, however decel time and E/A consistent with restrictive function - she is negative 1.5 liters yesterday, negative 2.3 liters since admission. She received lasix IV  once yesterday evening, repeat dose this AM. Will continue to follow I/Os, redose lasix tomorrow based on clinical evaluation. Renal function remains stable - she is on coreg and imdur. She was on losartan  daily  in Jan 2016, it was stopped at 02/08/14 discharge after presenting with AKI in setting of diverticulitis, does not appear restarted since then.  Will restart low dose  daily - still with signs of volume overload, follow after IV lasix today.   2. PAF - has not been on anticoag by her primary cardiologist, from last clinic note concern for her cognitive limitations - continue beta blocker   Dina Rich, M.D.

## 2014-06-27 DIAGNOSIS — F0390 Unspecified dementia without behavioral disturbance: Secondary | ICD-10-CM

## 2014-06-27 LAB — BASIC METABOLIC PANEL
ANION GAP: 10 (ref 5–15)
BUN: 18 mg/dL (ref 6–20)
CALCIUM: 9.7 mg/dL (ref 8.9–10.3)
CO2: 20 mmol/L — ABNORMAL LOW (ref 22–32)
CREATININE: 1.18 mg/dL — AB (ref 0.44–1.00)
Chloride: 104 mmol/L (ref 101–111)
GFR calc Af Amer: 51 mL/min — ABNORMAL LOW (ref >60)
GFR calc non Af Amer: 44 mL/min — ABNORMAL LOW (ref >60)
GLUCOSE: 78 mg/dL (ref 65–99)
Potassium: 4.2 mmol/L (ref 3.5–5.1)
SODIUM: 134 mmol/L — AB (ref 135–145)

## 2014-06-27 LAB — CUP PACEART REMOTE DEVICE CHECK
Battery Voltage: 3.08 V
Brady Statistic AP VP Percent: 0.01 %
Brady Statistic AP VS Percent: 9.5 %
Brady Statistic AS VP Percent: 0.04 %
Brady Statistic AS VS Percent: 90.45 %
Brady Statistic RA Percent Paced: 9.51 %
Date Time Interrogation Session: 20160616162111
HIGH POWER IMPEDANCE MEASURED VALUE: 40 Ohm
HIGH POWER IMPEDANCE MEASURED VALUE: 47 Ohm
HighPow Impedance: 342 Ohm
Lead Channel Pacing Threshold Amplitude: 1 V
Lead Channel Pacing Threshold Pulse Width: 0.4 ms
Lead Channel Sensing Intrinsic Amplitude: 2 mV
Lead Channel Sensing Intrinsic Amplitude: 2 mV
Lead Channel Setting Sensing Sensitivity: 0.3 mV
MDC IDC MSMT LEADCHNL RA IMPEDANCE VALUE: 456 Ohm
MDC IDC MSMT LEADCHNL RV IMPEDANCE VALUE: 456 Ohm
MDC IDC MSMT LEADCHNL RV PACING THRESHOLD AMPLITUDE: 1 V
MDC IDC MSMT LEADCHNL RV PACING THRESHOLD PULSEWIDTH: 0.4 ms
MDC IDC MSMT LEADCHNL RV SENSING INTR AMPL: 31.625 mV
MDC IDC MSMT LEADCHNL RV SENSING INTR AMPL: 31.625 mV
MDC IDC SET LEADCHNL RA PACING AMPLITUDE: 2.25 V
MDC IDC SET LEADCHNL RV PACING AMPLITUDE: 2.5 V
MDC IDC SET LEADCHNL RV PACING PULSEWIDTH: 0.4 ms
MDC IDC SET ZONE DETECTION INTERVAL: 360 ms
MDC IDC STAT BRADY RV PERCENT PACED: 0.05 %
Zone Setting Detection Interval: 300 ms
Zone Setting Detection Interval: 350 ms
Zone Setting Detection Interval: 370 ms

## 2014-06-27 LAB — GLUCOSE, CAPILLARY
Glucose-Capillary: 78 mg/dL (ref 65–99)
Glucose-Capillary: 102 mg/dL — ABNORMAL HIGH (ref 65–99)
Glucose-Capillary: 80 mg/dL (ref 65–99)
Glucose-Capillary: 103 mg/dL — ABNORMAL HIGH (ref 65–99)

## 2014-06-27 MED ORDER — FUROSEMIDE 10 MG/ML IJ SOLN
80.0000 mg | Freq: Once | INTRAMUSCULAR | Status: AC
  Administered 2014-06-27: 80 mg via INTRAVENOUS
  Filled 2014-06-27: qty 8

## 2014-06-27 NOTE — Progress Notes (Signed)
Patient ID: Paula [name], female   DOB: 12-10-1940, 74 y.o.   MRN: [id number]     Subjective:    Some SOB this AM  Objective:   Temp:  [98 F (36.7 C)-98.3 F (36.8 C)] 98.3 F (36.8 C) (06/26 0640) Pulse Rate:  [54-72] 72 (06/26 0640) Resp:  [16-20] 18 (06/26 0640) BP: (110-115)/(54-84) 112/84 mmHg (06/26 0640) SpO2:  [100 %] 100 % (06/26 0640) Weight:  [184 lb 3.5 oz (83.561 kg)] 184 lb 3.5 oz (83.561 kg) (06/26 0640) Last BM Date: 06/24/14  Filed Weights   06/25/14 0458 06/26/14 0526 06/27/14 0640  Weight: 184 lb 4.8 oz (83.598 kg) 184 lb 5.7 oz (83.623 kg) 184 lb 3.5 oz (83.561 kg)    Intake/Output Summary (Last 24 hours) at 06/27/14 0914 Last data filed at 06/27/14 0852  Gross per 24 hour  Intake   1740 ml  Output   2250 ml  Net   -510 ml    Telemetry: SR  Exam:  General: NAD  Resp: faint crackles bilateral bases  Cardiac: RRR, no m/r/g, nO JVD  GI: abdomen soft, NT, ND  MSK: trace bilateral LE edema  Neuro: no focal deficits   Lab Results:  Basic Metabolic Panel:  Recent Labs Lab 06/22/14 1924  06/25/14 0830 06/26/14 0412 06/27/14 0311  NA  --   < > 132* 135 134*  K  --   < > 4.5 4.1 4.2  CL  --   < > 102 101 104  CO2  --   < > 21* 23 20*  GLUCOSE  --   < > 67 88 78  BUN  --   < > 17 19 18   CREATININE  --   < > 1.15* 1.15* 1.18*  CALCIUM  --   < > 9.3 9.9 9.7  MG 1.9  --   --   --   --   < > = values in this interval not displayed.  Liver Function Tests:  Recent Labs Lab 06/22/14 1018  AST 22  ALT 27  ALKPHOS 80  BILITOT 1.3*  PROT 6.2*  ALBUMIN 3.6    CBC:  Recent Labs Lab 06/22/14 1018 06/23/14 0250  WBC 5.8 8.0  HGB 11.2* 11.0*  HCT 34.9* 34.4*  MCV 88.8 88.0  PLT 253 249    Cardiac Enzymes:  Recent Labs Lab 06/22/14 1545 06/22/14 1924 06/23/14 0126  TROPONINI 0.07* 0.07* 0.07*    BNP: No results for input(s): PROBNP in the last 8760 hours.  Coagulation: No results for input(s): INR in the last  168 hours.  ECG:   Medications:   Scheduled Medications: . acetaminophen  650 mg Oral TID  . amLODipine  10 mg Oral QHS  . aspirin  81 mg Oral Daily  . carvedilol  6.25 mg Oral BID WC  . docusate sodium  100 mg Oral BID  . heparin  5,000 Units Subcutaneous 3 times per day  . insulin aspart  0-15 Units Subcutaneous TID WC  . insulin aspart  3 Units Subcutaneous TID WC  . insulin detemir  17 Units Subcutaneous QHS  . isosorbide mononitrate  60 mg Oral Daily  . losartan  25 mg Oral Daily  . sodium chloride  3 mL Intravenous Q12H  . sodium chloride  3 mL Intravenous Q12H     Infusions:     PRN Medications:  sodium chloride, acetaminophen **OR** acetaminophen, ALPRAZolam, bisacodyl, loratadine, nitroGLYCERIN, ondansetron **OR** ondansetron (ZOFRAN) IV, polyethylene glycol, sodium  chloride     Assessment/Plan    1. Acute on chronic systolic HF - echo 06/2014 LVEF 15-20%, mild RV dysfunction. Diastolic function not described, however decel time and E/A consistent with restrictive function - she is negative 750 mL yesterday, negative 3 liters since admission. She received lasix IV  once yesterday, slight uptrend in Cr.  - she is on coreg and imdur. She was on losartan  daily in Jan 2016, it was stopped at 02/08/14 discharge after presenting with AKI in setting of diverticulitis, does not appear restarted since then. Will restart low dose  daily - still with signs of volume overload, still with some SOB this AM. Redose lasix  IV x 1 today, likely change to orals tomorrow.    2. PAF - has not been on anticoag by her primary cardiologist, from last clinic note concern for her cognitive limitations - continue beta blocker       Dina Rich, M.D.

## 2014-06-27 NOTE — Progress Notes (Signed)
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 ARB resumed. Cardiology managing Active Problems:   HTN (hypertension)   Dementia   ICD (implantable cardioverter-defibrillator) in place   Paroxysmal atrial fibrillation   DM (diabetes mellitus), type 2 with renal complications   Hypokalemia corrected AKI: improved with treatment of CHF  HPI/Subjective: Breathing easier.  Objective: Filed Vitals:   06/27/14 1344  BP: 118/69  Pulse: 61  Temp: 98.4 F (36.9 C)  Resp: 20    Intake/Output Summary (Last 24 hours) at 06/27/14 1528 Last data filed at 06/27/14 1343  Gross per 24 hour  Intake   1200 ml  Output   2350 ml  Net  -1150 ml   Filed Weights   06/25/14 0458 06/26/14 0526 06/27/14 0640  Weight: 83.598 kg (184 lb 4.8 oz) 83.623 kg (184 lb 5.7 oz) 83.561 kg (184 lb 3.5 oz)    Exam:   General:  Alert, confused. Comfortable   Cardiovascular: RRR without MGR  Respiratory: CTA without WRR  Abdomen: S, NT, ND  Ext: trace edema  Basic Metabolic Panel:  Recent Labs Lab 06/22/14 1924 06/23/14 0126 06/24/14 0401 06/25/14 0830 06/26/14 0412 06/27/14 0311  NA  --  135 132* 132* 135 134*  K  --  4.9 5.1 4.5 4.1 4.2  CL  --  102 103 102 101 104  CO2  --  21* 22 21* 23 20*  GLUCOSE  --  71 144* 67 88 78  BUN  --  18 17 17 19 18   CREATININE  --  1.40* 1.22* 1.15* 1.15* 1.18*  CALCIUM  --  8.7* 9.5 9.3 9.9 9.7  MG 1.9  --   --   --   --   --    Liver Function Tests:  Recent Labs Lab 06/22/14 1018  AST 22  ALT 27  ALKPHOS 80  BILITOT 1.3*  PROT 6.2*  ALBUMIN 3.6   No results for input(s): LIPASE, AMYLASE in the last 168 hours. No results for input(s): AMMONIA in the last 168  hours. CBC:  Recent Labs Lab 06/22/14 1018 06/23/14 0250  WBC 5.8 8.0  HGB 11.2* 11.0*  HCT 34.9* 34.4*  MCV 88.8 88.0  PLT 253 249   Cardiac Enzymes:  Recent Labs Lab 06/22/14 1545 06/22/14 1924 06/23/14 0126  TROPONINI 0.07* 0.07* 0.07*   BNP (last 3 results)  Recent Labs  06/22/14 1018  BNP 1855.7*    ProBNP (last 3 results) No results for input(s): PROBNP in the last 8760 hours.  CBG:  Recent Labs Lab 06/26/14 1111 06/26/14 1621 06/26/14 2101 06/27/14 0644 06/27/14 1115  GLUCAP 98 96 143* 78 102*    Recent Results (from the past 240 hour(s))  Urine culture     Status: None   Collection Time: 06/24/14  8:00 AM  Result Value Ref Range Status   Specimen Description URINE, CLEAN CATCH  Final   Special Requests NONE  Final   Culture   Final    MULTIPLE SPECIES PRESENT, SUGGEST RECOLLECTION IF CLINICALLY INDICATED   Report Status 06/25/2014 FINAL  Final     Studies: No results found.  Scheduled Meds: . acetaminophen  650 mg Oral TID  . amLODipine  10 mg Oral QHS  .  aspirin  81 mg Oral Daily  . carvedilol  6.25 mg Oral BID WC  . docusate sodium  100 mg Oral BID  . heparin  5,000 Units Subcutaneous 3 times per day  . insulin aspart  0-15 Units Subcutaneous TID WC  . insulin aspart  3 Units Subcutaneous TID WC  . insulin detemir  17 Units Subcutaneous QHS  . isosorbide mononitrate  60 mg Oral Daily  . losartan  25 mg Oral Daily  . sodium chloride  3 mL Intravenous Q12H  . sodium chloride  3 mL Intravenous Q12H   Continuous Infusions:   Time spent: 35 minutes  Sherika Kubicki L  [facility] [email or web address], password [facility] 06/27/2014, 3:28 PM  LOS: 5 days

## 2014-06-28 ENCOUNTER — Telehealth: Payer: Self-pay | Admitting: Cardiovascular Disease

## 2014-06-28 LAB — BASIC METABOLIC PANEL
Anion gap: 7 (ref 5–15)
BUN: 15 mg/dL (ref 6–20)
CO2: 23 mmol/L (ref 22–32)
Calcium: 9.9 mg/dL (ref 8.9–10.3)
Chloride: 104 mmol/L (ref 101–111)
Creatinine, Ser: 1.1 mg/dL — ABNORMAL HIGH (ref 0.44–1.00)
GFR calc Af Amer: 56 mL/min — ABNORMAL LOW (ref >60)
GFR, EST NON AFRICAN AMERICAN: 48 mL/min — AB (ref >60)
GLUCOSE: 128 mg/dL — AB (ref 65–99)
POTASSIUM: 3.9 mmol/L (ref 3.5–5.1)
SODIUM: 134 mmol/L — AB (ref 135–145)

## 2014-06-28 LAB — GLUCOSE, CAPILLARY
GLUCOSE-CAPILLARY: 124 mg/dL — AB (ref 65–99)
Glucose-Capillary: 144 mg/dL — ABNORMAL HIGH (ref 65–99)

## 2014-06-28 MED ORDER — FUROSEMIDE 40 MG PO TABS
40.0000 mg | ORAL_TABLET | Freq: Two times a day (BID) | ORAL | Status: DC
  Filled 2014-06-28 (×2): qty 1

## 2014-06-28 MED ORDER — CARVEDILOL 6.25 MG PO TABS: 6.2500 mg | ORAL_TABLET | Freq: Two times a day (BID) | ORAL | Status: DC

## 2014-06-28 MED ORDER — INSULIN ASPART 100 UNIT/ML ~~LOC~~ SOLN: 0.0000 [IU] | Freq: Three times a day (TID) | SUBCUTANEOUS | Status: AC

## 2014-06-28 MED ORDER — LOSARTAN POTASSIUM 25 MG PO TABS: 25.0000 mg | ORAL_TABLET | Freq: Every day | ORAL | Status: DC

## 2014-06-28 MED ORDER — INSULIN DETEMIR 100 UNIT/ML ~~LOC~~ SOLN: 17.0000 [IU] | Freq: Every day | SUBCUTANEOUS | Status: AC

## 2014-06-28 NOTE — Telephone Encounter (Signed)
Closed encounter °

## 2014-06-28 NOTE — Progress Notes (Signed)
Cardiac monitor discontinued, CCMD notified. Home discharge instructions discussed with daughter, Myrene Buddy. Discussed diet, activity, medications and follow up appt. Prescriptions for Coreg, Levemir, Novolog and Losartan given to Echo. Myrene Buddy verbally understands instructions with the use of teachback.

## 2014-06-28 NOTE — Progress Notes (Signed)
Patient taken out for discharge via wheelchair. Patient going home with daughter.

## 2014-06-28 NOTE — Discharge Summary (Signed)
Physician Discharge Summary  Paula [name]:[id number] DOB: 06-05-1940 DOA: 06/22/2014  PCP: [name], MD  Admit date: 06/22/2014 Discharge date: 06/28/2014  Time spent: 35 minutes  Recommendations for Outpatient Follow-up:  Needs further adjustment of diuretics.   Discharge Diagnoses:    Systolic and diastolic CHF, acute on chronic   HTN (hypertension)   Dementia   ICD (implantable cardioverter-defibrillator) in place   Paroxysmal atrial fibrillation   DM (diabetes mellitus), type 2 with renal complications   Chest pain   Chest pain at rest   Hypokalemia   Hypoxia   Discharge Condition: stable.   Diet recommendation: heart healthy  Filed Weights   06/26/14 0526 06/27/14 0640 06/28/14 0557  Weight: 83.623 kg (184 lb 5.7 oz) 83.561 kg (184 lb 3.5 oz) 83.356 kg (183 lb 12.3 oz)    History of present illness:  Paula [name] is a 74 y.o. female with PMH significant for Systolic CHF, hypertension, stroke, DM2, ICD and MI who presented to the ED with shortness of breath and left-sided chest pain that has been intermittent and increasing in severity over the past three days. She and her daughter are poor historians. She complains of increasing DOE, nausea, some vomiting a couple days ago, and sweating in addition to the pain under her left breast. Her chest pain was helped by nitroglycerin and has subsided. Her daughter said she hasn't eaten in two days now and doesn't understand why her CBG has increased. Her daughter is concerned that restarting the patient's victoza recently has contributed to her symptoms.  In the ED she was uncooperative with staff at times, fell asleep twice on the toilet while staff were trying to obtain a urine sample. CXR showed atelectasis at the right base. CT showed atrophy with small vessel chronic ischemic changes of deep cerebral white matter along with small old LEFT occipital and RIGHT basal ganglia infarcts but no acute intracranial  abnormalities. She is on 4L flow nasal cannula. POC Trop is 0.07, BNP is 1855. EKG is paced.   Hospital Course:  Principal Problem:  Systolic and diastolic CHF, acute on chronic: patient was admitted with acute on chronic HF exacerbation. She was treated with IV lasix. Her weight has decrease. She is feeling better. Cardiology help with management. She was found to be stable for discharge today.    HTN (hypertension)  Dementia  ICD (implantable cardioverter-defibrillator) in place  Paroxysmal atrial fibrillation  DM (diabetes mellitus), type 2 with renal complications; continue with home medications.   Hypokalemia corrected AKI: improved with treatment of CHF  Procedures:  none  Consultations:  cardiology  Discharge Exam: Filed Vitals:   06/28/14 0557  BP: 122/79  Pulse: 68  Temp: 98 F (36.7 C)  Resp: 20    General: Alert in no distress Cardiovascular: S 1, S 2 RRR Respiratory: CTA  Discharge Instructions   Discharge Instructions    Diet - low sodium heart healthy    Complete by:  As directed      Increase activity slowly    Complete by:  As directed           Current Discharge Medication List    START taking these medications   Details  losartan (COZAAR) 25 MG tablet Take 1 tablet (25 mg total) by mouth daily. Qty: 30 tablet, Refills: 0      CONTINUE these medications which have CHANGED   Details  carvedilol (COREG) 6.25 MG tablet Take 1 tablet (6.25 mg total) by mouth  2 (two) times daily with a meal. Qty: 60 tablet, Refills: 11    insulin aspart (NOVOLOG) 100 UNIT/ML injection Inject 0-9 Units into the skin 3 (three) times daily with meals. SSI  151-200 2u; 201-250  4u; 251- 300  6u; 301-350 8u; 351-400  8u; > 400  Call MD Qty: 10 mL, Refills: 11    insulin detemir (LEVEMIR) 100 UNIT/ML injection Inject 0.17 mLs (17 Units total) into the skin at bedtime. Qty: 10 mL, Refills: 11      CONTINUE these medications which have NOT CHANGED    Details  acetaminophen (TYLENOL) 325 MG tablet Take 2 tablets (650 mg total) by mouth 3 (three) times daily.    alendronate (FOSAMAX) 70 MG tablet TAKE 1 TABLET BY MOUTH EVERY WEEK AS DIRECTED Qty: 4 tablet, Refills: 0    ALPRAZolam (XANAX) 0.25 MG tablet Take 1 tablet (0.25 mg total) by mouth at bedtime. Qty: 10 tablet, Refills: 0    amLODipine (NORVASC) 10 MG tablet Take 10 mg by mouth at bedtime.     aspirin 81 MG chewable tablet Chew 1 tablet (81 mg total) by mouth daily.    furosemide (LASIX) 40 MG tablet Take 1 tablet (40 mg total) by mouth 2 (two) times daily. Qty: 62 tablet, Refills: 0    isosorbide mononitrate (IMDUR) 60 MG 24 hr tablet TAKE 1 TABLET BY MOUTH DAILY Qty: 30 tablet, Refills: 5    Liraglutide 18 MG/3ML SOPN Inject 1.2 mg into the skin daily. Victoza    loratadine (CLARITIN) 10 MG tablet Take 1 tablet (10 mg total) by mouth daily as needed for allergies. Qty: 30 tablet, Refills: 11   Associated Diagnoses: Allergic rhinitis    nitroGLYCERIN (NITROSTAT) 0.4 MG SL tablet Place 1 tablet (0.4 mg total) under the tongue every 5 (five) minutes as needed for chest pain. Qty: 15 tablet, Refills: 0    polyethylene glycol (MIRALAX / GLYCOLAX) packet Take 17 g by mouth daily as needed for mild constipation. Qty: 14 each, Refills: 0       No Known Allergies Follow-up Information    Follow up with Nanetta Batty, MD.   Specialties:  Cardiology, Radiology   Why:  Office will contact you to arrange followup, please give [place] a call if you do not hear from [place] in 2 business days.   Contact information:   [address] [place] [address] [phone]       Follow up with [facility].   Specialty:  Home Health Services   Why:  they will provide your home health care at your home   Contact information:   [address] [place] [address] [phone]       Follow up with [name], MD In 1 week.   Specialty:  Emergency  Medicine   Contact information:   [address] [place] [address] [phone]        The results of significant diagnostics from this hospitalization (including imaging, microbiology, ancillary and laboratory) are listed below for reference.    Significant Diagnostic Studies: Dg Chest 2 View  06/22/2014   CLINICAL DATA:  Three-day history of chest pain and exertional shortness of breath. Confusion.  EXAM: CHEST  2 VIEW  COMPARISON:  April 29, 2013  FINDINGS: There is atelectatic change in the right base. The lungs are otherwise clear. Heart is slightly enlarged with pulmonary vascularity within normal limits. Pacemaker leads are attached to the right atrium and right ventricle. No  pneumothorax. No adenopathy. No bone lesions.  IMPRESSION: Atelectasis right base. No edema or consolidation. Stable cardiac prominence.   Electronically Signed   By: Bretta Bang III M.D.   On: 06/22/2014 11:26   Ct Head Wo Contrast  06/22/2014   CLINICAL DATA:  Confusion, chest pain beginning 3 days ago a LEFT chest, exertional dyspnea, history hypertension, systolic CHF, stroke, diabetes mellitus, dementia, coronary disease post MI and AICD, smoker  EXAM: CT HEAD WITHOUT CONTRAST  TECHNIQUE: Contiguous axial images were obtained from the base of the skull through the vertex without intravenous contrast.  COMPARISON:  02/10/2013  FINDINGS: Generalized atrophy.  Normal ventricular morphology.  No midline shift or mass effect.  Small vessel chronic ischemic changes of deep cerebral white matter.  Small old infarcts LEFT occipital and at anterior RIGHT basal ganglia.  No intracranial hemorrhage, mass lesion, or acute infarction.  Visualized paranasal sinuses and mastoid air cells clear.  Bones unremarkable.  IMPRESSION: Atrophy with small vessel chronic ischemic changes of deep cerebral white matter.  Small old LEFT occipital and RIGHT basal ganglia infarcts.  No acute intracranial abnormalities.    Electronically Signed   By: Ulyses Southward M.D.   On: 06/22/2014 11:01    Microbiology: Recent Results (from the past 240 hour(s))  Urine culture     Status: None   Collection Time: 06/24/14  8:00 AM  Result Value Ref Range Status   Specimen Description URINE, CLEAN CATCH  Final   Special Requests NONE  Final   Culture   Final    MULTIPLE SPECIES PRESENT, SUGGEST RECOLLECTION IF CLINICALLY INDICATED   Report Status 06/25/2014 FINAL  Final     Labs: Basic Metabolic Panel:  Recent Labs Lab 06/22/14 1924  06/24/14 0401 06/25/14 0830 06/26/14 0412 06/27/14 0311 06/28/14 0543  NA  --   < > 132* 132* 135 134* 134*  K  --   < > 5.1 4.5 4.1 4.2 3.9  CL  --   < > 103 102 101 104 104  CO2  --   < > 22 21* 23 20* 23  GLUCOSE  --   < > 144* 67 88 78 128*  BUN  --   < > 17 17 19 18 15   CREATININE  --   < > 1.22* 1.15* 1.15* 1.18* 1.10*  CALCIUM  --   < > 9.5 9.3 9.9 9.7 9.9  MG 1.9  --   --   --   --   --   --   < > = values in this interval not displayed. Liver Function Tests:  Recent Labs Lab 06/22/14 1018  AST 22  ALT 27  ALKPHOS 80  BILITOT 1.3*  PROT 6.2*  ALBUMIN 3.6   No results for input(s): LIPASE, AMYLASE in the last 168 hours. No results for input(s): AMMONIA in the last 168 hours. CBC:  Recent Labs Lab 06/22/14 1018 06/23/14 0250  WBC 5.8 8.0  HGB 11.2* 11.0*  HCT 34.9* 34.4*  MCV 88.8 88.0  PLT 253 249   Cardiac Enzymes:  Recent Labs Lab 06/22/14 1545 06/22/14 1924 06/23/14 0126  TROPONINI 0.07* 0.07* 0.07*   BNP: BNP (last 3 results)  Recent Labs  06/22/14 1018  BNP 1855.7*    ProBNP (last 3 results) No results for input(s): PROBNP in the last 8760 hours.  CBG:  Recent Labs Lab 06/27/14 1115 06/27/14 1615 06/27/14 2128 06/28/14 0600 06/28/14 1139  GLUCAP 102* 80 103* 144* 124*  Signed:  Hartley Barefoot A  [facility] 06/28/2014, 12:52 PM

## 2014-06-28 NOTE — Progress Notes (Signed)
Patient ID: Paula [name], female   DOB: 02-24-1940, 74 y.o.   MRN: [id number]     Subjective:    Denies any SOB. Feels well. Wants to go home.  Objective:   Temp:  [98 F (36.7 C)-98.4 F (36.9 C)] 98 F (36.7 C) (06/27 0557) Pulse Rate:  [61-68] 68 (06/27 0557) Resp:  [20] 20 (06/27 0557) BP: (118-122)/(67-79) 122/79 mmHg (06/27 0557) SpO2:  [98 %-100 %] 100 % (06/27 0557) Weight:  [83.356 kg (183 lb 12.3 oz)] 83.356 kg (183 lb 12.3 oz) (06/27 0557) Last BM Date: 06/24/14  Filed Weights   06/26/14 0526 06/27/14 0640 06/28/14 0557  Weight: 83.623 kg (184 lb 5.7 oz) 83.561 kg (184 lb 3.5 oz) 83.356 kg (183 lb 12.3 oz)    Intake/Output Summary (Last 24 hours) at 06/28/14 1232 Last data filed at 06/28/14 0910  Gross per 24 hour  Intake    960 ml  Output    900 ml  Net     60 ml    Telemetry: SR  Exam:  General: NAD  Resp: clear  Cardiac: RRR, no m/r/g, nO JVD  GI: abdomen soft, NT, ND  MSK: no LE edema  Neuro: no focal deficits   Lab Results:  Basic Metabolic Panel:  Recent Labs Lab 06/22/14 1924  06/26/14 0412 06/27/14 0311 06/28/14 0543  NA  --   < > 135 134* 134*  K  --   < > 4.1 4.2 3.9  CL  --   < > 101 104 104  CO2  --   < > 23 20* 23  GLUCOSE  --   < > 88 78 128*  BUN  --   < > 19 18 15   CREATININE  --   < > 1.15* 1.18* 1.10*  CALCIUM  --   < > 9.9 9.7 9.9  MG 1.9  --   --   --   --   < > = values in this interval not displayed.  Liver Function Tests:  Recent Labs Lab 06/22/14 1018  AST 22  ALT 27  ALKPHOS 80  BILITOT 1.3*  PROT 6.2*  ALBUMIN 3.6    CBC:  Recent Labs Lab 06/22/14 1018 06/23/14 0250  WBC 5.8 8.0  HGB 11.2* 11.0*  HCT 34.9* 34.4*  MCV 88.8 88.0  PLT 253 249    Cardiac Enzymes:  Recent Labs Lab 06/22/14 1545 06/22/14 1924 06/23/14 0126  TROPONINI 0.07* 0.07* 0.07*    BNP: No results for input(s): PROBNP in the last 8760 hours.  Coagulation: No results for input(s): INR in the last 168  hours.  ECG:   Medications:   Scheduled Medications: . acetaminophen  650 mg Oral TID  . amLODipine  10 mg Oral QHS  . aspirin  81 mg Oral Daily  . carvedilol  6.25 mg Oral BID WC  . docusate sodium  100 mg Oral BID  . heparin  5,000 Units Subcutaneous 3 times per day  . insulin aspart  0-15 Units Subcutaneous TID WC  . insulin aspart  3 Units Subcutaneous TID WC  . insulin detemir  17 Units Subcutaneous QHS  . isosorbide mononitrate  60 mg Oral Daily  . losartan  25 mg Oral Daily  . sodium chloride  3 mL Intravenous Q12H  . sodium chloride  3 mL Intravenous Q12H    Infusions:    PRN Medications: sodium chloride, acetaminophen **OR** acetaminophen, ALPRAZolam, bisacodyl, loratadine, nitroGLYCERIN, ondansetron **OR** ondansetron (ZOFRAN) IV,  polyethylene glycol, sodium chloride     Assessment/Plan    1. Acute on chronic systolic HF - echo 06/2014 LVEF 15-20%, mild RV dysfunction. Diastolic function not described, however decel time and E/A consistent with restrictive function - weight is down 7 lbs from admission. Appears euvolemic now.  - she is on coreg and imdur. She was on losartan  daily in Jan 2016, it was stopped at 02/08/14 discharge after presenting with AKI in setting of diverticulitis, does not appear restarted since then.Restarted low dose  daily - will change lasix to 40 mg po bid  - OK for DC from cardiac standpoint. Will need follow up with Dr. Allyson Sabal in office.   2. PAF - has not been on anticoag by her primary cardiologist, from last clinic note concern for her cognitive limitations - continue beta blocker   Desmond Tufano [place] MD, [facility]  06/28/2014 12:37 PM

## 2014-06-28 NOTE — Progress Notes (Signed)
Physical Therapy Treatment Patient Details Name: Paula [name] MRN: [id number] DOB: 08-13-1940 Today's Date: 06/28/2014    History of Present Illness Paula [name] is a 74 y.o. female with PMH significant for Systolic CHF, hypertension, stroke, DM2, ICD and MI who presented to the ED with shortness of breath and left-sided chest pain that has been intermittent and increasing in severity over the past three days.    PT Comments    Pt progressing with mobility, ambulated 300' with RW and supervision, 1 standing rest break, O2 sats 99% on RA, HR 83 bpm. Continued swelling in bilateral ankles and fatigue after activity. PT will continue to follow.    Follow Up Recommendations  Home health PT;Supervision/Assistance - 24 hour     Equipment Recommendations  3in1 (PT)    Recommendations for Other Services       Precautions / Restrictions Precautions Precautions: Fall Restrictions Weight Bearing Restrictions: No    Mobility  Bed Mobility Overal bed mobility: Modified Independent Bed Mobility: Supine to Sit     Supine to sit: Modified independent (Device/Increase time)     General bed mobility comments: pt assumed long sitting in bed and then moved legs easily off bed to one side and then the other  Transfers Overall transfer level: Modified independent Equipment used: Rolling walker (2 wheeled) Transfers: Sit to/from Stand Sit to Stand: Modified independent (Device/Increase time)         General transfer comment: pt stood easily to RW with no LOB  Ambulation/Gait Ambulation/Gait assistance: Supervision Ambulation Distance (Feet): 300 Feet Assistive device: Rolling walker (2 wheeled) Gait Pattern/deviations: Step-through pattern;Trunk flexed Gait velocity: WFL Gait velocity interpretation: >2.62 ft/sec, indicative of independent community ambulator General Gait Details: pt able to converse while walking in hall, vc's for erect posture, took one standing rest  break. O2 sats 99% HR 83 bpm with ambulation, DOE 2/4. Reverts to trunk flexed shortly after correction   Stairs            Wheelchair Mobility    Modified Rankin (Stroke Patients Only)       Balance Overall balance assessment: Needs assistance Sitting-balance support: No upper extremity supported Sitting balance-Leahy Scale: Good     Standing balance support: No upper extremity supported Standing balance-Leahy Scale: Fair Standing balance comment: pt able to stand without use of UE though limitations evident with dynamic activity                    Cognition Arousal/Alertness: Awake/alert Behavior During Therapy: WFL for tasks assessed/performed Overall Cognitive Status: History of cognitive impairments - at baseline                      Exercises      General Comments General comments (skin integrity, edema, etc.): pt's daughter reports that pt oftentimes does not use RW at home. Reinforced safety benefits of RW for home.      Pertinent Vitals/Pain Pain Assessment: No/denies pain    Home Living                      Prior Function            PT Goals (current goals can now be found in the care plan section) Acute Rehab PT Goals Patient Stated Goal: go home, but states not yet, until she feels a little better PT Goal Formulation: With patient Time For Goal Achievement: 07/07/14 Progress towards PT goals: Progressing toward goals  Frequency  Min 3X/week    PT Plan Current plan remains appropriate    Co-evaluation             End of Session   Activity Tolerance: Patient tolerated treatment well Patient left: in bed;with call bell/phone within reach;with family/visitor present     Time: 1240-1256 PT Time Calculation (min) (ACUTE ONLY): 16 min  Charges:  $Gait Training: 8-22 mins                    G Codes:     Lyanne Co, PT  Acute Rehab Services  [phone]  Pecola Leisure, [place] 06/28/2014, 1:50 PM

## 2014-07-12 ENCOUNTER — Telehealth: Payer: Self-pay | Admitting: *Deleted

## 2014-07-12 NOTE — Telephone Encounter (Signed)
Spoke to daughter regarding abnormal corvue. Daughter stated that she was not aware of any CHF sx's in patient bc pt was sleep when she returned home. She stated that she will assess her mother for any sx's and call tomorrow. # for NL given. Daughter voiced understanding.

## 2014-07-19 ENCOUNTER — Encounter: Payer: Self-pay | Admitting: Cardiology

## 2014-07-23 ENCOUNTER — Encounter: Payer: Medicare Other | Admitting: *Deleted

## 2014-07-23 ENCOUNTER — Telehealth: Payer: Self-pay | Admitting: *Deleted

## 2014-07-23 NOTE — Telephone Encounter (Signed)
ICM transmission received today.  [facility]ol diagnostics are improved for the patient.  She was admitted 6/21-6/29 for CHF. Per her discharge summary, a few medication changes were made at the time. I left a message for the patient's daughter, Serina Cowper, to call and follow up on how the patient is doing and notify her of optivol trends. I will forward to Kara Mead and Irving Burton to follow up with the patient's daughter next week if she does not call back.

## 2014-07-24 ENCOUNTER — Other Ambulatory Visit: Payer: Self-pay | Admitting: Cardiovascular Disease

## 2014-07-26 NOTE — Telephone Encounter (Signed)
Rx(s) sent to pharmacy electronically.  

## 2014-07-28 NOTE — Telephone Encounter (Signed)
No answer, no message left due to generic VM.

## 2014-07-30 NOTE — Telephone Encounter (Signed)
No answer, no message due to generic (and full) VM.

## 2014-08-03 ENCOUNTER — Other Ambulatory Visit: Payer: Self-pay | Admitting: Cardiovascular Disease

## 2014-08-03 ENCOUNTER — Ambulatory Visit (INDEPENDENT_AMBULATORY_CARE_PROVIDER_SITE_OTHER): Payer: Medicare Other | Admitting: Cardiovascular Disease

## 2014-08-03 ENCOUNTER — Encounter: Payer: Self-pay | Admitting: Cardiovascular Disease

## 2014-08-03 VITALS — BP 120/80 | HR 92 | Ht 65.0 in | Wt 168.8 lb

## 2014-08-03 DIAGNOSIS — E785 Hyperlipidemia, unspecified: Secondary | ICD-10-CM | POA: Diagnosis not present

## 2014-08-03 DIAGNOSIS — I5043 Acute on chronic combined systolic (congestive) and diastolic (congestive) heart failure: Secondary | ICD-10-CM

## 2014-08-03 DIAGNOSIS — I6350 Cerebral infarction due to unspecified occlusion or stenosis of unspecified cerebral artery: Secondary | ICD-10-CM | POA: Diagnosis not present

## 2014-08-03 DIAGNOSIS — I10 Essential (primary) hypertension: Secondary | ICD-10-CM | POA: Diagnosis not present

## 2014-08-03 MED ORDER — CARVEDILOL 6.25 MG PO TABS: 6.2500 mg | ORAL_TABLET | Freq: Two times a day (BID) | ORAL | Status: AC

## 2014-08-03 MED ORDER — FUROSEMIDE 40 MG PO TABS: 40.0000 mg | ORAL_TABLET | Freq: Two times a day (BID) | ORAL | Status: AC

## 2014-08-03 MED ORDER — NITROGLYCERIN 0.4 MG SL SUBL: 0.4000 mg | SUBLINGUAL_TABLET | SUBLINGUAL | Status: AC | PRN

## 2014-08-03 MED ORDER — LOSARTAN POTASSIUM 25 MG PO TABS: 25.0000 mg | ORAL_TABLET | Freq: Every day | ORAL | Status: AC

## 2014-08-03 MED ORDER — AMLODIPINE BESYLATE 10 MG PO TABS: 10.0000 mg | ORAL_TABLET | Freq: Every day | ORAL | Status: DC

## 2014-08-03 NOTE — Assessment & Plan Note (Signed)
Paula [name] has a nonischemic cardiomyopathy with an ejection fraction of 15-20% by recent 2-D echo status post biventricular ICD implantation which is followed by Dr. Royann Shivers. She was recently admitted to [facility] June 21 through the 27th because of volume overload/congestive heart failure. She was diuresed.

## 2014-08-03 NOTE — Patient Instructions (Signed)
We request that you follow-up in: 3 months with an extender and in 6 months with Dr Berry  You will receive a reminder letter in the mail two months in advance. If you don't receive a letter, please call our office to schedule the follow-up appointment.   

## 2014-08-03 NOTE — Telephone Encounter (Signed)
Rx(s) sent to pharmacy electronically.  

## 2014-08-03 NOTE — Assessment & Plan Note (Signed)
History of hyperlipidemia not on a statin drug with recent lipid profile performed 02/10/13 revealing a total cholesterol 111, LDL 59 HDL of 31.

## 2014-08-03 NOTE — Assessment & Plan Note (Signed)
History of hypertension blood pressure measured at 120/80. She is on carvedilol, amlodipine, Imdur and losartan. Continued current meds at current dosing

## 2014-08-03 NOTE — Progress Notes (Signed)
08/03/2014 Paula [name]   22-Dec-1940  [id number]  Primary Physician [name], MD Primary Cardiologist: Runell Gess MD Paula [name]   HPI:  The patient is a 74 year old, mildly overweight, divorced [place] American female, mother of 4, grandmother to 4 grandchildren who is accompanied by her eldest daughter Paula [name].. I last saw her in the office 12/09/13.Marland Kitchen She has a history of nonischemic cardiomyopathy status post cath by myself July 21, 2008, revealing an EF in the 20% range with normal coronary arteries. She has hypertension, hyperlipidemia, and non-insulin requiring diabetes. I referred her to Dr. Royann Shivers on August 29, 2010, who talked to her about ICD therapy for primary prevention of sudden death. She ultimately had a BiV-ICD implanted and was followed by Dr. Royann Shivers up until 2013 after which she failed to return for followup. She also has a mild degree of dementia. Her blood pressure has been very difficult to control. She was hospitalized earlier this year with a stroke. She denies chest pain or shortness of breath.. She saw Dr. Royann Shivers in August who interrogated her ICD which revealed an episode of supraventricular tachycardia earlier this year that was asymptomatic. He mentioned reviewing the electrograms however there is no further mention of this. There was a question of beginning her on an oral anticoagulant because of the possibility that this was an embolic stroke secondary to PAF. Since I saw her 6 months ago she has been hospitalized from 6/21 through 6/27 with congestive heart failure. Repeat echo revealed an ejection fraction of 15-20%. She was diuresed successfully. She currently denies shortness of breath. We talked about salt restriction. She is weighed every day and is on twice a day furosemide.   Current Outpatient Prescriptions  Medication Sig Dispense Refill  . acetaminophen (TYLENOL) 325 MG tablet Take 2 tablets (650 mg total) by mouth 3  (three) times daily.    Marland Kitchen alendronate (FOSAMAX) 70 MG tablet TAKE 1 TABLET BY MOUTH EVERY WEEK AS DIRECTED 4 tablet 0  . ALPRAZolam (XANAX) 0.25 MG tablet Take 1 tablet (0.25 mg total) by mouth at bedtime. 10 tablet 0  . amLODipine (NORVASC) 10 MG tablet Take 10 mg by mouth at bedtime.     Marland Kitchen aspirin 81 MG chewable tablet Chew 1 tablet (81 mg total) by mouth daily.    . carvedilol (COREG) 6.25 MG tablet Take 1 tablet (6.25 mg total) by mouth 2 (two) times daily with a meal. 60 tablet 11  . furosemide (LASIX) 40 MG tablet Take 1 tablet (40 mg total) by mouth 2 (two) times daily. 62 tablet 0  . insulin aspart (NOVOLOG) 100 UNIT/ML injection Inject 0-9 Units into the skin 3 (three) times daily with meals. SSI  151-200 2u; 201-250  4u; 251- 300  6u; 301-350 8u; 351-400  8u; > 400  Call MD 10 mL 11  . insulin detemir (LEVEMIR) 100 UNIT/ML injection Inject 0.17 mLs (17 Units total) into the skin at bedtime. 10 mL 11  . isosorbide mononitrate (IMDUR) 60 MG 24 hr tablet TAKE 1 TABLET BY MOUTH DAILY 30 tablet 5  . Liraglutide 18 MG/3ML SOPN Inject 1.2 mg into the skin daily. Victoza    . losartan (COZAAR) 25 MG tablet Take 1 tablet (25 mg total) by mouth daily. 30 tablet 0  . nitroGLYCERIN (NITROSTAT) 0.4 MG SL tablet Place 1 tablet (0.4 mg total) under the tongue every 5 (five) minutes as needed for chest pain. 15 tablet 0  . polyethylene glycol (MIRALAX /  GLYCOLAX) packet Take 17 g by mouth daily as needed for mild constipation. 14 each 0  . loratadine (CLARITIN) 10 MG tablet Take 1 tablet (10 mg total) by mouth daily as needed for allergies. (Patient not taking: Reported on 08/03/2014) 30 tablet 11   No current facility-administered medications for this visit.    No Known Allergies  History   Social History  . Marital Status: Single    Spouse Name: N/A  . Number of Children: N/A  . Years of Education: 10   Occupational History  . Retired    Social History Main Topics  . Smoking status: Former  Smoker    Types: Cigarettes    Quit date: 06/08/2014  . Smokeless tobacco: Never Used  . Alcohol Use: No  . Drug Use: No  . Sexual Activity: No   Other Topics Concern  . Not on file   Social History Narrative   Lives with her daughter in [place], [place].     Review of Systems: General: negative for chills, fever, night sweats or weight changes.  Cardiovascular: negative for chest pain, dyspnea on exertion, edema, orthopnea, palpitations, paroxysmal nocturnal dyspnea or shortness of breath Dermatological: negative for rash Respiratory: negative for cough or wheezing Urologic: negative for hematuria Abdominal: negative for nausea, vomiting, diarrhea, bright red blood per rectum, melena, or hematemesis Neurologic: negative for visual changes, syncope, or dizziness All other systems reviewed and are otherwise negative except as noted above.    Blood pressure 120/80, pulse 92, height 5\' 5"  (1.651 m), weight 168 lb 12.8 oz (76.567 kg).  General appearance: alert and no distress Neck: no adenopathy, no carotid bruit, no JVD, supple, symmetrical, trachea midline and thyroid not enlarged, symmetric, no tenderness/mass/nodules Lungs: clear to auscultation bilaterally Heart: regular rate and rhythm, S1, S2 normal, no murmur, click, rub or gallop Extremities: chronic bilateral lower extremity edema  EKG not performed today  ASSESSMENT AND PLAN:   Systolic and diastolic CHF, acute on chronic Paula [name] has a nonischemic cardiomyopathy with an ejection fraction of 15-20% by recent 2-D echo status post biventricular ICD implantation which is followed by Dr. Royann Shivers. She was recently admitted to [facility] June 21 through the 27th because of volume overload/congestive heart failure. She was diuresed.  Hyperlipidemia History of hyperlipidemia not on a statin drug with recent lipid profile performed 02/10/13 revealing a total cholesterol 111, LDL 59 HDL of 31.  HTN  (hypertension) History of hypertension blood pressure measured at 120/80. She is on carvedilol, amlodipine, Imdur and losartan. Continued current meds at current dosing      Runell Gess MD [facility], [facility] 08/03/2014 11:27 AM\

## 2014-08-11 NOTE — Telephone Encounter (Signed)
Fourth attempt, no message due to generic VM.

## 2014-09-07 ENCOUNTER — Encounter: Payer: Self-pay | Admitting: *Deleted

## 2014-09-20 ENCOUNTER — Encounter: Payer: Self-pay | Admitting: Cardiovascular Disease

## 2014-09-20 ENCOUNTER — Telehealth: Payer: Self-pay

## 2014-09-20 ENCOUNTER — Ambulatory Visit (INDEPENDENT_AMBULATORY_CARE_PROVIDER_SITE_OTHER): Payer: Medicare Other | Admitting: *Deleted

## 2014-09-20 DIAGNOSIS — Z95810 Presence of automatic (implantable) cardiac defibrillator: Secondary | ICD-10-CM

## 2014-09-20 NOTE — Telephone Encounter (Signed)
ICM transmission received.  Attempted call to patient or daughter Paula [name] and left message for return call.

## 2014-09-20 NOTE — Progress Notes (Signed)
Remote ICD transmission.   

## 2014-09-27 NOTE — Telephone Encounter (Signed)
Unable to reach patient for ICM follow up.  Rescheduled for 10/11/2014

## 2014-09-28 LAB — CUP PACEART REMOTE DEVICE CHECK
Brady Statistic AP VP Percent: 0.01 %
Brady Statistic AP VS Percent: 7.14 %
Brady Statistic AS VP Percent: 0.04 %
Brady Statistic AS VS Percent: 92.81 %
Brady Statistic RA Percent Paced: 7.15 %
HIGH POWER IMPEDANCE MEASURED VALUE: 45 Ohm
HighPow Impedance: 399 Ohm
HighPow Impedance: 55 Ohm
Lead Channel Impedance Value: 456 Ohm
Lead Channel Impedance Value: 475 Ohm
Lead Channel Pacing Threshold Amplitude: 0.75 V
Lead Channel Pacing Threshold Amplitude: 0.875 V
Lead Channel Pacing Threshold Pulse Width: 0.4 ms
Lead Channel Sensing Intrinsic Amplitude: 31.625 mV
Lead Channel Setting Pacing Amplitude: 2 V
Lead Channel Setting Pacing Pulse Width: 0.4 ms
Lead Channel Setting Sensing Sensitivity: 0.3 mV
MDC IDC MSMT BATTERY VOLTAGE: 3.09 V
MDC IDC MSMT LEADCHNL RA PACING THRESHOLD PULSEWIDTH: 0.4 ms
MDC IDC MSMT LEADCHNL RA SENSING INTR AMPL: 1.5 mV
MDC IDC MSMT LEADCHNL RA SENSING INTR AMPL: 1.5 mV
MDC IDC MSMT LEADCHNL RV SENSING INTR AMPL: 31.625 mV
MDC IDC SESS DTM: 20160919083828
MDC IDC SET LEADCHNL RV PACING AMPLITUDE: 2.5 V
MDC IDC SET ZONE DETECTION INTERVAL: 370 ms
MDC IDC STAT BRADY RV PERCENT PACED: 0.05 %
Zone Setting Detection Interval: 300 ms
Zone Setting Detection Interval: 350 ms
Zone Setting Detection Interval: 360 ms

## 2014-09-29 ENCOUNTER — Encounter: Payer: Self-pay | Admitting: *Deleted

## 2014-10-06 ENCOUNTER — Encounter: Payer: Self-pay | Admitting: Cardiology

## 2014-10-11 ENCOUNTER — Telehealth: Payer: Self-pay

## 2014-10-11 NOTE — Telephone Encounter (Signed)
ICM transmission received.  Attempted patient call and no answer.  

## 2014-10-12 NOTE — Telephone Encounter (Signed)
Attempted call to patient and daughter answered phone.  She stated she speaks for patient but no DPR found on file.  Daughter Paula [name] stated the form should be completed. Explained I was not able to locate the DPR and asked to speak with patient to obtain verbal permission.  Unable to speak with patient at this time.  This is the 1st time able to speak with someone since enrolling in [facility] clinic 05/21/2014.  Unable to continue ICM clinic enrollment at this time until DPR is on file.

## 2014-10-18 ENCOUNTER — Other Ambulatory Visit: Payer: Self-pay | Admitting: Cardiovascular Disease

## 2014-10-18 DIAGNOSIS — I5043 Acute on chronic combined systolic (congestive) and diastolic (congestive) heart failure: Secondary | ICD-10-CM

## 2014-10-18 MED ORDER — AMLODIPINE BESYLATE 10 MG PO TABS: 10.0000 mg | ORAL_TABLET | Freq: Every day | ORAL | Status: DC

## 2014-11-02 ENCOUNTER — Encounter: Payer: Self-pay | Admitting: *Deleted

## 2014-11-29 ENCOUNTER — Encounter: Payer: Self-pay | Admitting: *Deleted

## 2014-12-03 ENCOUNTER — Other Ambulatory Visit: Payer: Self-pay | Admitting: *Deleted

## 2014-12-03 NOTE — Telephone Encounter (Signed)
Refill request received for Lisinopril.  Not on patient current med list but is list along with losartan in the external list.  Will need to research.

## 2014-12-28 ENCOUNTER — Encounter: Payer: Self-pay | Admitting: *Deleted

## 2015-01-04 ENCOUNTER — Ambulatory Visit: Payer: Medicare Other | Admitting: Cardiovascular Disease

## 2015-01-28 ENCOUNTER — Other Ambulatory Visit: Payer: Self-pay | Admitting: *Deleted

## 2015-01-28 DIAGNOSIS — I5043 Acute on chronic combined systolic (congestive) and diastolic (congestive) heart failure: Secondary | ICD-10-CM

## 2015-01-28 MED ORDER — AMLODIPINE BESYLATE 10 MG PO TABS: 10.0000 mg | ORAL_TABLET | Freq: Every day | ORAL | Status: AC

## 2015-01-30 ENCOUNTER — Emergency Department (HOSPITAL_COMMUNITY)
Admission: EM | Admit: 2015-01-30 | Discharge: 2015-02-02 | Disposition: E | Payer: Medicare Other | Attending: Emergency Medicine | Admitting: Emergency Medicine

## 2015-01-30 ENCOUNTER — Encounter (HOSPITAL_COMMUNITY): Payer: Self-pay

## 2015-01-30 DIAGNOSIS — E875 Hyperkalemia: Secondary | ICD-10-CM | POA: Diagnosis not present

## 2015-01-30 DIAGNOSIS — Z794 Long term (current) use of insulin: Secondary | ICD-10-CM | POA: Diagnosis not present

## 2015-01-30 DIAGNOSIS — I252 Old myocardial infarction: Secondary | ICD-10-CM | POA: Insufficient documentation

## 2015-01-30 DIAGNOSIS — I10 Essential (primary) hypertension: Secondary | ICD-10-CM | POA: Diagnosis not present

## 2015-01-30 DIAGNOSIS — I469 Cardiac arrest, cause unspecified: Secondary | ICD-10-CM | POA: Diagnosis not present

## 2015-01-30 DIAGNOSIS — Z8739 Personal history of other diseases of the musculoskeletal system and connective tissue: Secondary | ICD-10-CM | POA: Insufficient documentation

## 2015-01-30 DIAGNOSIS — F0390 Unspecified dementia without behavioral disturbance: Secondary | ICD-10-CM | POA: Insufficient documentation

## 2015-01-30 DIAGNOSIS — Z9889 Other specified postprocedural states: Secondary | ICD-10-CM | POA: Insufficient documentation

## 2015-01-30 DIAGNOSIS — E119 Type 2 diabetes mellitus without complications: Secondary | ICD-10-CM | POA: Insufficient documentation

## 2015-01-30 DIAGNOSIS — Z87891 Personal history of nicotine dependence: Secondary | ICD-10-CM | POA: Diagnosis not present

## 2015-01-30 DIAGNOSIS — Z79899 Other long term (current) drug therapy: Secondary | ICD-10-CM | POA: Insufficient documentation

## 2015-01-30 DIAGNOSIS — Z7982 Long term (current) use of aspirin: Secondary | ICD-10-CM | POA: Diagnosis not present

## 2015-01-30 DIAGNOSIS — Z8673 Personal history of transient ischemic attack (TIA), and cerebral infarction without residual deficits: Secondary | ICD-10-CM | POA: Diagnosis not present

## 2015-01-30 DIAGNOSIS — Z95810 Presence of automatic (implantable) cardiac defibrillator: Secondary | ICD-10-CM | POA: Insufficient documentation

## 2015-01-30 DIAGNOSIS — R079 Chest pain, unspecified: Secondary | ICD-10-CM

## 2015-01-30 LAB — I-STAT CHEM 8, ED
BUN: 12 mg/dL (ref 6–20)
CHLORIDE: 106 mmol/L (ref 101–111)
Calcium, Ion: 1.19 mmol/L (ref 1.13–1.30)
Creatinine, Ser: 1.2 mg/dL — ABNORMAL HIGH (ref 0.44–1.00)
GLUCOSE: 445 mg/dL — AB (ref 65–99)
HCT: 45 % (ref 36.0–46.0)
Hemoglobin: 15.3 g/dL — ABNORMAL HIGH (ref 12.0–15.0)
Potassium: 6.1 mmol/L (ref 3.5–5.1)
Sodium: 134 mmol/L — ABNORMAL LOW (ref 135–145)
TCO2: 10 mmol/L (ref 0–100)

## 2015-01-30 LAB — CBG MONITORING, ED: GLUCOSE-CAPILLARY: 150 mg/dL — AB (ref 65–99)

## 2015-01-30 LAB — I-STAT CG4 LACTIC ACID, ED: Lactic Acid, Venous: 16.42 mmol/L (ref 0.5–2.0)

## 2015-01-30 MED ORDER — EPINEPHRINE HCL 0.1 MG/ML IJ SOSY
PREFILLED_SYRINGE | INTRAMUSCULAR | Status: AC | PRN
  Administered 2015-01-30 (×4): 1 mg via INTRAVENOUS

## 2015-01-30 MED ORDER — CALCIUM CHLORIDE 10 % IV SOLN
INTRAVENOUS | Status: AC | PRN
  Administered 2015-01-30: 1 g via INTRAVENOUS

## 2015-01-30 MED ORDER — SODIUM BICARBONATE 8.4 % IV SOLN
INTRAVENOUS | Status: AC | PRN
  Administered 2015-01-30: 50 meq via INTRAVENOUS

## 2015-02-01 MED FILL — Medication: Qty: 1 | Status: AC

## 2015-02-02 DIAGNOSIS — 419620001 Death: Secondary | SNOMED CT

## 2015-02-02 NOTE — ED Notes (Signed)
Pt. BIB ems with CPR in progress. Pt. Had witnessed arrest by family and fire department. Family reports pt. Had been complaining of CP and anxiety, pt. Took xanax with no improvement. Pt. Has ICD that was shocking pt. On EMS arrival. Pt. Was unresponsive at that time. Pt. In vtach and vfib, shocked 5 times by EMS, given 450 of amiodarone, 6 epi, and 2 narcan.

## 2015-02-02 NOTE — ED Notes (Addendum)
Patient to morgue. Bed control called.

## 2015-02-02 NOTE — ED Provider Notes (Signed)
CSN: [id number]     Arrival date & time 13-Feb-2015  1726 History   First MD Initiated Contact with Patient 02-13-15 1743     Chief Complaint  Patient presents with  . Cardiac Arrest     (Consider location/radiation/quality/duration/timing/severity/associated sxs/prior Treatment) HPI   11 y f w PMH NICM w/ ef of 15 %, AICD, htn, cva, CAD, hl, dm presenting with cardiac arrest.  Patient was at her birthday party with family when she started having chest pain.  EMS was called out and when fire arrived she was initially awake but became unresponsive and was found to be pulseless.  Immediate cpr was started.  EMS arrived and she was in vtach/vfib on the way to the hospital and a king airway was placed.  She had defib x5, epi x6, 450mg  amio w/o rosc and w/ persistent vtach   Past Medical History  Diagnosis Date  . Nonischemic cardiomyopathy (HCC)     last cath 09/21/08 - normal left main, LAD, LCx, ramus intermedius, RCA  . Hypertension   . Stroke (HCC)   . Headache(784.0)   . Dementia 05/27/2012  . Osteopenia     DEXA 2012 : T score hip -2.3, femur -2.1  . Chronic systolic CHF (congestive heart failure), NYHA class 2 (HCC)     nonischemic cardiopathy, EF 25% by 2-D echo February 2015  . 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 Royann Shivers  . Myocardial infarction [facility]) 1991    Per pt report 2003, no ischemic evaluation  . 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 Automatic implantable cardioverter-defibrillator in situ   . Hyperlipidemia   . Shortness of breath dyspnea   . Diabetes mellitus    Past Surgical History  Procedure Laterality Date  . Back surgery    . Abdominal hysterectomy    . Cardiac catheterization      [name] 12/22/2001  . Transthoracic echocardiogram  05/2010    EF 20-25%, mod conc hypertrophy, grade 3 diastolic dysfunction, elevated LVEDP; dilated MV annulus with mod regurg; LA severely  dilated; RV mildly dilated; RA mod-severely dilated; mild-mod TR; PA peak pressure ; mod pulm hypertension  . Transthoracic echocardiogram  02/10/2013    EF 25%, mild AV regurg; LA mod dialted; RA mildly dilated; mod TR; PA peak  . Nm myocar perf wall motion  08/2008    lexiscan - diaphragmatic attenuation of inferior wall, EF 37%  . Cardiac catheterization  12/21/2001    normal LAD, Cfx w/prox luminal irregularities, RCA dominant with diffuse luminal irregularities (Dr. Daiva Nakayama)  . Cardiac catheterization  09/21/2008    normal left main/LAD/Cfx/RCA, ramus intermedius is large and normal (Dr. Erlene Quan)  . Implantable cardioverter defibrillator implant N/A 12/15/2010    Procedure: IMPLANTABLE CARDIOVERTER DEFIBRILLATOR IMPLANT;  Surgeon: Thurmon Fair, MD; Medtronic protecta XT DR    Family History  Problem Relation Age of Onset  . Stroke Mother    Social History  Substance Use Topics  . Smoking status: Former Smoker    Types: Cigarettes    Quit date: 06/08/2014  . Smokeless tobacco: Never Used  . Alcohol Use: No   OB History    No data available     Review of Systems  Unable to perform ROS: Patient unresponsive      Allergies  Review of patient's allergies indicates no known allergies.  Home Medications   Prior to Admission medications  Medication Sig Start Date End Date Taking? Authorizing Provider  acetaminophen (TYLENOL) 325 MG tablet Take 2 tablets (650 mg total) by mouth 3 (three) times daily. 02/08/14   Joseph Art, DO  alendronate (FOSAMAX) 70 MG tablet TAKE 1 TABLET BY MOUTH EVERY WEEK AS DIRECTED 05/12/13   Oneal Grout, MD  ALPRAZolam Prudy Feeler) 0.25 MG tablet Take 1 tablet (0.25 mg total) by mouth at bedtime. 02/08/14   Joseph Art, DO  amLODipine (NORVASC) 10 MG tablet Take 1 tablet (10 mg total) by mouth at bedtime. 01/28/15   Runell Gess, MD  aspirin 81 MG chewable tablet Chew 1 tablet (81 mg total) by mouth daily. 02/08/14   Joseph Art,  DO  carvedilol (COREG) 6.25 MG tablet Take 1 tablet (6.25 mg total) by mouth 2 (two) times daily with a meal. 08/03/14   Runell Gess, MD  furosemide (LASIX) 40 MG tablet Take 1 tablet (40 mg total) by mouth 2 (two) times daily. 08/03/14   Runell Gess, MD  insulin aspart (NOVOLOG) 100 UNIT/ML injection Inject 0-9 Units into the skin 3 (three) times daily with meals. SSI  151-200 2u; 201-250  4u; 251- 300  6u; 301-350 8u; 351-400  8u; > 400  Call MD 06/28/14   Jon Billings A Regalado, MD  insulin detemir (LEVEMIR) 100 UNIT/ML injection Inject 0.17 mLs (17 Units total) into the skin at bedtime. 06/28/14   Belkys A Regalado, MD  isosorbide mononitrate (IMDUR) 60 MG 24 hr tablet TAKE 1 TABLET BY MOUTH DAILY 06/21/14   Runell Gess, MD  isosorbide mononitrate (IMDUR) 60 MG 24 hr tablet TAKE 1 TABLET BY MOUTH DAILY 08/03/14   Runell Gess, MD  Liraglutide 18 MG/3ML SOPN Inject 1.2 mg into the skin daily. Victoza    Historical Provider, MD  loratadine (CLARITIN) 10 MG tablet Take 1 tablet (10 mg total) by mouth daily as needed for allergies. Patient not taking: Reported on 08/03/2014 04/09/13   Sharee Holster, NP  losartan (COZAAR) 25 MG tablet Take 1 tablet (25 mg total) by mouth daily. 08/03/14   Runell Gess, MD  nitroGLYCERIN (NITROSTAT) 0.4 MG SL tablet Place 1 tablet (0.4 mg total) under the tongue every 5 (five) minutes as needed for chest pain. 08/03/14   Runell Gess, MD  polyethylene glycol [facility] Ethelene Hal) packet Take 17 g by mouth daily as needed for mild constipation. 02/08/14   Jessica U Vann, DO   BP 0/0 mmHg  Pulse 0  Temp(Src) 95.9 F (35.5 C) (Temporal)  Resp 14  Wt 77.111 kg  SpO2 100% Physical Exam  Constitutional: She appears well-developed.  HENT:  Head: Normocephalic and atraumatic.  King airway in place  Eyes:  Pupils 5mm and non-reactive  Neck: Normal range of motion. Neck supple.  Cardiovascular:  Pulseless  Pulmonary/Chest:  Being bagged with king airway   Abdominal: Soft. She exhibits no distension.  Musculoskeletal: She exhibits no edema.  Neurological:  unresponsive  Skin: No rash noted.    ED Course  Procedures (including critical care time) Labs Review Labs Reviewed  CBG MONITORING, ED - Abnormal; Notable for the following:    Glucose-Capillary 150 (*)    All other components within normal limits  I-STAT CHEM 8, ED - Abnormal; Notable for the following:    Sodium 134 (*)    Potassium 6.1 (*)    Creatinine, Ser 1.20 (*)    Glucose, Bld 445 (*)    Hemoglobin 15.3 (*)  All other components within normal limits  I-STAT CG4 LACTIC ACID, ED - Abnormal; Notable for the following:    Lactic Acid, Venous 16.42 (*)    All other components within normal limits    Imaging Review No results found. I have personally reviewed and evaluated these images and lab results as part of my medical decision-making.   EKG Interpretation None      MDM   Final diagnoses:  Cardiac arrest (HCC)  Died  Chest pain, unspecified   77 y f w PMH NICM w/ ef of 15 %, AICD, htn, cva, CAD, hl, dm presenting with cardiac arrest with refractory vtach alternating with fib. On arrival, pt has had approx 40 min of cpr s/p defib x5, epi x6, and amio  On arrival pt was moved over to our bed and cpr was continued.  Pt was given epi/ca/bicarb immediately on arrival.  At first pulse check, pt seemed to be in vtach, no pulse, cardiac standstill on bedside echo.  Patient was defibrilated and cpr was continued.  Patient also with PPM and magnet was placed.  cpr was continued through additional rounds of cpr with continued refractory vtach/vfib.  Mult rounds of acls including dual sequential defib with two machines w/o pulse.  Patient continued to have no cardiac motion at all on bedside echo.  istat labs had shown hyperkalemia at 6.1 but pt had been given both bicarb and ca w/o any changes.  After approx 1 hour total of cpr w/ no reversible causes of arrest  identified, the decision was made to end resuscitative efforts given no meaningful chance of rosc or recovery.  I feel this arrest was most likely 2/2 ACS event.  The family was notified of the patients passing.  The patients primary care doctor was made aware of the patients passing and will fill out the death certificate.    Silas Flood, MD 2015-02-16 2359  Blane Ohara, MD 02/05/15 [phone]

## 2015-02-02 DEATH — deceased

## 2015-02-16 ENCOUNTER — Ambulatory Visit: Payer: Medicare Other | Admitting: Cardiovascular Disease

## 2015-02-22 IMAGING — CR DG CHEST 2V
1 series · 1 of 1 positions shown · non-contrast
Comparison: Prior radiograph 12/24/2012

CLINICAL DATA: Chest pain A air

EXAM:
CHEST  2 VIEW

[view not recorded]
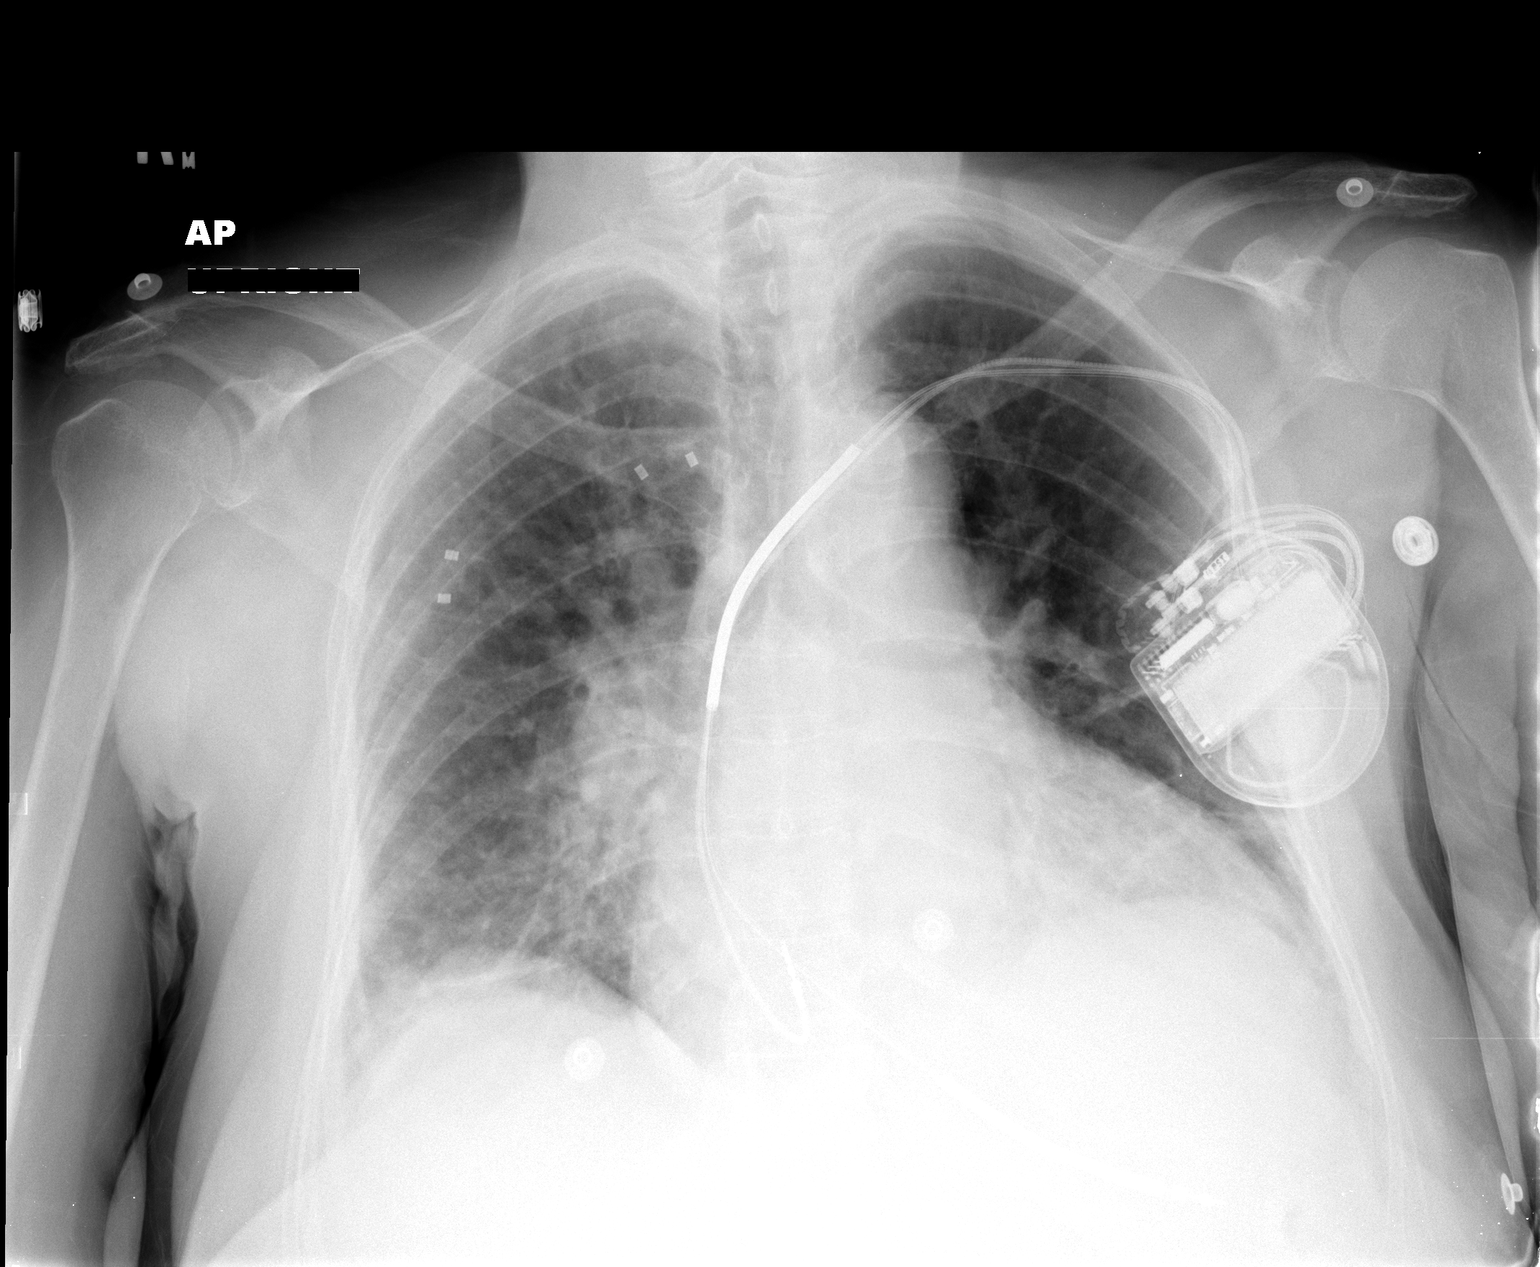

[1 of 1 positions shown; findings below may reference images not displayed]

FINDINGS: Left-sided pacemaker again noted.  Cardiomegaly is stable.

Lungs are mildly hypoinflated. There is diffuse pulmonary vascular
congestion with scattered Kerley B-lines, compatible with mild
pulmonary edema. Small bilateral pleural effusions are suspected. No
definite focal infiltrates. No pneumothorax.

Osseous structures are unchanged.
IMPRESSION: Cardiomegaly with mild diffuse pulmonary edema. Small bilateral
pleural effusions are suspected.

## 2015-02-26 IMAGING — CR DG CHEST 2V
2 series · 2 of 2 positions shown · non-contrast
Comparison: 02/05/2013

CLINICAL DATA: Shortness of breath and weakness

EXAM:
CHEST  2 VIEW

[w chest lat]
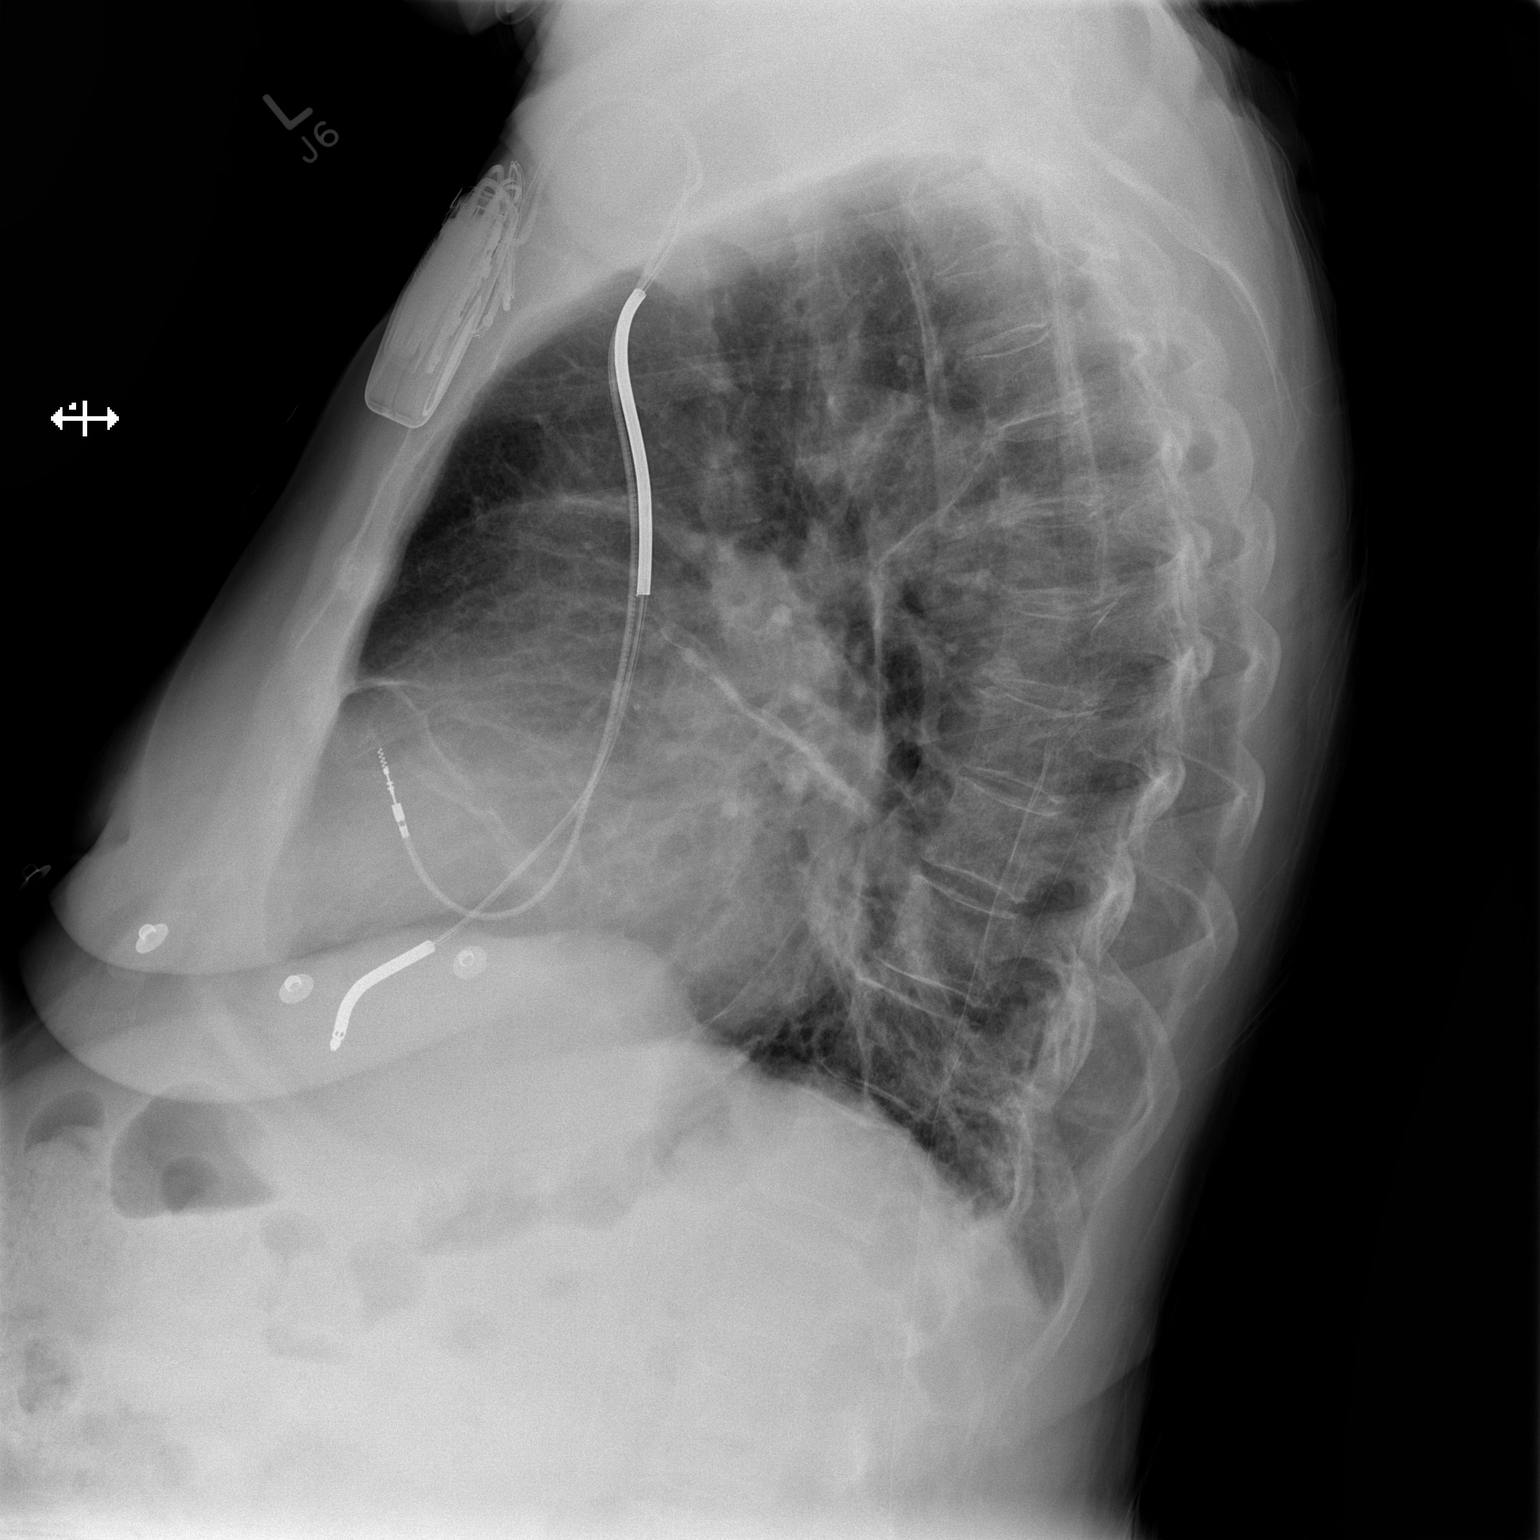

[x chest ap]
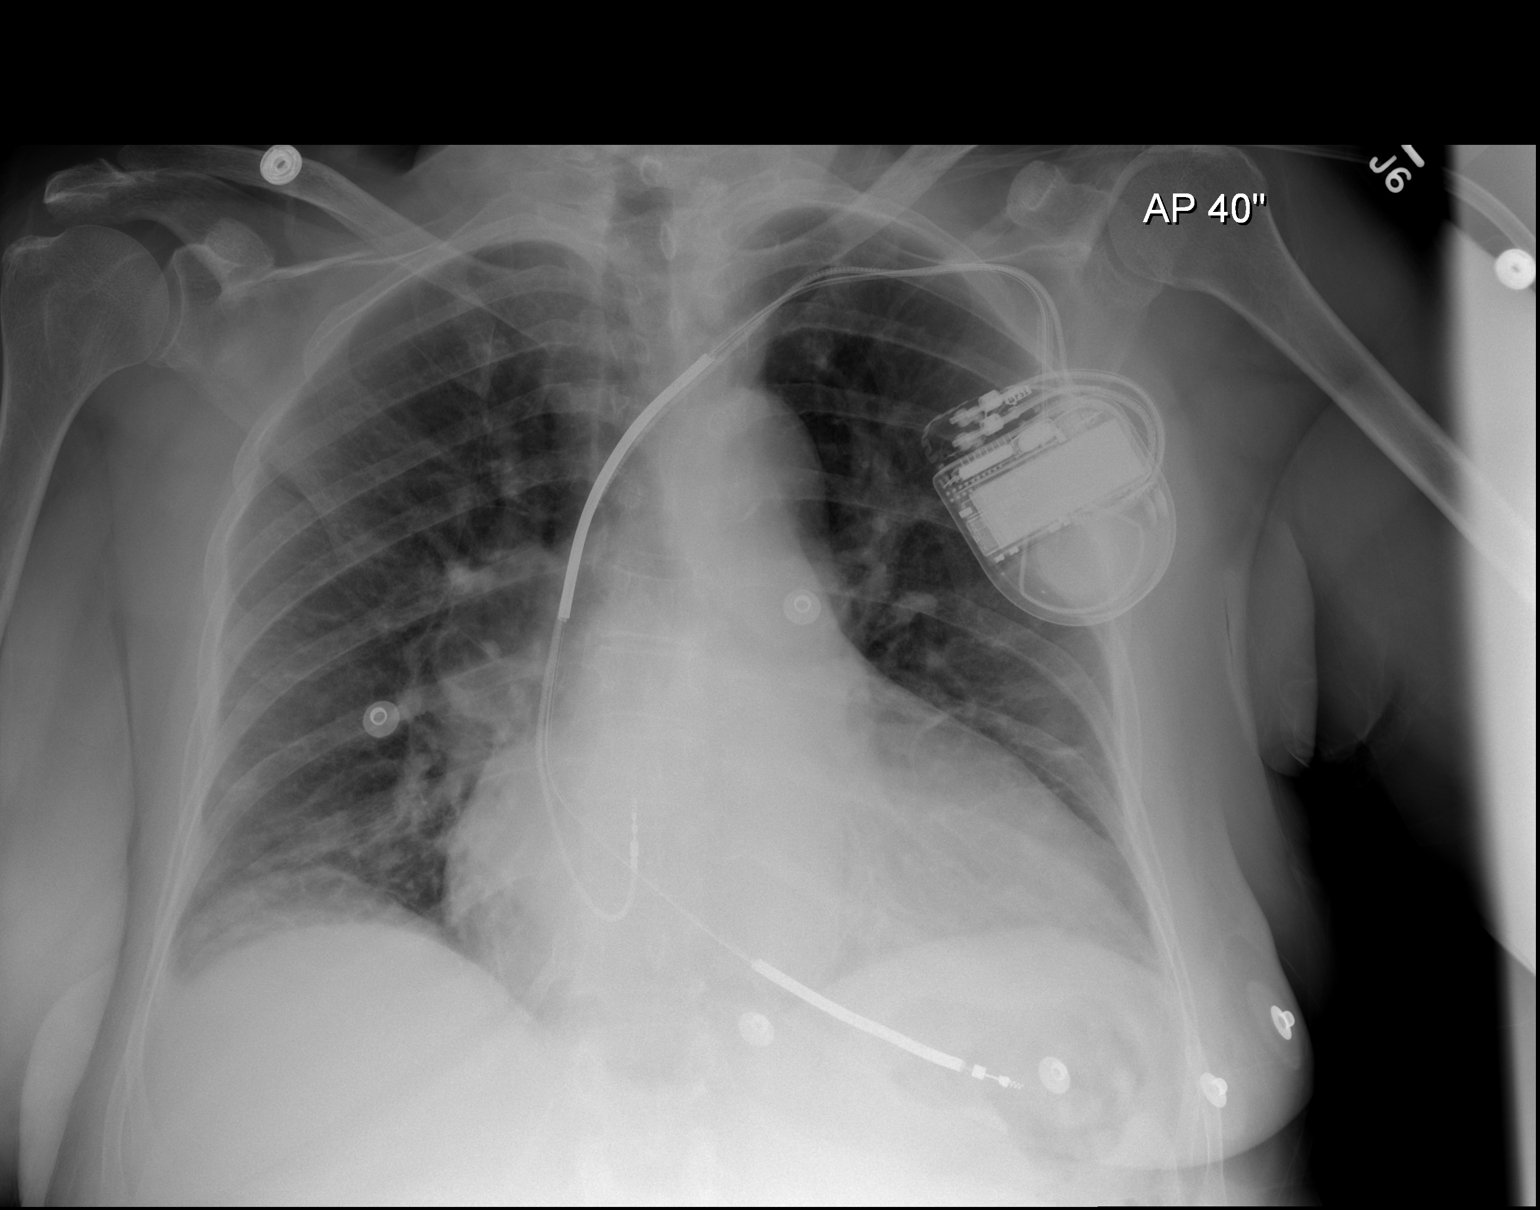

[2 of 2 positions shown; findings below may reference images not displayed]

FINDINGS: Cardiac shadow remains enlarged. A defibrillator is again seen.
Lungs are well aerated with a mild bibasilar atelectatic changes.
The overall appearance is stable from the previous exam. No focal
confluent infiltrate is seen. .
IMPRESSION: Stable bibasilar atelectatic changes.

## 2015-03-11 ENCOUNTER — Encounter: Payer: Self-pay | Admitting: *Deleted

## 2015-04-11 IMAGING — CR DG CHEST 2V
1 series · 1 of 1 positions shown · non-contrast
Comparison: 03/24/2013

CLINICAL DATA: Near syncope.  Left chest pain.

EXAM:
CHEST  2 VIEW

[view not recorded]
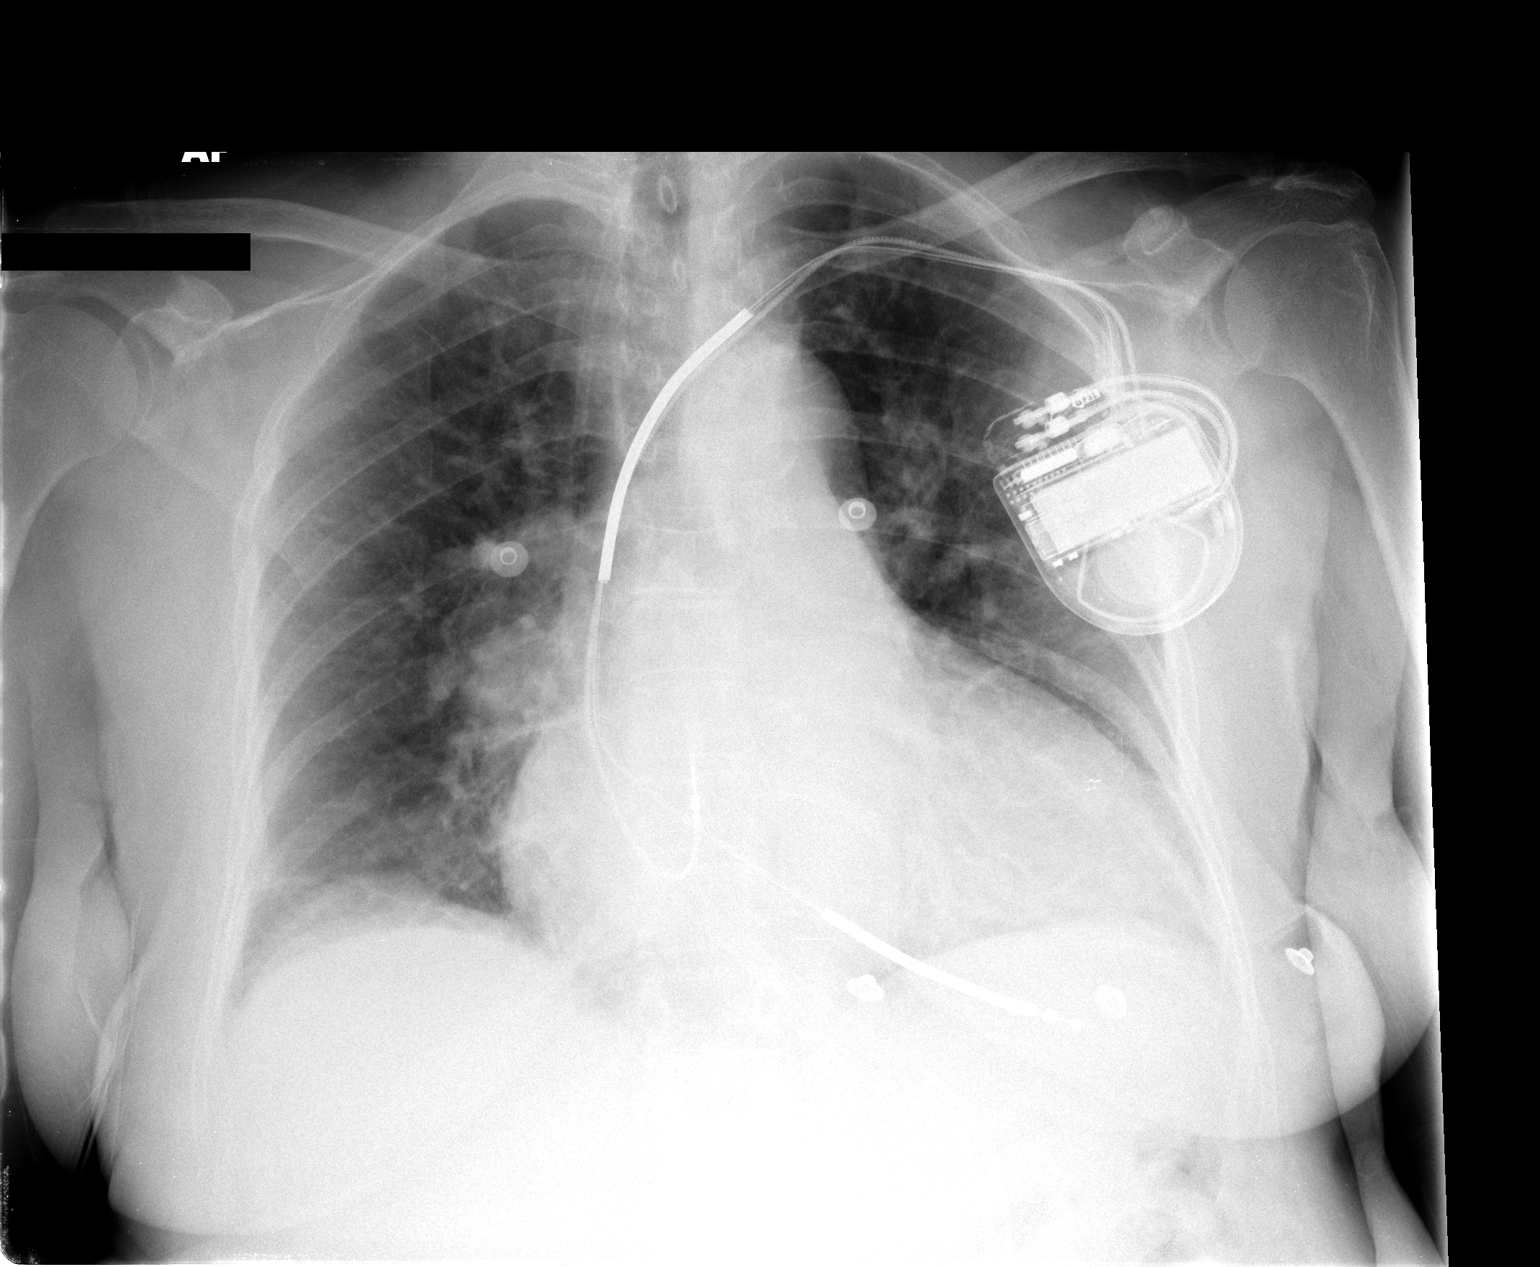

[1 of 1 positions shown; findings below may reference images not displayed]

FINDINGS: Left pacer/AICD remains in place, unchanged. Cardiomegaly. Lungs are
clear. No effusions. No acute bony abnormality.
IMPRESSION: Cardiomegaly.  No active disease.
